# Patient Record
Sex: Female | Born: 1955
Health system: Southern US, Community
[De-identification: ages and names within clinical notes are randomized; demographics above are authoritative.]

## PROBLEM LIST (undated history)

## (undated) DIAGNOSIS — F419 Anxiety disorder, unspecified: Secondary | ICD-10-CM

## (undated) DIAGNOSIS — I10 Essential (primary) hypertension: Secondary | ICD-10-CM

## (undated) DIAGNOSIS — M1990 Unspecified osteoarthritis, unspecified site: Secondary | ICD-10-CM

## (undated) DIAGNOSIS — F329 Major depressive disorder, single episode, unspecified: Secondary | ICD-10-CM

## (undated) DIAGNOSIS — G629 Polyneuropathy, unspecified: Secondary | ICD-10-CM

## (undated) DIAGNOSIS — F32A Depression, unspecified: Secondary | ICD-10-CM

## (undated) DIAGNOSIS — E785 Hyperlipidemia, unspecified: Secondary | ICD-10-CM

## (undated) DIAGNOSIS — Z8679 Personal history of other diseases of the circulatory system: Secondary | ICD-10-CM

## (undated) DIAGNOSIS — I679 Cerebrovascular disease, unspecified: Secondary | ICD-10-CM

## (undated) DIAGNOSIS — I6523 Occlusion and stenosis of bilateral carotid arteries: Secondary | ICD-10-CM

## (undated) DIAGNOSIS — E11610 Type 2 diabetes mellitus with diabetic neuropathic arthropathy: Secondary | ICD-10-CM

## (undated) DIAGNOSIS — M797 Fibromyalgia: Secondary | ICD-10-CM

## (undated) DIAGNOSIS — Z8542 Personal history of malignant neoplasm of other parts of uterus: Secondary | ICD-10-CM

## (undated) HISTORY — DX: Hyperlipidemia, unspecified: E78.5

## (undated) HISTORY — DX: Personal history of malignant neoplasm of other parts of uterus: Z85.42

## (undated) HISTORY — DX: Cerebrovascular disease, unspecified: I67.9

## (undated) HISTORY — DX: Personal history of other diseases of the circulatory system: Z86.79

## (undated) HISTORY — DX: Essential (primary) hypertension: I10

## (undated) HISTORY — DX: Occlusion and stenosis of bilateral carotid arteries: I65.23

## (undated) HISTORY — PX: COLONOSCOPY: SHX174

## (undated) HISTORY — PX: KNEE ARTHROSCOPY: SUR90

## (undated) HISTORY — PX: REPLACEMENT TOTAL KNEE BILATERAL: SUR1225

---
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
## 1976-05-29 HISTORY — PX: TUBAL LIGATION: SHX77

## 1994-05-29 HISTORY — PX: THYROID CYST EXCISION: SHX2511

## 1998-05-29 HISTORY — PX: ABDOMINAL HYSTERECTOMY: SHX81

## 1998-05-29 HISTORY — PX: KNEE ARTHROSCOPY: SHX127

## 2000-02-23 ENCOUNTER — Encounter: Payer: Self-pay | Admitting: Gynecology

## 2000-02-23 ENCOUNTER — Ambulatory Visit (HOSPITAL_COMMUNITY): Admission: RE | Admit: 2000-02-23 | Discharge: 2000-02-23 | Payer: Self-pay | Admitting: Gynecology

## 2000-03-01 ENCOUNTER — Encounter (INDEPENDENT_AMBULATORY_CARE_PROVIDER_SITE_OTHER): Payer: Self-pay | Admitting: Specialist

## 2000-03-01 ENCOUNTER — Ambulatory Visit (HOSPITAL_COMMUNITY): Admission: RE | Admit: 2000-03-01 | Discharge: 2000-03-01 | Payer: Self-pay | Admitting: Gynecology

## 2000-04-04 ENCOUNTER — Ambulatory Visit: Admission: RE | Admit: 2000-04-04 | Discharge: 2000-04-04 | Payer: Self-pay | Admitting: Gynecology

## 2000-04-17 ENCOUNTER — Inpatient Hospital Stay (HOSPITAL_COMMUNITY): Admission: RE | Admit: 2000-04-17 | Discharge: 2000-04-19 | Payer: Self-pay | Admitting: Gynecology

## 2000-04-17 ENCOUNTER — Encounter (INDEPENDENT_AMBULATORY_CARE_PROVIDER_SITE_OTHER): Payer: Self-pay

## 2000-04-24 ENCOUNTER — Ambulatory Visit: Admission: RE | Admit: 2000-04-24 | Discharge: 2000-04-24 | Payer: Self-pay | Admitting: Gynecology

## 2000-05-04 ENCOUNTER — Inpatient Hospital Stay (HOSPITAL_COMMUNITY): Admission: AD | Admit: 2000-05-04 | Discharge: 2000-05-04 | Payer: Self-pay | Admitting: Gynecology

## 2000-05-07 ENCOUNTER — Inpatient Hospital Stay (HOSPITAL_COMMUNITY): Admission: AD | Admit: 2000-05-07 | Discharge: 2000-05-07 | Payer: Self-pay | Admitting: Obstetrics & Gynecology

## 2000-05-30 ENCOUNTER — Ambulatory Visit: Admission: RE | Admit: 2000-05-30 | Discharge: 2000-05-30 | Payer: Self-pay | Admitting: Gynecology

## 2000-09-24 ENCOUNTER — Emergency Department (HOSPITAL_COMMUNITY): Admission: EM | Admit: 2000-09-24 | Discharge: 2000-09-25 | Payer: Self-pay | Admitting: Emergency Medicine

## 2000-11-13 ENCOUNTER — Ambulatory Visit: Admission: RE | Admit: 2000-11-13 | Discharge: 2000-11-13 | Payer: Self-pay | Admitting: Gynecology

## 2000-11-13 ENCOUNTER — Other Ambulatory Visit: Admission: RE | Admit: 2000-11-13 | Discharge: 2000-11-13 | Payer: Self-pay | Admitting: Gynecology

## 2001-05-29 HISTORY — PX: CARPAL TUNNEL RELEASE: SHX101

## 2001-09-05 ENCOUNTER — Ambulatory Visit (HOSPITAL_COMMUNITY): Admission: RE | Admit: 2001-09-05 | Discharge: 2001-09-05 | Payer: Self-pay | Admitting: Orthopaedic Surgery

## 2001-09-24 ENCOUNTER — Ambulatory Visit (HOSPITAL_COMMUNITY): Admission: RE | Admit: 2001-09-24 | Discharge: 2001-09-24 | Payer: Self-pay | Admitting: Orthopaedic Surgery

## 2001-12-12 ENCOUNTER — Ambulatory Visit (HOSPITAL_COMMUNITY): Admission: RE | Admit: 2001-12-12 | Discharge: 2001-12-12 | Payer: Self-pay | Admitting: Orthopaedic Surgery

## 2001-12-17 ENCOUNTER — Encounter (HOSPITAL_COMMUNITY): Admission: RE | Admit: 2001-12-17 | Discharge: 2002-01-16 | Payer: Self-pay | Admitting: Orthopaedic Surgery

## 2002-01-18 ENCOUNTER — Encounter: Payer: Self-pay | Admitting: Emergency Medicine

## 2002-01-18 ENCOUNTER — Emergency Department (HOSPITAL_COMMUNITY): Admission: EM | Admit: 2002-01-18 | Discharge: 2002-01-18 | Payer: Self-pay | Admitting: Emergency Medicine

## 2002-06-26 ENCOUNTER — Encounter: Payer: Self-pay | Admitting: Emergency Medicine

## 2002-06-26 ENCOUNTER — Emergency Department (HOSPITAL_COMMUNITY): Admission: EM | Admit: 2002-06-26 | Discharge: 2002-06-26 | Payer: Self-pay | Admitting: Emergency Medicine

## 2003-12-05 ENCOUNTER — Emergency Department (HOSPITAL_COMMUNITY): Admission: EM | Admit: 2003-12-05 | Discharge: 2003-12-05 | Payer: Self-pay | Admitting: Emergency Medicine

## 2004-03-03 ENCOUNTER — Ambulatory Visit (HOSPITAL_COMMUNITY): Admission: RE | Admit: 2004-03-03 | Discharge: 2004-03-03 | Payer: Self-pay | Admitting: Family Medicine

## 2004-06-10 ENCOUNTER — Ambulatory Visit (HOSPITAL_COMMUNITY): Admission: RE | Admit: 2004-06-10 | Discharge: 2004-06-10 | Payer: Self-pay | Admitting: Pediatrics

## 2004-06-22 ENCOUNTER — Ambulatory Visit (HOSPITAL_COMMUNITY): Admission: RE | Admit: 2004-06-22 | Discharge: 2004-06-22 | Payer: Self-pay | Admitting: Pediatrics

## 2004-07-11 ENCOUNTER — Inpatient Hospital Stay (HOSPITAL_COMMUNITY): Admission: RE | Admit: 2004-07-11 | Discharge: 2004-07-14 | Payer: Self-pay | Admitting: Orthopedic Surgery

## 2004-08-01 ENCOUNTER — Encounter (HOSPITAL_COMMUNITY): Admission: RE | Admit: 2004-08-01 | Discharge: 2004-08-31 | Payer: Self-pay | Admitting: Orthopedic Surgery

## 2004-08-09 ENCOUNTER — Emergency Department (HOSPITAL_COMMUNITY): Admission: EM | Admit: 2004-08-09 | Discharge: 2004-08-09 | Payer: Self-pay | Admitting: Emergency Medicine

## 2004-08-10 ENCOUNTER — Ambulatory Visit: Payer: Self-pay | Admitting: Psychiatry

## 2004-09-02 ENCOUNTER — Inpatient Hospital Stay (HOSPITAL_COMMUNITY): Admission: RE | Admit: 2004-09-02 | Discharge: 2004-09-06 | Payer: Self-pay | Admitting: Orthopedic Surgery

## 2004-09-26 ENCOUNTER — Encounter (HOSPITAL_COMMUNITY): Admission: RE | Admit: 2004-09-26 | Discharge: 2004-10-26 | Payer: Self-pay | Admitting: Orthopedic Surgery

## 2004-12-11 ENCOUNTER — Emergency Department (HOSPITAL_COMMUNITY): Admission: EM | Admit: 2004-12-11 | Discharge: 2004-12-11 | Payer: Self-pay | Admitting: Emergency Medicine

## 2005-04-29 ENCOUNTER — Observation Stay (HOSPITAL_COMMUNITY): Admission: EM | Admit: 2005-04-29 | Discharge: 2005-04-29 | Payer: Self-pay | Admitting: Emergency Medicine

## 2005-05-04 ENCOUNTER — Ambulatory Visit (HOSPITAL_COMMUNITY): Admission: RE | Admit: 2005-05-04 | Discharge: 2005-05-04 | Payer: Self-pay | Admitting: Pediatrics

## 2005-05-04 ENCOUNTER — Ambulatory Visit: Payer: Self-pay | Admitting: Cardiology

## 2005-05-29 HISTORY — PX: HERNIA REPAIR: SHX51

## 2005-05-29 HISTORY — PX: CHOLECYSTECTOMY: SHX55

## 2005-09-29 ENCOUNTER — Ambulatory Visit (HOSPITAL_COMMUNITY): Admission: RE | Admit: 2005-09-29 | Discharge: 2005-09-29 | Payer: Self-pay | Admitting: General Surgery

## 2005-10-11 ENCOUNTER — Encounter (HOSPITAL_COMMUNITY): Admission: RE | Admit: 2005-10-11 | Discharge: 2005-11-10 | Payer: Self-pay | Admitting: General Surgery

## 2005-10-30 ENCOUNTER — Observation Stay (HOSPITAL_COMMUNITY): Admission: RE | Admit: 2005-10-30 | Discharge: 2005-10-31 | Payer: Self-pay | Admitting: General Surgery

## 2005-10-30 ENCOUNTER — Encounter (INDEPENDENT_AMBULATORY_CARE_PROVIDER_SITE_OTHER): Payer: Self-pay | Admitting: *Deleted

## 2005-11-09 ENCOUNTER — Emergency Department (HOSPITAL_COMMUNITY): Admission: EM | Admit: 2005-11-09 | Discharge: 2005-11-09 | Payer: Self-pay | Admitting: Emergency Medicine

## 2006-05-10 ENCOUNTER — Observation Stay (HOSPITAL_COMMUNITY): Admission: EM | Admit: 2006-05-10 | Discharge: 2006-05-11 | Payer: Self-pay | Admitting: Emergency Medicine

## 2006-05-10 ENCOUNTER — Ambulatory Visit: Payer: Self-pay | Admitting: Internal Medicine

## 2006-06-29 ENCOUNTER — Ambulatory Visit (HOSPITAL_COMMUNITY): Admission: RE | Admit: 2006-06-29 | Discharge: 2006-06-29 | Payer: Self-pay | Admitting: General Surgery

## 2006-10-19 ENCOUNTER — Observation Stay (HOSPITAL_COMMUNITY): Admission: RE | Admit: 2006-10-19 | Discharge: 2006-10-20 | Payer: Self-pay | Admitting: General Surgery

## 2006-12-03 ENCOUNTER — Ambulatory Visit (HOSPITAL_COMMUNITY): Admission: RE | Admit: 2006-12-03 | Discharge: 2006-12-03 | Payer: Self-pay | Admitting: Family Medicine

## 2006-12-07 ENCOUNTER — Ambulatory Visit (HOSPITAL_COMMUNITY): Admission: RE | Admit: 2006-12-07 | Discharge: 2006-12-07 | Payer: Self-pay | Admitting: General Surgery

## 2007-02-06 ENCOUNTER — Ambulatory Visit (HOSPITAL_COMMUNITY): Admission: RE | Admit: 2007-02-06 | Discharge: 2007-02-06 | Payer: Self-pay | Admitting: Family Medicine

## 2007-02-13 ENCOUNTER — Ambulatory Visit (HOSPITAL_COMMUNITY): Admission: RE | Admit: 2007-02-13 | Discharge: 2007-02-13 | Payer: Self-pay | Admitting: Family Medicine

## 2007-08-21 IMAGING — CR DG CHEST 1V PORT
1 series · 1 of 1 positions shown · non-contrast
Comparison: 12/05/03.

CLINICAL DATA: Atrial fibrillation. 
 PORTABLE CHEST - 1 VIEW - 04/28/05:

[view not recorded]
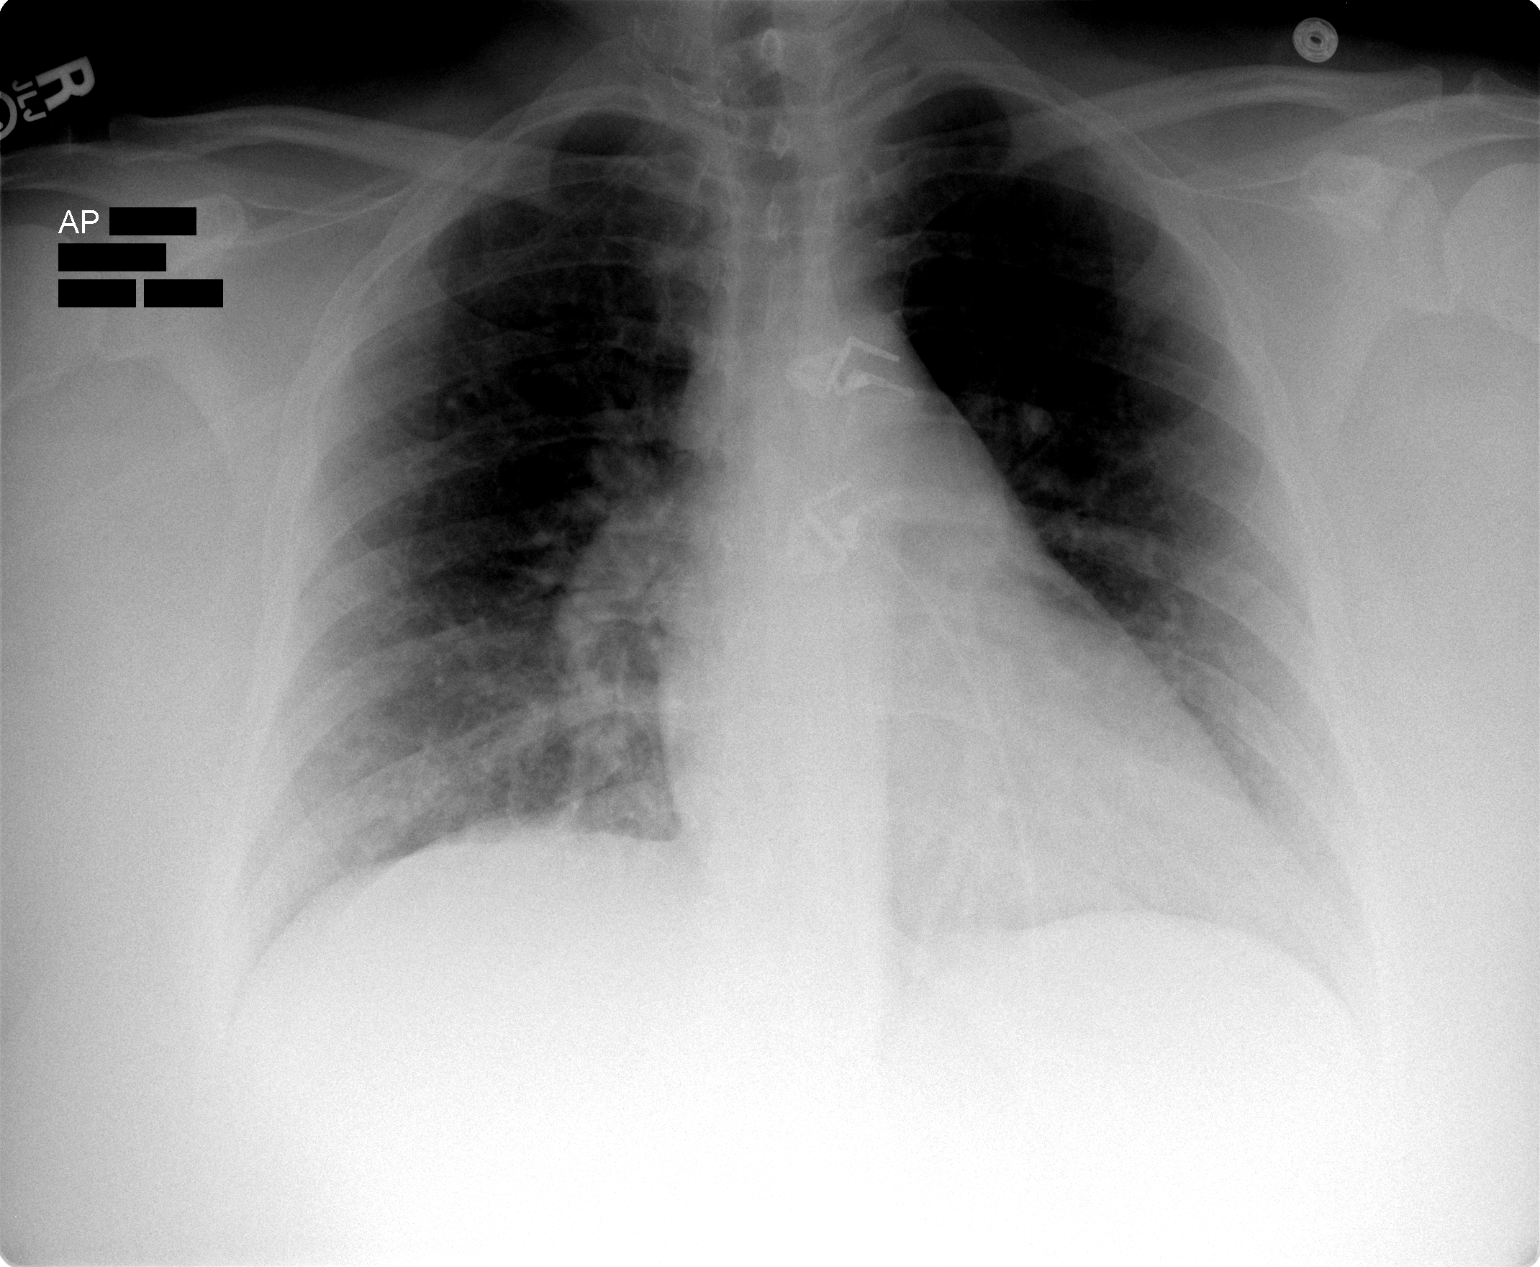

[1 of 1 positions shown; findings below may reference images not displayed]

FINDINGS: AP film at 1141 hours shows a stable cardiopericardial silhouette which is upper limits of normal.  There is mild vascular congestion without overt edema or focal consolidation.  Telemetry leads overlie the chest.
IMPRESSION: Borderline cardiomegaly with mild vascular congestion.

## 2007-11-04 ENCOUNTER — Ambulatory Visit (HOSPITAL_COMMUNITY): Admission: RE | Admit: 2007-11-04 | Discharge: 2007-11-04 | Payer: Self-pay | Admitting: Pediatrics

## 2009-07-08 ENCOUNTER — Ambulatory Visit (HOSPITAL_COMMUNITY): Admission: RE | Admit: 2009-07-08 | Discharge: 2009-07-08 | Payer: Self-pay | Admitting: Surgery

## 2010-10-14 NOTE — Consult Note (Signed)
[facility]  Patient:    [name], [name]                 MRN: [id number] Proc. Date: 04/24/00 Adm. Date:  [id number] Attending:  Jeannette Corpus CC:         Roseanna Rainbow, M.D.  Leatha Gilding. Mezer, M.D.  Telford Nab, R.N.   Consultation Report  HISTORY:  [age]-year-old white female returns following TAH/BSO a week ago for a grade 1 endometrial carcinoma.  Final pathology showed this was superficial and noninvasive (stage IA).  Patient has had an uncomplicated postoperative course.  She denies any nausea or vomiting.  Her appetite is good.  She is having some intermittent constipation.  She has had no wound problems.  PHYSICAL EXAMINATION  ABDOMEN:  The abdomen is soft and nontender.  Midline incision is healing well.  Staples are removed and Steri-Strips are applied.  IMPRESSION:  Stage IA, grade 1 endometrial carcinoma.  The patient is having considerable hot flushes and I think with the low risk of recurrence, the patient will be given a prescription for Premarin 0.9 mg daily.  She is given a new prescription for Percocet (30) for postoperative pain.  She will return in one week to have her subcutaneous retention sutures removed. DD:  04/24/00 TD:  04/25/00 Job: [id number]

## 2010-10-14 NOTE — Consult Note (Signed)
[facility]  Patient:    [name], [name]                   MRN: [id number] Proc. Date: 11/13/00 Adm. Date:  [id number] Attending:  Jeannette Corpus CC:         Leatha Gilding. Mezer, M.D.  Roseanna Rainbow, M.D.  Telford Nab, R.N.  Roylene Reason. Lisette Grinder, M.D. [address] [place], [place]  [address]   Consultation Report  HISTORY OF PRESENT ILLNESS:  [age]-year-old white female returns for continuing followup of a stage IA, grade 1 endometrial cancer, undergoing initial surgery in November of 2001.  Since her last visit, she has done well. She denies any GI or GU symptoms, has no pelvic pain, pressure, vaginal bleeding or discharge.  She is working full-time.  She failed to make her followup appointment with Dr. Lisette Grinder three months ago.  REVIEW OF SYSTEMS:  Essentially negative.  FAMILY HISTORY AND SOCIAL HISTORY:  Reviewed and unchanged from previous notations.  PHYSICAL EXAMINATION:  VITAL SIGNS:  Weight 350 pounds.  Blood pressure 110/78.  GENERAL:  The patient is an obese white female in no acute distress.  HEENT:  Negative.  NECK:  Supple without thyromegaly.  There is no supraclavicular or inguinal adenopathy.  ABDOMEN:  Soft, obese, nontender.  No mass, organomegaly, ascites or herniae are noted.  Her midline incision is well-healed.  PELVIC:  EGBUS normal.  Vagina is clean, well-supported.  No lesions are noted.  Bimanual and rectovaginal exam revealed no mass, induration or nodularity.  IMPRESSION:  Stage IA, grade 1 endometrial cancer, no evidence of recurrent disease.  PLAN:  Pap smears are obtained today.  The patient will return to see her primary gynecologist, Dr. Roylene Reason. Lisette Grinder, in three months and return to see me in six months. DD:  11/13/00 TD:  11/14/00 Job: [id number]

## 2010-10-14 NOTE — Group Therapy Note (Signed)
   NAMEEVON, DEJARNETT                        ACCOUNT NO.:  [id number]   MEDICAL RECORD NO.:  [id number]                  PATIENT TYPE:   LOCATION:                                       FACILITY:   PHYSICIAN:  Fredirick Maudlin, M.D.              DATE OF BIRTH:   DATE OF PROCEDURE:  DATE OF DISCHARGE:                                   PROGRESS NOTE   ELECTROCARDIOGRAM:  (Time 1138 on January 18, 2002)  The rhythm is sinus rhythm with a rate of 70. There are non-specific T wave  abnormalities which are diffuse.   INTERPRETATION:  Minimally abnormal electrocardiogram.                                               Fredirick Maudlin, M.D.    ELH/MEDQ  D:  01/19/2002  T:  01/19/2002  Job:  [id number]

## 2010-10-14 NOTE — Op Note (Signed)
NAMEJAZZMINE, [name]            ACCOUNT NO.:  [id number]   MEDICAL RECORD NO.:  [id number]          PATIENT TYPE:  AMB   LOCATION:  DAY                           FACILITY:  [facility]   PHYSICIAN:  Dalia Heading, M.D.  DATE OF BIRTH:  [birth year]   DATE OF PROCEDURE:  DATE OF DISCHARGE:                               OPERATIVE REPORT   PREOPERATIVE DIAGNOSIS:  Recurrent incisional hernia.   POSTOPERATIVE DIAGNOSIS:  Recurrent incisional hernia.   OPERATION PERFORMED:  Recurrent incisional herniorrhaphy with mesh.   SURGEON:  Dalia Heading, M.D.   ANESTHESIA:  General endotracheal.   INDICATIONS:  The patient is a 55 year old, obese white female who  presents with a recurrent incisional hernia.  This is occurring in the  upper portion of the abdomen to the left of the previous hernia repair.  The risks and benefits of the procedure including, bleeding, infection,  and the possible recurrence of the hernia were fully explained to the  patient.  He gave informed consent.   PROCEDURE:  The patient was placed in supine position.  After induction  of general endotracheal anesthesia, the abdomen was prepped and draped  using the usual sterile technique with  Betadine.  Surgical site  confirmation was performed.   A  midline incision was made through the previous surgical scar. This  scar was excised without difficulty.  The dissection was taken down to  the hernia.  The previous polypropylene mesh repair was found.  It  appeared that the left side muscle layer and fascia had pulled away from  the mesh.  The large hernia sac was found and this was excised without  difficulty.  The left sided fascia was found.  A 3 x 5 cm defect was  closed using polypropylene mesh.  It was secured into place with both a  running O-Novofil suture as well as interrupted O-Prolene sutures.  A  #10 flat Jackson-Pratt drain was placed over this region and brought out  through a separate stab wound inferior  to the midline incision.  The  subcutaneous layer was reapproximated using 2-0 and 3-0 Vicryl in  interrupted sutures.  The skin was closed using staples.  A 0.5%  Sensorcaine was installed in the surrounding wound.  Betadine ointment  and dry sterile dressing were applied.   All tape and needle counts were correct at the end of the procedure.  The patient was extubated in the operating room and went back to  recovery room awake and in stable condition.   COMPLICATIONS:  None.   SPECIMENS:  None.   ESTIMATED BLOOD LOSS:  Less than 100 ml.   DRAINS:  Jackson-Pratt drain in the subcutaneous tissue.      Dalia Heading, M.D.  Electronically Signed     MAJ/MEDQ  D:  10/19/2006  T:  10/19/2006  Job:  [id number]   cc:   Dalia Heading, M.D.  Fax: [phone]

## 2010-10-14 NOTE — Consult Note (Signed)
[facility]  Patient:    [name], [name]                 MRN: [id number] Proc. Date: 05/30/00 Adm. Date:  [id number] Attending:  Jeannette Corpus CC:         Leatha Gilding. Mezer, M.D.  Roseanna Rainbow, M.D.  Telford Nab, R.N.  Roylene Reason. Lisette Grinder, M.D., [facility], Dept. of OB/GYN, [place], [place] [address]                          Consultation Report  HISTORY:  [age]-year-old white female returns for six-week postoperative checkup, having undergone a TAH/BSO for grade 1 endometrial carcinoma.  Final pathology showed that she had a superficial and essentially noninvasive adenocarcinoma (stage IA).  Despite her obesity, she had an uncomplicated postoperative course and had good wound healing.  Since her last visit, she has done well.  She has no pain or pressure. Physical activity and energy level are nearly normal.  REVIEW OF SYSTEMS:  Review of systems otherwise negative.  PHYSICAL EXAMINATION  GENERAL:  Physical exam reveals an obese white female in no acute distress.  ABDOMEN:  The abdomen is obese, soft, nontender.  No mass, organomegaly, ascites or herniae are noted.  Her incisions are healing well.  There are two areas of eschar present from very superficial separation.  PELVIC:  EG/BUS normal.  Vagina is clean, well-supported.  Cuff is healing well.  Bimanual and rectovaginal exam revealed no mass, induration or nodularity.  IMPRESSION:  Stage IA, grade 1 endometrial cancer.  We will have the patient return to see her primary gynecologist, Dr. Roylene Reason. Lisette Grinder, in three months and return to see me three months thereafter in followup. DD:  05/30/00 TD:  05/31/00 Job: [id number]

## 2010-10-14 NOTE — Consult Note (Signed)
[name], [name]            ACCOUNT NO.:  [id number]   MEDICAL RECORD NO.:  [id number]          PATIENT TYPE:  OBV   LOCATION:  A202                          FACILITY:  [facility]   PHYSICIAN:  Pricilla Riffle, MD, FACCDATE OF BIRTH:  12/15/1955   DATE OF CONSULTATION:  05/10/2006  DATE OF DISCHARGE:                                 CONSULTATION   REFERRING SERVICE:  [facility].   IDENTIFICATION:  [name] is a 55 year old woman who we are asked to  see regarding chest pain.   HISTORY:  The patient has no known history of coronary artery disease  and had a negative stress test in 1999, complaining of chest pain that  began last night.  It occurred in her shoulder blades and went to the  front of her chest, occasionally pleuritic, worse with moving, notes  moving furniture recently.  Denies PND.  No palpitations.  Seen by Dr.  Oneta Rack.  Labs:  Troponin 0.07.  Planned for admit.   The patient has been active, no change, no chest pain prior.   ALLERGIES:  None.   PAST MEDICAL HISTORY:  1. Diabetes, diagnosed in 2000.  Hemoglobin A1c in July was 9.62.  2. Dyslipidemia, on lovastatin 20 mg.  On last lipid panel in July,      her LDL was 139, HDL of 42, triglycerides of 195.  3. Tobacco use.  4. Hypertension.  5. __________.  6. Uterine CA, status post removal.  7. Paroxysmal atrial fibrillation; the patient says she senses, has      had 2 spells, last one this year.  8. DJD.  9. Depression.  10.Obesity.   SOCIAL HISTORY:  The patient is a Engineer, civil (consulting).  She has 3 children and smokes.   FAMILY HISTORY:  Positive for MI, significantly 3 relatives in their  22s.   REVIEW OF SYSTEMS:  All systems reviewed and negative to the above  problem except as noted.   PHYSICAL EXAM:  GENERAL:  On exam, the patient is in no acute distress,  pain noted with deep breath.  VITAL SIGNS:  Heart rate 80, blood pressure not taken, respiratory rate  16.  HEENT:  Normocephalic, atraumatic.   EOMI, PERRL.  Throat clear.  NECK:  JVP is normal.  No bruits.  No thyromegaly.  LUNGS:  Clear.  CARDIAC:  Regular rate and rhythm.  S1 and S2.  No S3, S4 or murmurs.  BACK:  Tender to percussion in left scapula.  ABDOMEN:  Benign.  EXTREMITIES:  Pulses 2+.  No lower extremity edema.  NEUROLOGIC:  Alert and oriented x3.  Cranial nerves II-XII grossly  intact.  Motor 5/5 throughout.   LABORATORY DATA:  Hemoglobin of 16, WBC of 9.9.  MB fraction of 2.3,  less than 1.  Troponin less than 0.05, 0.07.  BNP 105.   EKG:  Normal sinus rhythm at 61 beats per minute.   IMPRESSION:  The patient is a 55 year old female with history of  diabetes, hypertension, continued tobacco use, family history,  dyslipidemia, now with chest pain.  Pain atypical for cardiac ischemia,  appears more  musculoskeletal, but troponin slightly positive at 0.07.   PLAN/RECOMMENDATION:  Continue to follow, treat pain.  If no continued  trend positive, stress Myoview; if continues to increase, cath.  Counseled on weight, tobacco and diabetes.  Check lipids.  With history  of paroxysmal atrial fibrillation, will need to follow.  Consider echo,  not now.      Pricilla Riffle, MD, [facility]  Electronically Signed     PVR/MEDQ  D:  05/10/2006  T:  05/11/2006  Job:  [id number]

## 2010-10-14 NOTE — H&P (Signed)
NAMEYURIANA, [name]            ACCOUNT NO.:  [id number]   MEDICAL RECORD NO.:  [id number]          PATIENT TYPE:  INP   LOCATION:  A213                          FACILITY:  [facility]   PHYSICIAN:  Scott A. Gerda Diss, MD    DATE OF BIRTH:  01-24-1956   DATE OF ADMISSION:  04/28/2005  DATE OF DISCHARGE:  LH                                HISTORY & PHYSICAL   CHIEF COMPLAINT:  Shortness of breath, fluttering of the heart.   HISTORY OF PRESENT ILLNESS:  This is a 55 year old white female who earlier  today felt like her heart was flip-flopping, then felt like it was running  fast.  She came to the hospital.  She states that she was checked and found  to be in atrial fibrillation with rapid ventricular response.  So she came  on here, to the ER to be further treated. While here in the ER they gave her  Cardizem that dropped her blood pressure but did block her down into a  junctional rhythm.  The patient denied any chest tightness, pressure or  pain; felt a little bit short of breath, slightly nauseated; but no  vomiting, no diarrhea, no sweats or chills.  Denies any sharp chest pains or  severe chest tightness.  She states that she gets a little short of breath  with activity, but she states that that is normal for her.  She denies PND,  excessive swelling in the legs.  She states that her energy level overall is  good.   PAST MEDICAL HISTORY:  Diabetes, obesity, stress anxiety, and sleep  disorder.  Also includes 1 episode of atrial fibrillation with rapid  ventricular response.  She states that she had normal workup, at that time,  including a thallium test.  It should be noted that the initial EKG showed  some ST segment depression in lead 2.  Chest x-ray showed vascular  congestion and cardiomegaly.   SOCIAL HISTORY:  Smokes.   FAMILY HISTORY:  Heart disease in dad, occurred in the 59s.   PHYSICAL EXAMINATION:  HEENT:  Benign.  NECK:  No abnormal JVD.  CHEST:  CTA.  No crackles.  HEART:  Regular currently.  ABDOMEN:  Soft.  EXTREMITIES:  No edema.  SKIN:  Warm and dry.   Cardiac enzymes negative.  MET-7 overall looks good.  CBC looks fine.  BNP  pending.   ASSESSMENT AND PLAN:  Atrial fibrillation with rapid ventricular response --  admit patient in observation, watch overnight, repeat EKG in the morning.  If she spontaneously converts there is nothing else needed to be done  currently.  We will check a BNP and also give her some Lasix, recheck BNP in  the morning.  Cardiac monitors two additional times.  If the patient  persists with this, will probably need echo and cardiac consult.  She has  seen [facility] in the past.      Scott A. Gerda Diss, MD  Electronically Signed     SAL/MEDQ  D:  04/29/2005  T:  04/29/2005  Job:  [id number]

## 2010-10-14 NOTE — Op Note (Signed)
[facility]  Patient:    [name], [name] Visit Number: [id number] MRN: [id number]          Service Type: DSU Location: DAY Attending Physician:  Windle Guard Dictated by:   Darreld Mclean, M.D. Proc. Date: 09/05/01 Admit Date:  09/05/2001                             Operative Report  PREOPERATIVE DIAGNOSIS:   Left carpal tunnel syndrome.  POSTOPERATIVE DIAGNOSIS:  Left carpal tunnel syndrome.  OPERATION:  Release of left volar carpal ligament, saline neurolysis, epineurotomy, left median nerve.  SURGEON:  Darreld Mclean, M.D.  ASSISTANT:  Candace Cruise, P.A.-C.  ANESTHESIA:  Bier block.  TOURNIQUET TIME:  Please refer to anesthesia record for tourniquet time.  DRAINS:  None.  SPLINTS:  Volar plasty splint.  INDICATIONS: The patient is a 55 year old female with pain and tenderness in the left median nerve.  Nerve conduction studies were positive for carpal tunnel syndrome on the left.  The patient has not improved with conservative treatment.  Surgery is now recommended.  Risks and opponens of the procedure have been discussed preoperatively.  DESCRIPTION OF PROCEDURE: The patient was placed supine on the operating room table with hand table attached.  The patient was given bier block anesthesia. Once this was obtained, the patient was prepped and draped in the usual manner.  Overlying incision was made and with careful dissection the median nerve was identified proximally.  A vessel loop was placed around the nerve. A groove director was then placed within the carpal tunnel space.  Volar carpal ligament was then incised.  The nerve was obviously decompressed. Retinaculum cut proximally under the skin.  Saline neurolysis and epineurotomy carried out.  Specimen of volar carpal ligament sent to pathology.  Nerve inspection no apparent injury.  Wound reapproximated with 3-0 nylon interrupted vertical mattress manner.  Sterile dressing  applied.  Bulky dressing applied.  Sheet cotton applied.  Sheet cotton cut dorsally.  Volar plasty splint applied.  Ace bandage applied loosely.  DISPOSITION:  The patient was given Vicodin ES for pain.  See the patient in the office in approximately 10 days to two weeks.  She is to elevate her hand and move her fingers.  If she has any difficulty she is to contact me through the office or hospital beeper system. Dictated by:   Darreld Mclean, M.D. Attending Physician:  Windle Guard DD:  09/05/01 TD:  09/05/01 Job: [id number]

## 2010-10-14 NOTE — Op Note (Signed)
[name], [name]            ACCOUNT NO.:  [id number]   MEDICAL RECORD NO.:  [id number]          PATIENT TYPE:  OBV   LOCATION:  A304                          FACILITY:  [facility]   PHYSICIAN:  Dalia Heading, M.D.  DATE OF BIRTH:  07-14-1955   DATE OF PROCEDURE:  10/30/2005  DATE OF DISCHARGE:                                 OPERATIVE REPORT   PREOPERATIVE DIAGNOSIS:  Chronic cholecystitis, incisional hernia.   POSTOPERATIVE DIAGNOSIS:  Chronic cholecystitis, incisional hernia.   PROCEDURE:  Laparoscopic cholecystectomy, incisional herniorrhaphy with  mesh.   SURGEON:  Dr. Franky Macho.   ASSISTANT:  Dr. Arna Snipe.   ANESTHESIA:  General endotracheal.   INDICATIONS:  The patient is a 55 year old morbidly obese white female who  presents with both biliary colic secondary to chronic cholecystitis, and an  upper midline incisional hernia.  The risks and benefits of both procedures,  including bleeding, infection, recurrence of the hernia, hepatobiliary  injury, and cardiopulmonary difficulties were fully explained to the  patient, who gave informed consent.   PROCEDURE NOTE:  The patient was placed in the supine position.  After  induction of general endotracheal anesthesia, the abdomen was prepped and  draped, using the usual sterile technique with Betadine.  Surgical site  confirmation was performed.   A supraumbilical incision was made down to the fascia.  Veress needle was  introduced into the abdominal cavity and confirmation of placement was done  using the saline drop test.  The abdomen was then insufflated to 16 mmHg  pressure.  An 11-mm trocar was introduced into the abdominal cavity, under  direct visualization without difficulty.  The patient was placed in reversed  Trendelenburg position.  An additional 11-mm trocar was placed in the  epigastric region and 5-mm trocars were placed in the right upper quadrant  and right flank regions.  Liver was inspected  and noted to normal limits.  The gallbladder was retracted superiorly and laterally.  The dissection was  begun around the infundibulum of the gallbladder.  The cystic duct was first  identified.  Its juncture to the infundibulum was fully identified, and  clips placed proximally and distally on cystic duct, and cystic duct was  divided.  This was likewise done on the cystic artery.  The gallbladder then  freed away from the gallbladder fossa using Bovie electrocautery.  The  gallbladder was delivered through the epigastric trocar site using EndoCatch  bag without difficulty.  Gallbladder fossa was inspected.  No abnormal  bleeding or bile leakage was noted.  Surgicel was placed in the gallbladder  fossa.  All fluid and air were then evacuated from the abdominal cavity  prior to removal of the trocars.   Of note was the fact that the liver appeared to have fatty infiltration  present.  No discrete masses were noted.   Next, an incisional herniorrhaphy with mesh was performed.  The  supraumbilical incision was extended superiorly.  The dissection was taken  down to the fascia.  The patient was noted to have a 3 cm hernia defect  along the inner portion of the rectus  abdominis.  This was opened and the  peritoneal cavity was entered into.  Omentum was freed away sharply and  bluntly from the underside of the fascia.  A 3 x 6 cm piece of polypropylene  mesh was then secured circumferentially to the fascia using an 0 Prolene  running suture.  Single interrupted 0 Prolene sutures were also placed to  secure the repair.  The defect ultimately ended up being approximately 3 x 4  cm in size.  The subcutaneous layer was reapproximated using a 2-0 Vicryl in  interrupted sutures.  All skin incisions were closed using staples.  Betadine ointment and dry sterile dressings were applied.  Sensorcaine 0.5%  was instilled into the surrounding wounds.   All tape and needle counts were correct at the end  of the procedures.  The  patient was extubated in the operating room and went back to recovery room,  awake and in stable condition.   COMPLICATIONS:  None.   SPECIMEN:  Gallbladder.   BLOOD LOSS:  Minimal.      Dalia Heading, M.D.  Electronically Signed     MAJ/MEDQ  D:  10/30/2005  T:  10/30/2005  Job:  [id number]   cc:   Dalia Heading, M.D.  Fax: [phone]   Francoise Schaumann. Milford Cage DO, FAAP  Fax: [phone]

## 2010-10-14 NOTE — Consult Note (Signed)
[facility]  Patient:    [name], [name]                     MRN: [id number] Proc. Date: 04/04/00 Attending:  Rande Brunt. Clarke-Pearson, M.D. CC:         Leatha Gilding. Mezer, M.D.  Roseanna Rainbow, M.D.  Telford Nab, R.N.   Consultation Report  [age]-year-old white married female, referred by Dr. Leatha Gilding. Mezer for evaluation and management of a newly diagnosed grade 1 endometrial carcinoma. The patient had some abnormal bleeding, ultimately undergoing a hysteroscopic D&C on October 4th.  The findings showed an endometrial adenocarcinoma, grade 1.  Endocervical curettage was negative.  The patient has had a pelvic ultrasound which shows no other abnormalities and no free fluid. Transabdominal ultrasound was limited because of the patients obesity.  She has no other significant gynecologic history.  MEDICAL HISTORY:  Atrial fibrillation, October 1999, with spontaneous conversion.  Patient also has diabetes.  SURGERY:  Arthroscopies x 5, bilateral tubal ligation, resection of thyroid nodule.  CURRENT MEDICATIONS 1. Cardizem 120 mg daily. 2. Paxil 40 mg daily. 3. Ibuprofen 800 mg daily. 4. Glyburide 5 mg daily.  Her blood sugars run between 81 and 150.  DRUG ALLERGIES:  CODEINE.  FAMILY HISTORY:  Family history is positive for breast cancer in a paternal grandfather and paternal aunt.  She has a grandmother who had lung and pancreatic cancer.  SOCIAL HISTORY:  The patient is married.  She smokes one pack per day.  REVIEW OF SYSTEMS:  Otherwise negative.  PHYSICAL EXAMINATION  VITAL SIGNS:  Blood pressure 140/74, pulse 84, respiratory rate 24.  Height 5 feet 10 inches.  Weight in excess of 350 pounds.  GENERAL:  The patient is an obese, pleasant white female in no acute distress.  HEENT:  Negative.  NECK:  Supple, without thyromegaly.  LYMPHATICS:  There is no supraclavicular or inguinal adenopathy.  ABDOMEN:  The  abdomen is massively obese, soft and nontender, with no mass, organomegaly, ascites or herniae noted.  She does have a large panniculus.  PELVIC:  EG/BUS normal.  Vagina is clean, well-supported.  Cervix appears normal.  A tenaculum was placed on the cervix and there is very little descent noted.  Bimanual examination does not reveal any fixation of the uterus or any adnexal masses.  The size of the uterus is difficult to outline due to the patients obesity.  RECTAL:  Rectal exam confirms.  IMPRESSION:  Grade 1 endometrial cancer in a massively obese patient.  PLAN:  I had a lengthy discussion with patient and her husband regarding management options; these would include surgical intervention versus management with Megace.  After considering the options, I believe it would be most appropriate to proceed with total abdominal hysterectomy and bilateral salpingo-oophorectomy.  We all understand and accept the risks involved with this sort of surgery in this patient with significant obesity, yet, nonetheless, long-term use of Megace in a young woman may be very detrimental as well.  I believe that this surgery can be done safely as long as we have adequate exposure with deep retractors.  The risks of surgery including hemorrhage, infection, injury to adjacent viscera and thromboembolic complications were outlined to the patient and her husband.  We reemphasized the potential problems with wound healing and the need for subcutaneous retention sutures postoperatively; the patient is in agreement with this and we will coordinate surgery with Dr. Roseanna Rainbow during Dr. Valene Bors absence. DD:  04/04/00 TD:  04/05/00 Job: [id number]

## 2010-10-14 NOTE — Op Note (Signed)
[facility]  Patient:    [name], [name] Visit Number: [id number] MRN: [id number]          Service Type: [facility] Location: [facility] Attending Physician:  Windle Guard Dictated by:   Darreld Mclean, M.D. Admit Date:  12/17/2001                             Operative Report  PREOPERATIVE DIAGNOSIS:  Tear of medial meniscus of the right knee.  POSTOPERATIVE DIAGNOSIS:  Tear of medial meniscus of the right knee.  PROCEDURE:  Operative arthroscopy with partial medial meniscectomy, right knee.  TOURNIQUET TIME:  24 minutes.  DRAINS:  None.  SURGEON:  Darreld Mclean, M.D.  ASSISTANT: Candace Cruise, P.A.-C.  INDICATION:  The patient is a 55 year old with pain and tenderness in the right knee.  She has an abnormal MRI showing tear of the medial meniscus, not improved with conservative treatment.  Surgery was recommended.  The risks and imponderables were explained.  She has had arthroscopy on the left knee, and she understands the procedure.  OPERATIVE FINDINGS:  The suprapatellar pouch is negative.  Undersurface of the patella had grade 2-3 changes.  Medially there was a tear of the medial meniscus posterior horn, grade 2 changes of medial condyle and tibial plateau. Anterior cruciate intact.  Laterally there was fraying of the lateral meniscus with grade 2 changes.  No loose bodies.  DESCRIPTION OF PROCEDURE:  The patient given general anesthesia, laid supine on the operating room table, leg holder, and tourniquet placed deflated on the right upper thigh.  The leg was elevated, wrapped circumferentially in an Esmarch bandage.  Tourniquet inflated to 300 mmHg, Esmarch bandage removed. At this point we verified that we had the correct patient, [name], and we were going to do a right knee arthroscopy and planned to do a medial meniscectomy depending on pathology.  The procedure then continued.  We made an incision and the inflow cannula was inserted,  lactated Ringers was instilled in the knee by an infusion pump.  Arthroscope inserted laterally and the knee systematically examined.  Please see findings above.  Preliminary pictures were taken.  The tear was identified medially, and the lateral side showed significant fraying but no appreciable tear.  A probe was inserted, and the meniscal shaver was inserted and meniscal punches.  A good, smooth contour was obtained of the meniscus medially.  Laterally she had fraying, and these were debrided with the meniscal shaver.  The knee was systematically re-examined and no pathology found.  The wound was irrigated with _____ lactated Ringers and the wound was reapproximated using 3-0 nylon interrupted vertical mattress.  Marcaine 0.25% instilled in each portal.  Tourniquet time 24 minutes.  Tourniquet deflated, a sterile bulky dressing applied.  The patient tolerated the procedure well.  A prescription for Vicodin ES was given for pain.  I will see the patient in my office in approximately 10 days to two weeks.  Physical therapy has been scheduled.  If there are any difficulties, contact the office as soon as possible. Dictated by:   Darreld Mclean, M.D. Attending Physician:  Windle Guard DD:  12/12/01 TD:  12/17/01 Job: [id number]

## 2010-10-14 NOTE — H&P (Signed)
[name], [name]            ACCOUNT NO.:  [id number]   MEDICAL RECORD NO.:  [id number]          PATIENT TYPE:  AMB   LOCATION:  DAY                           FACILITY:  [facility]   PHYSICIAN:  Dalia Heading, M.D.  DATE OF BIRTH:  July 06, 1955   DATE OF ADMISSION:  DATE OF DISCHARGE:  LH                              HISTORY & PHYSICAL   CHIEF COMPLAINT:  Incisional hernia.   HISTORY OF PRESENT ILLNESS:  The patient is a 55 year old white female  who was referred for evaluation and treatment of a recurrent incisional  hernia.  She underwent a laparoscopic cholecystectomy and incisional  herniorrhaphy with mes in June of 2007.  She has subsequently broken  down the midline incisional hernia repair and now presents for a  recurrent incisional herniorrhaphy.   PAST MEDICAL HISTORY:  Depression, morbid obesity, hypertension, insulin-  dependent diabetes mellitus, COPD.   PAST SURGICAL HISTORY:  As noted above, carpal tunnel release, bilateral  knee replacements, tubal ligation, hysterectomy, thyroidectomy.   CURRENT MEDICATIONS:  1. Paxil.  2. Glyburide.  3. Cardizem.  4. Vasotec.  5. Provigil.  6. Lantus and Humalog Insulin.   LABORATORY DATA:  No known drug allergies.   REVIEW OF SYSTEMS:  The patient smokes 1-2 packs of cigarettes a day.  She denies any alcohol use.  She denies any recent chest pain, MI, CVA,  or bleeding disorders.   PHYSICAL EXAMINATION:  GENERAL:  The patient is a morbidly obese white  female in no acute distress.  LUNGS:  Equal breath sounds bilaterally with occasional expiratory  wheezing.  HEART:  Regular rate and rhythm without S3, S4, or murmurs.  ABDOMEN:  Soft and nondistended.  She is tender in the left paramedian  space with a large hernia present.  No hepatosplenomegaly or masses are  noted.   IMPRESSION:  Recurrent incisional hernia.   PLAN:  The patient is scheduled for a recurrent incisional herniorrhaphy  with mesh on Oct 19, 2006.   The risks and benefits of the procedure  including bleeding, infection, cardiopulmonary difficulties, and the  possibility of recurrence of the hernia were fully explained to the  patient, who gave informed consent.      Dalia Heading, M.D.  Electronically Signed     MAJ/MEDQ  D:  09/20/2006  T:  09/20/2006  Job:  [id number]   cc:   [facility]   Francoise Schaumann. Milford Cage DO, FAAP  Fax: [phone]

## 2010-10-14 NOTE — Discharge Summary (Signed)
Michele, [name]            ACCOUNT NO.:  [id number]   MEDICAL RECORD NO.:  [id number]          PATIENT TYPE:  INP   LOCATION:  0469                         FACILITY:  [facility]   PHYSICIAN:  Ollen Gross, M.D.    DATE OF BIRTH:  1956-02-16   DATE OF ADMISSION:  09/02/2004  DATE OF DISCHARGE:  09/06/2004                                 DISCHARGE SUMMARY   ADMISSION DIAGNOSES:  1. Osteoarthritis of the right knee.  2. Diabetes mellitus.  3. Hypertension.  4. Obesity.  5. Depression.  6. Uterine cancer.  7. Bronchitis.  8. A history of paroxysmal atrial fibrillation in 2000.  9. Recent otitis media, treated with antibiotics preoperatively.     DISCHARGE DIAGNOSES:  1. Osteoarthritis, right knee, status post right total knee arthroplasty,      computer navigation.  2. Mild postoperative blood loss anemia.  Did not require transfusion.  3. Hyponatremia, improved.  4. Diabetes mellitus.  5. Hypertension.  6. Obesity.  7. Depression.  8. Uterine cancer.  9. Bronchitis.  10.A history of paroxysmal atrial fibrillation in 2000.  11.Recent otitis media, treated with antibiotics preoperatively.     PROCEDURE:  On September 02, 2004, right total knee arthroplasty with computer  navigation.   SURGEON:  Ollen Gross, M.D., assisted by Alexzandrew L. Perkins, P.A.-C.   ANESTHESIA:  General, postop Marcaine pain pump.   TOURNIQUET TIME:  73 minutes 300 mmHg.   CONSULTATIONS:  None.   BRIEF HISTORY:  [name] is a 55 year old female with end-stage arthritis of  the right knee with intractable pain who underwent a previous left total  knee arthroplasty and now presents for a right total knee.   LABORATORY DATA:  Pre-op CBC shows a hemoglobin of 14.4, hematocrit 41.7.  Differential normal.  Postop hemoglobin 11.9, last H&H 11 and 31.8.  PT/PTT  elevated preop 16.7 and 45.  INR 1.6.  Checked again, 11.9 and 0.9.  Serial  protimes were followed.  Last PT/INR 16.9 and 1.6.  Chem  panel on admission  had elevated glucose of 225, albumin low at 3.3, sodium low at 134.  Serial  BMETs are followed. Glucose came down to 153, sodium back up to 139.  The  remaining electrolytes remained within normal limits.  Pre-op UA had  positive glucose, ketones and proteins, few epithelial cells.  There were  only 0-2 white cells, few bacteria.  Blood group type B positive.   HOSPITAL COURSE:  Admitted to [facility] and underwent the above  procedure, which was tolerated well.  Later sent to the recovery room and  the orthopedic floor and continued postop care.  The patient was placed on  p.o. and IV analgesics for pain control following surgery.  Placed on  Coumadin for DVT prophylaxis.  Hemovac drain pulled on day one.  By day two,  the patient was doing a little bit better, more comfortable with better pain  control.  Was up ambulating approximately 150 feet.  PC and IV were  discontinued.  Progressed well through the weekend, and by day three she was  progressing well, positive flatus but  no bowel movement.  The dressing was  changed on day two, and the incision was healing well.  By day three, all of  the lines were discontinued.  We felt that she would be ready to go home in  the next day or so.  Discharge planning made arrangements, and by September 06, 2004, she was ambulating 160 feet, tolerating medications well and was ready  to go home.   DISCHARGE MEDICATIONS/PLAN:  1. September 06, 2004.  2. Discharge diagnoses same as above.  3. Discharge medications are Coumadin, Percocet, Robaxin.  4. Diet:  Resume previous home diet, regular diet.  5. Activity:  Total knee protocol.  Weightbearing as tolerated.  Home      health PT, home health nursing.  6. Follow up in 2 weeks from surgery.     DISPOSITION:  Home.   CONDITION ON DISCHARGE:  Improved.      ALP/MEDQ  D:  10/18/2004  T:  10/18/2004  Job:  [id number]   cc:   Ollen Gross, M.D.  [facility]   [address]  [address]  [place]  [place] [address]  Fax: [phone]   Francoise Schaumann. Halford Chessman  Fax: [phone]

## 2010-10-14 NOTE — Op Note (Signed)
[name], [name]            ACCOUNT NO.:  [id number]   MEDICAL RECORD NO.:  [id number]          PATIENT TYPE:  INP   LOCATION:  X002                         FACILITY:  [facility]   PHYSICIAN:  Ollen Gross, M.D.    DATE OF BIRTH:  [birth year]   DATE OF PROCEDURE:  07/11/2004  DATE OF DISCHARGE:                                 OPERATIVE REPORT   PREOPERATIVE DIAGNOSIS:  Osteoarthritis, left knee.   POSTOPERATIVE DIAGNOSIS:  Osteoarthritis, left knee.   PROCEDURE:  Left total knee arthroplasty, computer navigated.   SURGEON:  Ollen Gross, M.D.   ASSISTANT:  Alexzandrew L. Julien Girt, P.A.   ANESTHESIA:  General with postop Marcaine pain pump.   ESTIMATED BLOOD LOSS:  Minimal.   DRAINS:  Hemovac x1.   TOURNIQUET TIME:  66 minutes at 300 mmHg.   COMPLICATIONS:  None.   CONDITION:  Stable to recovery.   CLINICAL NOTE:  [name] is a 55 year old female with severe end-stage  valgus osteoarthritis of the left knee with intractable pain.  She has  failed nonoperative management including injection and presents now for  total knee arthroplasty.   PROCEDURE IN DETAIL:  After successful administration of general anesthetic,  a tourniquet was placed high on the left thigh and the left lower extremity  prepped and draped in the usual sterile fashion.  The extremity was wrapped  with an Esmarch, knee flexed, and the tourniquet inflated to 300 mmHg.  A  standard midline incision was made with a 10 blade through subcutaneous  tissue to the level of the extensor mechanism.  Given that she had  significant valgus deformity, we did a lateral parapatellar approach and  then the soft tissue over the proximal lateral tibia was subperiosteally  elevated out past the joint line with the knife.  The patella was then  retracted medially.  The ACL and PCL were then removed and the anterior  portion of the menisci removed.  The patella was then everted and the 4-mm  Schanz pins were then  placed, two in the tibia and two in the femur, for the  computerized arrays.  The anatomic data is then input into the computer and  the femoral and tibial models were generated.  Her preoperative alignment is  12 degrees of valgus with 3 degrees of hyper-extension.   We then began doing the bony cuts.  The distal femoral cut is first made  removing approximately 9 mm off the distal femur.  The resection is made in  neutral varus-valgus.  The cut is made and then the computer confirms that  it was made as planned.  Size 4 is the most appropriate femoral component  and we used the epicondylar axis for our rotation.  Rotation is marked and  then the AP cutting block is placed.  The anterior, posterior, and chamfer  cuts are made.  We then confirmed the cuts to be in good rotation and  alignment with the computer.   The tibia was then subluxed forward and the remainder of the menisci  removed.  The tibial cutting block is placed and we went to  resect 4 mm off  the deficient lateral side, first filing to about 8 mm off the lateral  deficient medial side.  The cut is made in neutral varus-valgus and about 2  degrees of posterior slope.  Resection is made with an oscillating saw and  is confirmed to be as planned by the computer.  We then prepared the  proximal tibia with the modular drill and a keel punch for a size 3.  Femoral preparation is completed with the intercondylar cut to a size 4.  Size 3 mobile bearing tibial trial with a size 4 posterior stabilized  femoral trial and a 10-mm posterior stabilized rotating platform insert  trial were placed.  With the 10, we achieved full extension and did not  hyper-extend any more.  The alignment was 1-2 degrees of valgus plus we  corrected over 10 degrees of valgus.  The knee has excellent balance in full  extension all the way down through full flexion.  We were able to achieve  130 degrees of flexion as measured by the computer.  The trials  were then  removed and the osteophytes were removed with the posterior femoral trial in  place.  Prior to this, we had inverted the patella again and measured the  thickness to be 22 mm.  A free hand resection was taken to 13 mm, the 38  template was placed, lug holes were drilled, trial patella was placed and it  tracks normally.  All trials were then removed and the cut bone surfaces  prepared with pulsatile lavage.  Cement is mixed and once ready for  implantation, the size 3 mobile bearing tibial tray, size 4 posterior  stabilized femur, and 38 patella are cemented into place and the patella was  held with a clamp.  A 10-mm trial insert was placed and the knee held in  full extension and all extruded cement removed.  The Schanz screws were then  taken out of the tibia and femur.  Once the cement was fully hardened, a  permanent 10-mm posterior stabilizer rotating platform inserted in its place  in the tibial tray.  The wound was copiously irrigated with saline solution.  A Hemovac drain is placed.  Tourniquet was released with a total time of 66  minutes.  Minor bleeding stopped with cautery.  The extensor mechanism is  then closed with interrupted #1 PDS.  We left open from the superior to the  inferior pole of the patella laterally to serve as a lateral release.  The  patella then tracks normally in flexion against gravity to 130 degrees.  Subcu was closed with interrupted 2-0 Vicryl and subcuticular with running 4-  0 Monocryl.  The catheter with Marcaine pain pump is placed and the pump  initiated.  Steri-Strips and a bulky sterile dressing were applied.  The  drain was hooked to suction.  She was placed into a knee immobilizer.  We  then sterilely prepped the right knee for a cortisone injection.  We  injected 9 cc of lidocaine with 1 cc of Depo-Medrol.  She tolerated the  procedures well without complications.  She was then awakened and transported to recovery in stable  condition.      FA/MEDQ  D:  07/11/2004  T:  07/11/2004  Job:  [id number]

## 2010-10-14 NOTE — Op Note (Signed)
[facility]  Patient:    [name], [name]                 MRN: [id number] Proc. Date: 04/17/00 Adm. Date:  [id number] Attending:  Jeannette Corpus CC:         Roseanna Rainbow, M.D.  Leatha Gilding. Mezer, M.D.  Telford Nab, R.N.   Operative Report  PREOPERATIVE DIAGNOSIS:  Grade 1 endometrial carcinoma.  POSTOPERATIVE DIAGNOSIS:  Grade 1 endometrial carcinoma.  PROCEDURE:  Exploratory laparotomy, total abdominal hysterectomy, and bilateral salpingo-oophorectomy.  SURGEON:  Daniel L. Clarke-Pearson, M.D.  ASSISTANT:  Roseanna Rainbow, M.D. and Telford Nab, R.N.  ANESTHESIA:  General with orotracheal tube.  ESTIMATED BLOOD LOSS:  100 cc.  SURGICAL FINDINGS:  At exploratory laparotomy, the uterus was slightly enlarged.  Tubes and ovaries had multiple paratubal cysts which were clear and simple.  There is no evidence of extra uterine metastases.  The pelvic and periaortic lymph nodes are normal.  Exploration of the upper abdomen was normal.  DESCRIPTION OF PROCEDURE:  The patient was brought to the operating room and after satisfactory obtainment of general anesthesia was placed in the modified lithotomy position in Guinda stirrups.  The anterior abdominal wall, perineum, and vulva were prepped with Betadine, a Foley catheter was placed, and the patient was draped.  The abdomen was entered through a midline incision. Perineal washings were obtained from the pelvis.  The pelvis and abdomen were explored with the above noted findings.  The Bookwalter retractor was positioned and the bowel was packed out of the pelvis.  The uterus was grasped with Kelly clamps and the round ligaments were divided.  The retroperitoneal spaces were opened, identifying the retroperitoneal structures.  The ovarian vessels were skeletonized, clamped, cut, and doubly free tied with 2-0 Vicryl. The bladder flap was advanced with sharp and blunt  dissection.  The uterine arteries and vessels were skeletonized, clamped, cut, and suture ligated.  In a step wise fashion, the paracervical and cardinal ligaments were clamped, cut, and suture ligated.  The vaginal angle was cross-clamped and the vagina transected at its connection with the cervix.  The vaginal angles were closed with interrupted transfixion sutures.  The central portion of the vagina closed with interrupted figure-of-eight sutures of 0 Vicryl.  The pelvis was inspected and hemostasis was appropriate.  Frozen section returned showing minimal superficial invasion, and therefore, lymph node dissection was not performed.  The retractors and packs were removed.  The anterior abdominal wall was closed in layers with the first being a running Smead-Jones closure with #1 PDS.  The subcutaneous tissue was irrigated and hemostasis achieved with cautery.  Two subcutaneous retention sutures of 0 nylon were placed.  The skin was closed with skin staples and the retention sutures tied over a plastic bolster.  A dressing was applied.  The patient was awakened from anesthesia and taken to the recovery room in satisfactory condition.  Sponge, needle, and instrument counts were correct x 2. DD:  04/17/00 TD:  04/18/00 Job: [id number]

## 2010-10-14 NOTE — Discharge Summary (Signed)
[name], [name]            ACCOUNT NO.:  [id number]   MEDICAL RECORD NO.:  [id number]          PATIENT TYPE:  OBV   LOCATION:  A202                          FACILITY:  [facility]   PHYSICIAN:  Mobolaji B. Bakare, M.D.DATE OF BIRTH:  [birth year]   DATE OF ADMISSION:  05/10/2006  DATE OF DISCHARGE:  12/14/2007LH                               DISCHARGE SUMMARY   PRIMARY CARE PHYSICIAN:  Dr. Milinda Cave.   FINAL DIAGNOSES:  1. Atypical chest pain.  Cardiolite stress test negative.  2. Diabetes mellitus uncontrolled.  3. Hyperlipidemia.  4. Obesity.  5. History of paroxysm atrial fibrillation.  6. Transaminitis secondary to fatty liver.  7. Hypertension.  8. Depression.   PROCEDURES:  1. Cardiolite stress test done on May 11, 2006 was negative for      evidence of ischemia.  2. Chest x-ray shows cardiomegaly with pulmonary vascular congestion,      bronchitic changes.  3. Cardiology consult.   BRIEF HISTORY:  [name] is a 55 year old Caucasian female.  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  She  presented with left-sided chest pain after having helped her mom to move  furniture.  There are significant risk factors for coronary artery  disease.  Initial clinical impression was musculoskeletal pain; however,  given her multiple risk factors, she was admitted to rule out myocardial  ischemia and subsequently she obtained a stress test.   HOSPITAL COURSE:  Problem 1:  ATYPICAL CHEST PAIN:  [name] did  rule out for myocardial infarction with three sets of cardiac enzymes.  She was seen by cardiology and it was felt that it would be prudent to  pursue stress test.  Hence, this was done and it turned out to be  negative.  The patient's chest pain actually resolved spontaneously and  also with one dose of sublingual nitroglycerin.  It was noted that her  diabetes was not well controlled.  Hemoglobin A1c was 11.4.  Hence, a  risk factor  modification was discussed with the patient and she agreed  to pursue adding Lantus to her treatment.  The patient was prior to  hospitalization on metformin and glyburide.  She will need to have a  follow-up hemoglobin A1c in three to four months.   Problem 2:  DYSLIPIDEMIA:  Fasting lipid profile was checked.  Cholesterol 236, LDL 130 and HDL 35.  These results were not available  at the time of discharge.  The patient was on lovastatin at the time of  admission at the dose of 20 mg daily.  This will need to be adjusted or  a second medication added as per Dr. Samul Dada discussion.   Problem 3:  HYPERTENSION:  Blood pressure was controlled during the  course of hospitalization.   Problem 4:  TRANSAMINITIS:  The patient has mildly elevated liver  enzymes of AST 47 and ALT of 50.  This remained stable during the course  of hospitalization and I noted she had an ultrasound of the abdomen  fairly recently which showed fatty liver.  This transaminitis was felt  to be  related to fatty liver.   CONDITION ON DISCHARGE:  The patient was stable.   DISCHARGE VITALS:  Temperature 97.3, blood pressure 121/55, pulse of 62  and O2 saturation of 96%.   DISCHARGE MEDICATIONS:  1. Paxil 80 mg daily.  2. Glyburide 10 mg b.i.d.  3. Lovastatin 20 mg daily.  4. Cardizem 120 mg daily.  5. Metformin 1000 mg b.i.d.  6. Lantus insulin 10 units daily.  7. Baby aspirin daily.   RECOMMENDATIONS:  The patient to have adjustment made to statin for  optimal treatment.  Follow up with Dr. Milinda Cave in one week.      Mobolaji B. Corky Downs, M.D.  Electronically Signed     MBB/MEDQ  D:  05/12/2006  T:  05/12/2006  Job:  [id number]   cc:   Jeoffrey Massed, MD  Fax: [phone]

## 2010-10-14 NOTE — Discharge Summary (Signed)
[name], Michele            ACCOUNT NO.:  [id number]   MEDICAL RECORD NO.:  [id number]          PATIENT TYPE:  INP   LOCATION:  A213                          FACILITY:  [facility]   PHYSICIAN:  Scott A. Gerda Diss, MD    DATE OF BIRTH:  19-Jan-1956   DATE OF ADMISSION:  04/28/2005  DATE OF DISCHARGE:  12/02/2006LH                                 DISCHARGE SUMMARY   HOSPITAL COURSE:  The patient was admitted in observation earlier today with  atrial fibrillation with rapid ventricular response.  She has spontaneously  converted to normal sinus rhythm and her cardiac enzymes are negative this  morning.  We are geared toward rechecking a cardiac enzyme panel between 11  a.m. and noon.  If she is doing well with this, we will discharge her to  home with followup in several days.   DISCHARGE PHYSICAL EXAMINATION:  LUNGS:  Clear.  HEART:  Sounds good.  EXTREMITIES:  No edema.      Scott A. Gerda Diss, MD  Electronically Signed     SAL/MEDQ  D:  04/29/2005  T:  04/29/2005  Job:  [id number]

## 2010-10-14 NOTE — H&P (Signed)
[facility]  Patient:    [name], [name] Visit Number: [id number] MRN: [id number]          Service Type: DSU Location: DAY Attending Physician:  Windle Guard Dictated by:   Candace Cruise, P.A.-C. Admit Date:  09/05/2001 Discharge Date: 09/05/2001                           History and Physical  CHIEF COMPLAINT:  Carpal tunnel syndrome, right hand.  HISTORY OF PRESENT ILLNESS:  The patient is a 55 year old white female who is being admitted to day surgery on September 24, 2001, to undergo a carpal tunnel release of the right hand.  She was initially seen here in our office this past month complaining of signs and symptoms of carpal tunnel syndrome.  She underwent nerve conduction velocities which showed she had bilateral carpal tunnel syndrome.  She underwent release of the carpal tunnel in the left wrist approximately two weeks ago, and has done well since then.  The sutures were removed from the wound today.  She desire to go ahead with the release of the carpal tunnel on the right.  This is why she is out of work.  Please refer to the previous history and physical on the patient for further information.  PAST MEDICAL HISTORY 1. Positive for atrial fibrillation. 2. Non-insulin-dependent diabetes mellitus. 3. Status post uterine cancer and surgery. 4. Depression.  PAST SURGICAL HISTORY 1. Several left knee arthroscopies per Dr. Darreld Mclean. 2. Bilateral tubal ligation. 3. Excision of nodule from the thyroid, Dr. Franky Macho. 4. Abdominal hysterectomy and bilateral S&O. 5. Left carpal tunnel release.  CURRENT MEDICATIONS 1. Actos 30 mg one tab q.d. 2. Glyburide 10 mg t.i.d. 3. Motrin 800 mg b.i.d. 4. Paxil 80 mg q.d. 5. Cardizem 120 mg q.d.  ALLERGIES:  No known drug allergies.  LOCAL MEDICAL DOCTOR:  Vivia Ewing, D.O.  FAMILY HISTORY/SOCIAL HISTORY:  The patient is married.  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  She  smokes 1-1/2 packs of cigarettes per day.  She does not use alcoholic beverages.  Diabetes, osteoporosis, and depression run in her family.  REVIEW OF SYSTEMS:  Noncontributory except for the following:  The patient has been treated for depression since 1958.  She has had a nodule on her thyroid, to be excised.  She is not on any medications for thyroid dysfunction.  PHYSICAL EXAMINATION  GENERAL:  The patient weighs 350 pounds.  She is 5 feet 10 inches.  She is alert and oriented x 3.  The patient is a white obese female who is alert and oriented x 3.  VITAL SIGNS:  Pulse 80, respirations 16, blood pressure 130/88.  HEENT:  Within normal limits.  NECK:  Supple, with no thyromegaly or masses palpated.  LUNGS:  Clear to A&P.  HEART:  A regular rhythm.  No cardiomegaly noted.  ABDOMEN:  Protuberant, obese, soft, nontender.  No organomegaly or masses palpated.  Hypoactive bowel sounds auscultated.  EXTREMITIES:  Right hand:  Positive Tinels and Phalens sign.  She can make a fist and fully extend the fingers.  Range of motion is intact to the wrist and fingers with the right dominant hand.  Neurovascular is intact.  SKIN:  Intact.  CNS:  Intact.  IMPRESSION 1. Bilateral carpal tunnel syndrome, status post two weeks release of    carpal tunnel of left wrist. 2. History of atrial fibrillation. 3.  Non-insulin-dependent diabetes mellitus. 4. Hypertension. 5. Status post uterine cancer and surgery. 6. Depression.  PLAN:  The patient is to be admitted to day surgery and undergo a carpal tunnel release of the right wrist on September 24, 2001. Dictated by:   Candace Cruise, P.A.-C. Attending Physician:  Windle Guard DD:  09/20/01 TD:  09/21/01 Job: [id number]

## 2010-10-14 NOTE — Op Note (Signed)
[name], [name]            ACCOUNT NO.:  [id number]   MEDICAL RECORD NO.:  [id number]          PATIENT TYPE:  INP   LOCATION:  0002                         FACILITY:  [facility]   PHYSICIAN:  Ollen Gross, M.D.    DATE OF BIRTH:  05/09/1956   DATE OF PROCEDURE:  09/02/2004  DATE OF DISCHARGE:                                 OPERATIVE REPORT   PREOPERATIVE DIAGNOSIS:  Osteoarthritis, right knee.   POSTOPERATIVE DIAGNOSIS:  Osteoarthritis, right knee.   PROCEDURE:  Right total knee arthroplasty, computer navigated.   SURGEON:  Dr. Lequita Halt   ASSISTANT:  Avel Peace, PA-C   ANESTHESIA:  General with postop Marcaine pain pump.   ESTIMATED BLOOD LOSS:  Minimal.   DRAIN:  Hemovac x 1.   TOURNIQUET TIME:  73 minutes at 300 mmHg.   COMPLICATIONS:  None.   CONDITION:  Stable to recovery.   BRIEF CLINICAL NOTE:  [name] is a 55 year old female with end-stage  arthritis of the right knee with intractable pain.  She recently underwent a  successful left total knee arthroplasty and presents now for right total  knee arthroplasty.   PROCEDURE IN DETAIL:  After the successful administration of general  anesthetic, a tourniquet is placed high on the right thigh and right lower  extremity prepped and draped in the usual sterile fashion.  Extremity is  wrapped in Esmarch, knee flexed, and the tourniquet inflated to 300 mmHg.  A  standard midline incision is made with a 10 blade through the subcutaneous  tissue to the level of the extensor mechanism.  A fresh blade is used to  make a medial parapatellar arthrotomy.  Then the soft tissue over the  proximal and medial tibia is subperiosteally elevated through the joint line  with a knife and into the semimembranosus bursa with a Cobb elevator.  The  soft tissue over the proximal and lateral tibia is also elevated with  attention being paid to avoiding the patellar tendon on tibial tubercle.  Patella is everted, knee flexed 90 degrees,  and ACL and PCL are removed.  The 4 mm Schanz screws are then placed, two in the tibia and two in the  femur.  The computerized arrays are then attached to the screws.  The  anatomical data is then inputted into the computer to allow for generation  of the femoral and tibial models as well as the overall alignment of the  leg.  The leg was aligned in approximately 3 degrees of varus with about a 7-  degree flexion contracture.   At this point, the distal femoral cutting block is placed so as to remove 10  mm from the distal femur.  With the computer guidance, it was placed in 0  degrees of varus valgus and then neutral flexion and extension with regard  to the mechanical axis of the femur.  We pinned the block to remove 10 mm  off the distal femur, and distal femoral resection is made with an  oscillating saw.  Verification block is placed to confirm that the cut was  made as planned.  The rotation block is  placed, marking the rotation off the  epicondylar axis.  Size 4 is the most appropriate size, and the anterior,  posterior block for a size 4 is  placed, and the anterior, posterior, and  chamfer cuts are made.   Tibia is subluxed forward, and the menisci are removed.  The tibial cutting  block is placed under computerized guidance so as to remove 10 mm off the  nondeficient lateral side, making the cut neutral varus valgus and with 3  degrees of posterior slope.  The cut is made on the tibia, and it is made as  planned once verified with the verification block.  The size 3 is the most  appropriate for the tibial component, and the proximal tibia is prepared  with the modular drill and keel punch for a size 3.  Femoral preparation is  completed with the intercondylar cut for the size 4.  The size 3 mobile  bearing tibial trial is placed as is the size 4 posterior stabilized femur.  A 10 mm posterior stabilized rotating platform insert is placed with the  knee hyperextended and some  varus valgus play.  We went to 12.5 which  allowed for full extension with excellent varus and valgus balance  throughout full range of motion.  We checked with the computer, and overall  alignment is 0.5 degrees of varus with 0 degrees extension and excellent  balance of the medial and lateral extension gap.  The patella is then  everted and thickness measured to be 24 mm.  Free hand resection is taken to  14 mm, the 35 template is placed, lug holes are drilled, the trial patella  is placed and it tracks normally.  Osteophytes are then removed off the  posterior femur with the trial in place.  The Schanz screws are subsequently  removed and the computer portion of the case completed.  All the trials are  removed, and the cut bone surfaces are prepared with pulsatile lavage.  Cement is mixed and once ready for implantation, the size 3 mobile bearing  tibial tray, size 4 posterior stabilized femur, and the 35 patella are  cemented into place, and the patella is held with a clamp.  Trial 12.5 mm  insert is placed, knee held in full extension and all extruded cement  removed.  Once the cement is fully hardened, then the permanent 12.5 mm  posterior stabilized rotating platform insert is placed into the tibial  tray.  The wound is copiously irrigated with saline solution and the  extensor mechanism closed over a Hemovac drain with interrupted #1 PDS.  Flexion against gravity is 135 degrees.  Subcu is closed with interrupted 2-  0 Vicryl and tourniquet released for a total time of 73 minutes.  The  catheter for the Marcaine pain pump is placed, and the pump is initiated.  Subcuticular layer is closed with a running 4-0 Monocryl.  Then the incision  is cleaned and dried and Steri-Strips applied.  The Hemovac is hooked to  suction.  Bulky sterile dressing is applied.  She is placed into a knee  immobilizer, awakened, and transported to recovery in stable condition.      FA/MEDQ  D:  09/02/2004   T:  09/02/2004  Job:  [id number]

## 2010-10-14 NOTE — Procedures (Signed)
[name], [name]            ACCOUNT NO.:  [id number]   MEDICAL RECORD NO.:  [id number]          PATIENT TYPE:  OUT   LOCATION:  RAD                           FACILITY:  [facility]   PHYSICIAN:  [place] Bing, M.D. [facility] OF BIRTH:  Dec 09, 1955   DATE OF PROCEDURE:  05/04/2005  DATE OF DISCHARGE:                                  ECHOCARDIOGRAM   REFERRING PHYSICIAN:  Francoise Schaumann. Halm, DO, FAAP, FACOP   CLINICAL DATA:  A 55 year old woman with atrial fibrillation, hypertension  and diabetes.   M-MODE TRACINGS:  Aorta 2.5, left atrium 4.7, septum 1.5, posterior wall  1.5, LV diastole 4.7, LV systole 2.8.   FINDINGS:  1.  Technically suboptimal, but adequate echocardiographic study.  2.  Mild left atrial enlargement; normal right atrium and right ventricle.  3.  Normal trileaflet aortic valve.  4.  Normal mitral valve; mild annular calcification.  5.  Normal tricuspid valve; physiologic regurgitation.  6.  Normal pulmonic valve and proximal pulmonary artery.  7.  Normal left ventricular size; mild concentric hypertrophy; hyperdynamic      regional and global function.      [place] Bing, M.D. [facility]  Electronically Signed     RR/MEDQ  D:  05/05/2005  T:  05/05/2005  Job:  [id number]

## 2010-10-14 NOTE — Discharge Summary (Signed)
[facility]  Patient:    [name], [name]                 MRN: [id number] Adm. Date:  [id number] Disc. Date: [id number] Attending:  Jeannette Corpus CC:         GYN Oncology   Discharge Summary  CHIEF COMPLAINT:  Patient is a 55 year old who presents for management of a grade 1 endometrial carcinoma.  Please see the dictated history and physical as per Dr. De Blanch.  HOSPITAL COURSE:  The patient was admitted and underwent a TAH-BSO.  Please see the operative dictation for further details.  Her postoperative course was remarkable for incisional discomfort that required adding Vioxx to the low potency narcotics.  She was discharged to home on postoperative day #2, tolerating a regular diet and with adequate analgesia.  DISCHARGE DIAGNOSIS:  Well-differentiated endometrial adenocarcinoma.  PROCEDURES:  TAH-BSO.  CONDITION:  Good.  MEDICATIONS: 1. Percocet 1-2 tablets p.o. q.6 p.r.n. 2. Vioxx 50 mg p.o. q.d. p.r.n.  ACTIVITY:  No heavy lifting, no driving for two weeks.  DIET:  No restrictions.  DISPOSITION:  The patient was to follow up with Dr. De Blanch on November 27 at 12:15. DD:  04/26/00 TD:  04/26/00 Job: [id number]

## 2010-10-14 NOTE — Procedures (Signed)
[name], [name]            ACCOUNT NO.:  [id number]   MEDICAL RECORD NO.:  [id number]          PATIENT TYPE:  OBV   LOCATION:  A202                          FACILITY:  [facility]   PHYSICIAN:  Gerrit Friends. 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   M-mode aorta 2.8, left atrium 4.9, septum 1.6, posterior wall 1.3, LV  diastole 4.9, LV systole 3.2.  1. Technically adequate echocardiographic study.  2. Mild left atrial enlargement; normal right ventricular size.  3. Normal right ventricular size and function; no RVH.  4. Mild sclerosis of a trileaflet aortic valve; mild calcification of      the aortic root, which is normal in diameter.  5. Normal mitral valve.  6. Normal tricuspid valve, pulmonic valve and proximal pulmonary      artery.  7. Normal left ventricular size; mild hypertrophy with      disproportionate thickening of the upper septum.  Normal to      hyperdynamic regional and global LV systolic function.      Gerrit Friends. Dietrich Pates, MD, [facility]  Electronically Signed     RMR/MEDQ  D:  05/14/2006  T:  05/14/2006  Job:  [id number]

## 2010-10-14 NOTE — H&P (Signed)
[name], BROSKI            ACCOUNT NO.:  [id number]   MEDICAL RECORD NO.:  [id number]           PATIENT TYPE:  AMB   LOCATION:  DAY                           FACILITY:  [facility]   PHYSICIAN:  Dalia Heading, M.D.  DATE OF BIRTH:  1955/10/01   DATE OF ADMISSION:  DATE OF DISCHARGE:  LH                                HISTORY & PHYSICAL   CHIEF COMPLAINT:  Chronic cholecystitis, incisional hernia.   HISTORY OF PRESENT ILLNESS:  The patient is a 55 year old white female, who  was referred for evaluation and treatment of chronic cholecystitis as well  as well as an incisional hernia.  She has been having right upper quadrant  abdominal pain with radiation to the right flank, nausea, bloating for many  years.  She does have fatty food intolerance.  No fever, chills, or jaundice  have been noted.  She has had multiple abdominal surgeries in the past and  has had an enlarging hernia in the supraumbilical region.   PAST MEDICAL HISTORY:  1.  Morbid obesity.  2.  Hypertension.  3.  Non-insulin-dependent diabetes mellitus.   PAST SURGICAL HISTORY:  1.  Abdominal hysterectomy.  2.  Tubal ligation.  3.  Partial thyroidectomy.  4.  Carpal tunnel release.  5.  Bilateral knee replacements   CURRENT MEDICATIONS:  1.  Paxil 80 mg p.o. daily.  2.  Glyburide 10 mg p.o. daily.  3.  Cardizem 180 mg p.o. daily.  4.  Vasotec 5 mg p.o. daily.  5.  Provigil 100 mg p.o. daily.   ALLERGIES:  No known drug allergies.   REVIEW OF SYSTEMS:  The patient does smoke a pack and a half of cigarettes a  day.  She denies alcohol use.  She also states she suffers from fibromyalgia  and chronic depression.  She has a history of atrial fibrillation, but that  has been under control of late.   PHYSICAL EXAMINATION:  GENERAL:  The patient is a morbidly obese white  female in no acute distress.  LUNGS:  Clear to auscultation with mild expiratory wheezing noted.  Equal  breath sounds are heard  bilaterally.  HEART:  Regular rate and rhythm without S3, S4, murmurs.  ABDOMEN:  Soft and nondistended.  She is tender in the supraumbilical region  where her swelling is present.  It was reducible when lying down.  No  hepatosplenomegaly or masses are noted.  This is just superior to a midline  incision.  No hepatosplenomegaly.   Ultrasound of the gallbladder is negative.  HIDA scan reveals chronic  cholecystitis with borderline gallbladder ejection fraction and reproducible  symptoms with fatty meal.   IMPRESSION:  1.  Chronic cholecystitis.  2.  Incisional hernia.   PLAN:  The patient is scheduled for laparoscopic cholecystectomy and  incisional herniorrhaphy with possible mesh on October 30, 2005.  The risks and  benefits of both procedures including bleeding, infection, hepatobiliary  injury, cardiopulmonary difficulties, the possibility of the recurrence of  the hernia were fully explained to the patient, gave informed consent.      Dalia Heading, M.D.  Electronically Signed     MAJ/MEDQ  D:  10/19/2005  T:  10/19/2005  Job:  [id number]   cc:   Francoise Schaumann. Milford Cage DO, FAAP  Fax: [phone]

## 2010-10-14 NOTE — H&P (Signed)
NAMEARNOLD, [name]            ACCOUNT NO.:  [id number]   MEDICAL RECORD NO.:  [id number]          PATIENT TYPE:  INP   LOCATION:  NA                           FACILITY:  [facility]   PHYSICIAN:  Ollen Gross, M.D.    DATE OF BIRTH:  [birth year]   DATE OF ADMISSION:  07/11/2004  DATE OF DISCHARGE:                                HISTORY & PHYSICAL   CHIEF COMPLAINT:  Left knee pain.   HISTORY OF PRESENT ILLNESS:  The patient is a 55 year old female seen by Dr.  Ollen Gross for ongoing bilateral knee pain, left is more symptomatic than  the right. She has been treated conservatively in the past and has also  undergone arthroscopies by Dr. Teola Bradley in the past. She was seen by  Dr. Ollen Gross for a second opinion. She was found to have end-stage bone-  on-bone arthritis in the left knee with bone-on-bone in the lateral  compartment, about a 10 to 15 degree valgus deformity. The right knee is  also bone-on-bone with about a five degree varus deformity. She is found to  have severe end-stage arthritis and felt to be appropriate candidate for  surgery. Risks and benefits have been discussed. The patient is subsequently  admitted to the hospital.   ALLERGIES:  No known drug allergies.   CURRENT MEDICATIONS:  1.  Glyburide 10 mg b.i.d.  2.  Paxil 80 mg q.d.  3.  She stopped ibuprofen.   PAST MEDICAL HISTORY:  1.  Diabetes mellitus.  2.  Hypertension.  3.  Obesity.  4.  Depression.  5.  Uterine cancer.  6.  Bronchitis.   PAST SURGICAL HISTORY:  1.  Bilateral carpal tunnel.  2.  Hysterectomy.  3.  Tubal ligation.  4.  She has undergone several knee surgeries.  5.  Removal of a thyroid nodule.   SOCIAL HISTORY:  Married with three children. Smoker of about a pack per  day, wants to quit while she is in the hospital. No alcohol. She works as a  Engineer, civil (consulting) in Producer, television/film/video at [employer].   FAMILY HISTORY:  Mother living age 77 with a heart murmur, elevated  cholesterol,  blood pressure problems. Father living at age 44 with cardiac  history and diabetes. Family history is also significant for diabetes, heart  disease, hypertension, and breast cancer.   REVIEW OF SYMPTOMS:  GENERAL:  No fever, chills, or night sweats.  NEUROLOGICAL:  No seizure, syncope, or paralysis. RESPIRATORY:  No shortness  of breath, productive cough, or hemoptysis. CARDIOVASCULAR:  No chest pain,  angina, or orthopnea. GI:  No nausea, vomiting, diarrhea, or constipation.  GU:  No dysuria, hematuria, or discharge. MUSCULOSKELETAL:  Left knee as  found in the history of present illness.   PHYSICAL EXAMINATION:  VITAL SIGNS:  Pulse 68, respirations 16, blood  pressure 134/82.  GENERAL:  A 55 year old white female, well-developed, well-nourished and in  no acute distress. She is overweight and obese. Alert, oriented,  cooperative, and very pleasant. Appears to be an excellent historian.  HEENT:  Normocephalic and atraumatic. Pupils are equal, round, and reactive.  EOMs intact.  NECK:  Supple. No carotid bruits. Previous thyroid scar noted.  CHEST:  Clear anterior and posterior chest wall. No rhonchi, rales, or  wheezing.  HEART:  Regular rate and rhythm. No murmur. S1 and S2 noted.  ABDOMEN:  Soft, protuberant abdomen. Bowel sounds are present.  RECTAL:  Not done, not pertinent to present illness.  BREASTS:  Not done, not pertinent to present illness.  GENITALIA:  Not done, not pertinent to present illness.  EXTREMITIES:  Left knee shows a moderate valgus deformity with range of  motion of 5 to 100 degrees. Moderate crepitus is noted. She is more tender  lateral than medial. Right knee shows range of motion of 120 degrees. No  instability, slight varus deformity, tender medially.   IMPRESSION:  1.  Bilateral knees osteoarthritis, left more symptomatic than right.  2.  Diabetes.  3.  Hypertension.  4.  Obesity.  5.  Depression.  6.  Uterine cancer.  7.  History of  bronchitis.   PLAN:  The patient is admitted to [facility] to undergo a left  total knee arthroplasty and Cortisone injection into the right knee. Surgery  will be performed by Dr. Ollen Gross. She wishes to quit smoking while she  is in the hospital. We will use the nicotine patch and other adjunctive  therapies as needed.      ALP/MEDQ  D:  07/10/2004  T:  07/10/2004  Job:  [id number]   cc:   Ollen Gross, M.D.  [facility]  [address]  [address]  [place]  [place] [address]  Fax: [phone]   Rene Paci, M.D.  [facility]  [address] [place], [place] [address]

## 2010-10-14 NOTE — Discharge Summary (Signed)
[name], [name]            ACCOUNT NO.:  [id number]   MEDICAL RECORD NO.:  [id number]         PATIENT TYPE:  LINP   LOCATION:  0463                         FACILITY:  [facility]   PHYSICIAN:  Ollen Gross, M.D.    DATE OF BIRTH:  12-Feb-1956   DATE OF ADMISSION:  07/11/2004  DATE OF DISCHARGE:  07/14/2004                                 DISCHARGE SUMMARY   ADMISSION DIAGNOSES:  1.  Bilateral knee osteoarthritis, left more symptomatic than right.  2.  Diabetes.  3.  Hypertension.  4.  Obesity.  5.  Depression.  6.  Uterine cancer.  7.  History of bronchitis.   DISCHARGE DIAGNOSES:  1.  Osteoarthritis, left knee, status post left total knee arthroplasty,      computer-navigation assisted.  2.  Postoperative blood loss anemia.  Did not require transfusion.  3.  Mild hyponatremia, improved.  4.  Diabetes.  5.  Hypertension.  6.  Obesity.  7.  Depression.  8.  Uterine cancer.  9.  History of bronchitis.   PROCEDURE:  On July 11, 2004, left total knee arthroplasty, computer-  navigation assisted.   SURGEON:  Dr. Lequita Halt.   ASSISTANT:  Alexzandrew L. Perkins, P.A.C.   ANESTHESIA:  General, with post-op Marcaine pain pump.   ESTIMATED BLOOD LOSS:  Minimal blood loss.   TOURNIQUET TIME:  60 minutes at 300 mmHg.   CONSULTATIONS:  None.   BRIEF HISTORY AND PHYSICAL:  [name] is a 55 year old female with a  severe end-stage arthritis with valgus deformity, failed nonoperative  management including injection, and now presents for total knee.   LABORATORY DATA:  CBC preop:  Hemoglobin 15.4, hematocrit of 44.9, normal  white count.  Normal differential.  Hemoglobin postop 12.7.  Last noted H&H  at 11.4 and 33.0.  PT/PTT preop 12.3 and 31 respectively.  INR 0.9.  PT and  INR 17 and 1.6.  Chem panel on admission:  Slightly low sodium on admission  of 134, glucose elevated at 264, remaining chem panel within normal limits.  Serum BMETs were followed.  Sodium did come  up to 136 and back down to 135.  Glucose came down to 193. Urinalysis negative.  Blood group type B positive.  EKG dated July 07, 2004:  Normal sinus rhythm.  Normal EKG.  No  significant change since last tracing of February 23, 2000 confirmed by Dr.  Susa Griffins.  Two view knee on July 11, 2004:  Good position and  alignment following left total knee.   HOSPITAL COURSE:  The patient was [facility] to [facility], and  underwent procedure.  Tolerated it well.  Later returned to the recovery  room on the orthopedic floor.  Started on PCA pain control.  On day 1,  Hemovac drain was noticed that it came out through the night.  Did have some  moderate drainage on her dressing.  The dressing was changed, and incision  was healing well.  She had been seen in rounds by Dr. Lequita Halt.  Therapy was  started.  She did get up out of bed.  By day 2, she  was already sitting up  in the chair on morning rounds.  She did have some pain, and she was noted  to have a sore on the inside of her lip which was felt to be due to the  tube.  She was given some Orabase gel b.i.d. for pain control for this.  Her  CBGs which were followed continued to improve.  She was started back on her  diabetic medications.  CBGs overall were improving.  They got as high at  284, but that backed down to 185 on day 2.  Hemoglobin was stable at 11.8.  Incision looked good.  No active drainage that morning.  Continued to  progress with physical therapy, and was up and ambulating approximately 40  feet that morning and 100 feet that afternoon.  By day 3, continued  ambulating about 100 feet.  Incision was healing well, progressing well with  physical therapy, and had been seen in rounds by Dr. Lequita Halt, and decided to  be discharged home at that time.   DISCHARGE PLAN:  The patient was discharged home on July 14, 2004.   DISCHARGE DIAGNOSIS:  Please see above.   DISCHARGE MEDICATIONS:  1.  Percocet.  2.   Robaxin.  3.  Coumadin.   DIET:  Resume previous diabetic diet.   FOLLOW UP:  Follow up 2 weeks from surgery.  Call the office for an  appointment.   ACTIVITY:  Weightbearing as tolerated, home PT, home nursing, total knee  protocol.   CONDITION ON DISCHARGE:  Improving.      ALP/MEDQ  D:  08/03/2004  T:  08/03/2004  Job:  [id number]   cc:   Francoise Schaumann. Halford Chessman  Fax: [phone]

## 2010-10-14 NOTE — Op Note (Signed)
[facility]  Patient:    [name], [name] Visit Number: [id number] MRN: [id number]          Service Type: DSU Location: DAY Attending Physician:  Windle Guard Dictated by:   Darreld Mclean, M.D. Proc. Date: 09/24/01 Admit Date:  09/24/2001                             Operative Report  PREOPERATIVE DIAGNOSES:  Carpal tunnel syndrome, right.  POSTOPERATIVE DIAGNOSES:  Carpal tunnel syndrome, right.  PROCEDURE:  Release right volar carpal ligament, saline neurolysis and aponeurotomy right median nerve.  ANESTHESIA:  Bier block. Please refer to anesthesia record for tourniquet time.  SURGEON:  Darreld Mclean, M.D.  ASSISTANT:  Candace Cruise, P.A.-C.  A volar plaster splint applied. No drains.  INDICATIONS FOR PROCEDURE:  The patient is a 55 year old female with pain and tenderness in both wrists who recently had a left carpal tunnel release, now for right. She has had positive nerve conduction velocities and EMGs showing carpal tunnel syndrome bilaterally. The left was greater than right. She has done well on the left side and now wants to proceed on the right side. She understands the risks and imponderables having undergone the procedure recently.  DESCRIPTION OF PROCEDURE:  The patient was placed supine on the operating room table with the end table attached. Bier block anesthesia was given for anesthesia. The patient was prepped and draped in the usual manner. A ______ incision was made with careful dissection, the median nerve was identified proximally and a Vessiloop placed around the nerve. A groove director placed in the carpal tunnel space. The volar carpal ligament was then incised. Specimen sent to the laboratory. The nerve was significantly and severely compressed. Saline neurolysis and aponeurotomy carried out. Retinaculum cut proximally. The nerve was inspected with no apparent injury. The wound reapproximated using 3-0 nylon in  interrupted vertical mattress manner. A sterile dressing applied, a bulky dressing applied, sheet cut and applied, sheet cut dorsally. Volar plaster splint applied and Ace bandage applied loosely. The patient tolerated the procedure well, sponge and needle count correct, went to recovery in good condition. Appropriate analgesia given for pain. She is to contact me through the office or hospital beeper system if any problems. Numbers have been provided. Dictated by:   Darreld Mclean, M.D. Attending Physician:  Windle Guard DD:  09/24/01 TD:  09/24/01 Job: [id number]

## 2010-10-14 NOTE — H&P (Signed)
NAMEEDMONIA, GONSER            ACCOUNT NO.:  [id number]   MEDICAL RECORD NO.:  [id number]          PATIENT TYPE:  INP   LOCATION:  0469                         FACILITY:  [facility]   PHYSICIAN:  Ollen Gross, M.D.    DATE OF BIRTH:  1955-12-25   DATE OF ADMISSION:  09/02/2004  DATE OF DISCHARGE:                                HISTORY & PHYSICAL   CHIEF COMPLAINT:  Right knee pain.   HISTORY OF PRESENT ILLNESS:  The patient is a 55 year old female well known  to [employer].  She had previously undergone a left total knee  replacement and arthroplasty earlier this year.  She has done extremely well  with it.  She was known to have bilateral knee arthritis.  Her left has been  replaced and now she comes back in for surgical intervention of the opposite  leg, on the right.  Risks and benefits have been discussed with the patient,  felt to be an excellent candidate, subsequently admitted to the hospital.   PAST MEDICAL HISTORY:  Diabetes mellitus, hypertension, obesity, depression,  uterine cancer, bronchitis, history of paroxysmal atrial fibrillation in  2000, otitis media recently, current on antibiotics.   PAST SURGICAL HISTORY:  Bilateral carpal tunnel, left total knee replacement  arthroplasty, hysterectomy, tubal ligation.  Undergone several knee  surgeries, removal of left thyroid nodule.   ALLERGIES:  No known drug allergies.   CURRENT MEDICATIONS:  1.  Glyburide 10 mg b.i.d.  2.  Paxil 80 mg daily.  3.  Altace 10 mg daily.  4.  Depakote 500 mg b.i.d.  5.  Tylenol 2 tablets p.o. p.r.n.  6.  Omnicef 300 mg daily for 5 days prior to surgery.  She will have to stop      this prior to admission.   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   FAMILY HISTORY:  Mother living and she is described with heart murmur,  elevated cholesterol, blood pressure.  Father living at 20 with  cardiac  history and diabetes.  Family history is also significant for diabetes,  heart disease, hypertension and breast cancer.   REVIEW OF SYSTEMS:  GENERAL:  No fever, chills or nightsweats.  NEURO:  No  seizures, syncope, paralysis.  She is being treated for otitis media with  Omnicef.  RESPIRATORY:  No shortness of breath, productive cough or  hemoptysis. CARDIOVASCULAR:  No chest pain, orthopnea.  GI:  No nausea and  vomiting or diarrhea or constipation.  GU:  No dysuria, hematuria or  discharge.  MUSCULOSKELETAL:  Right knee per history of present illness.   PHYSICAL EXAMINATION:  Vital signs:  Pulse 76, respirations 20, blood  pressure 112/68.  GENERAL:  A 55 year old white female, well-nourished, well-developed, no  acute distress.  She is overweight and obese, alert and oriented and  cooperative.  Very pleasant.  Appears to be an excellent historian.  HEENT:  Normocephalic and atraumatic.  Pupils round and reactive.  Oropharynx is clear.  Extraocular movements are intact.  She is known to  wear glasses.  NECK:  Supple, no carotid bruits are appreciated.  CHEST:  Clear, anterior and posterior, no rhonchi, rales or wheezing.  HEART:  Regular rate and rhythm, no murmurs, S1, S2 noted.  ABDOMEN:  Soft, nontender.  Bowel sounds are present, protuberant abdomen.  BREASTS:  Not done, not pertinent.  EXTREMITIES:  Right knee.  Motor function is intact throughout the right  lower extremity.  Sensation is intact.  She does have moderate crepitus  noted on passive range of motion.   IMPRESSION:  1.  Osteoarthritis right knee.  2.  Diabetes mellitus.  3.  Hypertension.  4.  Obesity.  5.  Depression.  6.  Uterine cancer.  7.  Bronchitis.  8.  History of paroxysmal atrial fibrillation in 2000.  9.  Recent otitis media treated with antibiotics perioperatively.   PLAN:  The patient admitted to [facility] to undergo a right total  knee replacement arthroplasty.  Surgery  will be performed by Dr. Ollen Gross.      ALP/MEDQ  D:  09/04/2004  T:  09/04/2004  Job:  [id number]   cc:   Ollen Gross, M.D.  [facility]  [address]  [address]  [place]  [place] [address]  Fax: [phone]   Francoise Schaumann. Halford Chessman  Fax: [phone]

## 2010-10-14 NOTE — Op Note (Signed)
[facility]  Patient:    [name], [name]               MRN: [id number] Proc. Date: 03/01/00 Adm. Date:  [id number] Attending:  Rolinda Roan CC:         Langley Gauss, M.D., [place], [place]   Operative Report  PREOPERATIVE DIAGNOSIS:  Endometrial cancer.  POSTOPERATIVE DIAGNOSIS:  Endometrial cancer, pending pathology.  OPERATION PERFORMED:  Hysteroscopy and fractional D&C.  ANESTHESIA:  General.  SURGEON:  Howard C. Mezer, M.D.  PREPARATION:  Betadine.  DESCRIPTION OF PROCEDURE:  With the patient in lithotomy position, she was prepped and draped in the routine fashion.  Multiple speculums were employed to find one suitable to provide adequate visualization of the cervix for hysteroscopy.  Endocervical curettings were obtained, and the uterus was sounded to 11 cm.  The cervix was then very easily dilated, hysteroscope introduced, and multiple polypoid structures were noted in the endometrium along with a large amount of blood and endometrial tissue.  The hysteroscope was removed, and the cavity was sharply curetted and productive of a large amount of tissue.  Care was taken to obtain representative sampling from all quadrants of the uterus.  There was some minimal bleeding at the end of the procedure.  Sorbitol was used as ______  medium with minimal absorption. The estimated blood loss was less than 50 cc.  The sponge, needle and instrument counts were correct.  The patient tolerated the procedure well and was taken to the recovery room in satisfactory condition. DD:  03/01/00 TD:  03/01/00 Job: [id number]

## 2010-10-14 NOTE — H&P (Signed)
NAMETAKENYA, [name]            ACCOUNT NO.:  [id number]   MEDICAL RECORD NO.:  [id number]          PATIENT TYPE:  OBV   LOCATION:  A202                          FACILITY:  [facility]   PHYSICIAN:  Osvaldo Shipper, MD     DATE OF BIRTH:  03/07/1956   DATE OF ADMISSION:  05/10/2006  DATE OF DISCHARGE:  LH                              HISTORY & PHYSICAL   PRIMARY CARE PHYSICIAN:  Jeoffrey Massed, M.D.   She has been seen by [facility] in the past for paroxysmal  atrial fibrillation.   ADMITTING DIAGNOSES:  1. Chest pain, likely musculoskeletal.  2. Rule out acute coronary syndrome.  3. Type 2 diabetes.  4. Obesity.  5. Dyslipidemia.  6. Depression.  7. Hypertension.  8. Paroxysmal atrial fibrillation.  9. Osteoarthritis.   CHIEF COMPLAINT:  Left-sided chest pain since last night.   HISTORY OF PRESENT ILLNESS:  The patient is a 55 year old Caucasian  female who has medical problems as stated above who was helping her  mother move by lifting boxes and furniture over the past few days.  The  patient started having pain which initially started in the back below  her left scapula, and then seemed to radiate to the front side, and now  predominantly present over the left side of the chest, under her left  breast, and somewhat going to the left upper arm.  The pain was 10/10 in  intensity yesterday, and is currently about 7/10 in intensity.  The pain  is described as a pressure-like sensation.  She has never experienced  this pain in the past.  She also reports some shortness of breath with  ambulation; none otherwise.  No history of palpitations, diaphoresis,  nausea, vomiting, dizziness or syncopal episodes.  She reports a history  of a stress test back in either 2000 or 2001, which was reported as  unremarkable to her.  She has never had a cardiac catheterization done.  Her last encounter with the cardiologist was about 2 years ago.  He does  not give a history of GERD or  any peptic ulcer disease.   MEDICATIONS AT HOME:  1. Paxil 80 mg once a day.  2. Glyburide 10 mg once a day.  3. Cardizem CD 180 mg once a day.  4. Motrin 800 mg as stated for osteoarthritis.  5. Lovastatin 20 mg once a day.  6. Metformin 1000 mg b.i.d.  (She also informed me that she is being considered to be initiated on  long-acting insulin for her diabetes by her primary medical doctor.  She  is not on Coumadin.)   ALLERGIES:  No known drug allergies.   PAST MEDICAL HISTORY:  1. Significant for paroxysmal atrial fibrillation diagnosed in 2000 or      2001, and then had another recurrence about 2 years ago, but she      was not put on anticoagulation for this by her cardiologist.  She      is not taking any aspirin, as well.  2. Type 2 diabetes, on oral hypoglycemic agents and being considered  by insulin at this time.  According to the patient, her last A1C      was more than 10.  3. History of osteoarthritis.  4. History of depression.  5. History of hypertension.  6. History of right temporal headaches for the past 2 years.   SURGICAL HISTORY:  1. Cholecystectomy.  2. Thyroid colon nodule removal.  3. Hernia repair to her abdomen.  4. She is status post bilateral total knee replacements.  5. She has also had carpal tunnel surgery in the past.  6. She has had arthroscopic procedures to her knee in the past.   SURGEON:  Dr. Lovell Sheehan.   SOCIAL HISTORY:  Lives in [place] with her family.  Five children.  Independent with her daily activities.  Smokes a pack to a pack and a  half of cigarettes per day.  Has approximately a 40 pack year history of  smoking.  No alcohol use.  No illicit drug use.  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   FAMILY HISTORY:  Father had coronary artery disease apparently in his  30s.  Brother also had an MI earlier this year; his age is 10.  There is  also some history of coronary artery disease in some of her  maternal  aunts and cousins.  All of them are premature.   REVIEW OF SYSTEMS:  NEUROLOGIC:  Positive for drooping of the left eye,  especially at the end of the day, which she been noticing for the past 1  year.  She also reports weakness after repeated motions also in her  extremities for the past 1 year.  GENERAL:  Otherwise, just positive for  mild weakness.  CARDIOVASCULAR:  See HPI.  RESPIRATORY:  Unremarkable.  No cough, fever or chills.  ENDOCRINE:  Unremarkable, except for thyroid  problems as mentioned in the past medical history.  GI:  Hernia.  GU:  Unremarkable.  MUSCULOSKELETAL:  Arthritis pain.  ID unremarkable.  Other review of systems unremarkable.   CLINICAL EXAMINATION:  VITAL SIGNS:  Temperature 98.3, heart rate in the  50s to 60s, blood pressure 174/62, respiratory rate 14, saturation of  66% on room air.  GENERAL:  An obese white female in no discomfort or distress.  HEENT:  There is no pallor, no icterus.  Oral mucous membranes are  moist.  No oral lesions are noted at this time.  NECK:  Soft and supple.  A scar over the anterior part of the neck is  noted.  LUNGS:  Clear to auscultation bilaterally.  CARDIOVASCULAR:  S1 and S2 are normal.  Regular.  No murmurs  appreciated.  No carotid bruits heart.  No S3, S4.  No rubs or murmurs  heard.  ABDOMEN:  There is some mild tenderness in the epigastric area.  No  rebound or guarding.  No mass or organomegaly is appreciated.  Scars  from previous surgeries noted.  EXTREMITIES:  Without edema.  Pulses are palpable.  NEUROLOGIC:  The patient is alert and oriented x3.  No focal  neurological deficits are present.   LABORATORY DATA:  CBC shows a hemoglobin of 16.  Other parameters  normal.  BMET shows a glucose of 267.  Other parameters normal.  B.  natruretic peptide is 105.  The first cardiac markers showed a troponin of 0.05.  The second ones showed 0.07.  CK and CK-MB are normal.   CHEST X-RAY:  The chest x-ray  showed cardiomegaly, prominent  vascular  markings.  The radiologist interpreted it as mild pulmonary edema,  though clinically she does not look like that.   ELECTROCARDIOGRAM:  EKG shows a sinus rhythm with a normal axis.  I do  not see any Q waves on his EKG.  Really, a completely unremarkable EKG.  No ST changes are noted.  No T wave changes are noted.  Intervals all  appears to be within the normal range.   ASSESSMENT AND PLAN:  This is a 55 year old Caucasian female with  medical problems as stated earlier who presents with chest pain starting  in the back and then radiating to the front.  This started after she had  been helping her mother move, and as a result she has been lifting heavy  boxes and moving furniture.  The most likely etiology for this pain is  musculoskeletal.  I forgot to mention in my physical exam that the pain  was reproducible by palpation.  Other differential includes acid reflux  disease.  Definitely, she has risk factors for coronary artery disease  in the form of obesity, cigarette smoking, diabetes and hypertension.  She reports a stress test more than 5 years ago, which she thinks was  unremarkable, though we do not know for sure.  She does have one  abnormal troponin level.   PLAN:  1. Chest pain.  Considering the above, we will observe the patient in      the hospital and rule her out for acute coronary syndrome.  Repeat      EKG, and see what happens to her pain by giving her nitroglycerin.      We will put her on some analgesic agents to help with the      musculoskeletal pain, as well.  We will obtain a 2-D echocardiogram      because of her cardiomegaly.  Because of her risk factors, I think      she would benefit from a stress test, the timing of which will need      to be decided, based on what happens to the patient in the      hospital.  If she rules out, and if she is pain free, this possibly      may be done as an outpatient.  I will have  [facility] see      her, and will defer to them regarding this aspect.  Will start her      on aspirin.  Hold off on anticoagulation and beta blockers for now.      If the troponins become abnormal, then we will initiate the above.      Will given her proton pump inhibitor, as well.  2. Diabetes.  Continue her glyburide and metformin.  We will put her      on sliding scale, check an A1C, initiate Lantus as needed.  3. Hypertension, stable.  Continue her meds.  4. Paroxysmal atrial fibrillation.  Currently, it is sinus.  She is      not on anticoagulation at this time.  Will defer to the      cardiologist at this time.  5. Check a fasting lipid profile.  Continue lovastatin, DVT and GI      prophylaxis.  6. The patient gives history of drooping of her eyelids, especially on      the left side. when feeling tired.  Primary medical doctor may want     to consider evaluating for myasthenia gravis.  Osvaldo Shipper, MD  Electronically Signed     GK/MEDQ  D:  05/10/2006  T:  05/10/2006  Job:  [id number]   cc:   Pricilla Riffle, MD, [facility]  [address]  [address]  [place]  [place] [address]   Jeoffrey Massed, MD  Fax: [phone]

## 2010-12-22 ENCOUNTER — Ambulatory Visit (HOSPITAL_COMMUNITY)
Admission: RE | Admit: 2010-12-22 | Discharge: 2010-12-22 | Disposition: A | Discharge status: Home / Self Care | Payer: Commercial Managed Care - PPO | Source: Ambulatory Visit | Attending: Pediatrics | Admitting: Pediatrics

## 2010-12-22 ENCOUNTER — Other Ambulatory Visit (HOSPITAL_COMMUNITY): Payer: Self-pay | Admitting: Pediatrics

## 2010-12-22 DIAGNOSIS — M549 Dorsalgia, unspecified: Secondary | ICD-10-CM

## 2010-12-22 DIAGNOSIS — IMO0002 Reserved for concepts with insufficient information to code with codable children: Secondary | ICD-10-CM | POA: Insufficient documentation

## 2010-12-22 DIAGNOSIS — M545 Low back pain, unspecified: Secondary | ICD-10-CM | POA: Insufficient documentation

## 2010-12-22 DIAGNOSIS — M51379 Other intervertebral disc degeneration, lumbosacral region without mention of lumbar back pain or lower extremity pain: Secondary | ICD-10-CM | POA: Insufficient documentation

## 2010-12-22 DIAGNOSIS — M5137 Other intervertebral disc degeneration, lumbosacral region: Secondary | ICD-10-CM | POA: Insufficient documentation

## 2011-03-23 ENCOUNTER — Other Ambulatory Visit (HOSPITAL_COMMUNITY): Payer: Self-pay | Admitting: Podiatry

## 2011-03-23 DIAGNOSIS — M869 Osteomyelitis, unspecified: Secondary | ICD-10-CM

## 2011-03-24 ENCOUNTER — Other Ambulatory Visit (HOSPITAL_COMMUNITY): Payer: Self-pay | Admitting: Diagnostic Neuroimaging

## 2011-03-24 DIAGNOSIS — G3184 Mild cognitive impairment, so stated: Secondary | ICD-10-CM

## 2011-03-24 DIAGNOSIS — G459 Transient cerebral ischemic attack, unspecified: Secondary | ICD-10-CM

## 2011-03-28 ENCOUNTER — Encounter (HOSPITAL_COMMUNITY)
Admission: RE | Admit: 2011-03-28 | Discharge: 2011-03-28 | Disposition: A | Discharge status: Home / Self Care | Payer: 59 | Source: Ambulatory Visit | Attending: Podiatry | Admitting: Podiatry

## 2011-03-28 ENCOUNTER — Other Ambulatory Visit (HOSPITAL_COMMUNITY): Payer: 59

## 2011-03-28 ENCOUNTER — Encounter (HOSPITAL_COMMUNITY): Payer: Self-pay

## 2011-03-28 DIAGNOSIS — M869 Osteomyelitis, unspecified: Secondary | ICD-10-CM | POA: Insufficient documentation

## 2011-03-28 MED ORDER — TECHNETIUM TC 99M MEDRONATE IV KIT
25.0000 | PACK | Freq: Once | INTRAVENOUS | Status: AC | PRN
  Administered 2011-03-28: 24.1 via INTRAVENOUS

## 2011-03-30 ENCOUNTER — Ambulatory Visit (HOSPITAL_COMMUNITY)
Admission: RE | Admit: 2011-03-30 | Discharge: 2011-03-30 | Disposition: A | Discharge status: Home / Self Care | Payer: 59 | Source: Ambulatory Visit | Attending: Diagnostic Neuroimaging | Admitting: Diagnostic Neuroimaging

## 2011-03-30 DIAGNOSIS — G459 Transient cerebral ischemic attack, unspecified: Secondary | ICD-10-CM | POA: Insufficient documentation

## 2011-03-30 DIAGNOSIS — G3184 Mild cognitive impairment, so stated: Secondary | ICD-10-CM

## 2011-03-30 HISTORY — PX: TOE AMPUTATION: SHX809

## 2011-03-31 ENCOUNTER — Encounter (HOSPITAL_COMMUNITY): Payer: Self-pay | Admitting: Pharmacy Technician

## 2011-04-05 ENCOUNTER — Encounter (HOSPITAL_COMMUNITY)
Admission: RE | Admit: 2011-04-05 | Discharge: 2011-04-05 | Disposition: A | Discharge status: Home / Self Care | Payer: 59 | Source: Ambulatory Visit | Attending: Podiatry | Admitting: Podiatry

## 2011-04-05 ENCOUNTER — Other Ambulatory Visit: Payer: Self-pay

## 2011-04-05 ENCOUNTER — Encounter (HOSPITAL_COMMUNITY): Payer: Self-pay

## 2011-04-05 HISTORY — DX: Anxiety disorder, unspecified: F41.9

## 2011-04-05 HISTORY — DX: Polyneuropathy, unspecified: G62.9

## 2011-04-05 HISTORY — DX: Major depressive disorder, single episode, unspecified: F32.9

## 2011-04-05 HISTORY — DX: Fibromyalgia: M79.7

## 2011-04-05 HISTORY — DX: Depression, unspecified: F32.A

## 2011-04-05 LAB — BASIC METABOLIC PANEL
Chloride: 100 mEq/L (ref 96–112)
GFR calc Af Amer: 77 mL/min — ABNORMAL LOW (ref >90)
Potassium: 4.4 mEq/L (ref 3.5–5.1)

## 2011-04-05 LAB — CBC
HCT: 39.2 % (ref 36.0–46.0)
Platelets: 256 10*3/uL (ref 150–400)
RDW: 12.6 % (ref 11.5–15.5)
WBC: 10.8 10*3/uL — ABNORMAL HIGH (ref 4.0–10.5)

## 2011-04-05 LAB — SURGICAL PCR SCREEN: MRSA, PCR: NEGATIVE

## 2011-04-05 NOTE — Patient Instructions (Addendum)
20 DYNVER CLEMSON  04/05/2011   Your procedure is scheduled on:   04/10/2011  Report to [facility] at  700  AM.  Call this number if you have problems the morning of surgery: [phone]   Remember:   Do not eat food:After Midnight.  Do not drink clear liquids: After Midnight.  Take these medicines the morning of surgery with A SIP OF WATER: norco,paxil,wellbutrin,cardiazem,lisinopril   Do not wear jewelry, make-up or nail polish.  Do not wear lotions, powders, or perfumes. You may wear deodorant.  Do not shave 48 hours prior to surgery.  Do not bring valuables to the hospital.  Contacts, dentures or bridgework may not be worn into surgery.  Leave suitcase in the car. After surgery it may be brought to your room.  For patients admitted to the hospital, checkout time is 11:00 AM the day of discharge.   Patients discharged the day of surgery will not be allowed to drive home.  Name and phone number of your driver: family  Special Instructions: CHG Shower Use Special Wash: 1/2 bottle night before surgery and 1/2 bottle morning of surgery.   Please read over the following fact sheets that you were given: Pain Booklet, MRSA Information, Surgical Site Infection Prevention, Anesthesia Post-op Instructions and Care and Recovery After Surgery PATIENT INSTRUCTIONS POST-ANESTHESIA  IMMEDIATELY FOLLOWING SURGERY:  Do not drive or operate machinery for the first twenty four hours after surgery.  Do not make any important decisions for twenty four hours after surgery or while taking narcotic pain medications or sedatives.  If you develop intractable nausea and vomiting or a severe headache please notify your doctor immediately.  FOLLOW-UP:  Please make an appointment with your surgeon as instructed. You do not need to follow up with anesthesia unless specifically instructed to do so.  WOUND CARE INSTRUCTIONS (if applicable):  Keep a dry clean dressing on the anesthesia/puncture wound site if there  is drainage.  Once the wound has quit draining you may leave it open to air.  Generally you should leave the bandage intact for twenty four hours unless there is drainage.  If the epidural site drains for more than 36-48 hours please call the anesthesia department.  QUESTIONS?:  Please feel free to call your physician or the hospital operator if you have any questions, and they will be happy to assist you.     [facility] Anesthesia Department [address] [place] [phone]

## 2011-04-08 ENCOUNTER — Encounter (HOSPITAL_COMMUNITY): Payer: Self-pay

## 2011-04-10 NOTE — H&P (Signed)
Chief Complaint / History of Present Illness: This 55 year old female returns today for a consultation to discusses the proposed surgery.  She is scheduled for surgery on 04/11/2011 at [facility].  Past Medical History:  Musculoskeletal Hx: (+) osteoarthritis, fibromyalgia.   Endocrine Hx: (+) diabetes.   Cardiovascular Hx: (+) irregular heart beat, hypertension.   Medication History: Active: lantus insulin (active), Bactrim DS (active), Cardizem (active), Paxil (active), Cipro (active), Wellbutrin (active), Humalog (active), lisinopril (active).   Allergies: Patient/Guardian admits allergies to Chantix resulting in depression.   Past Surgical History: Patient/Guardian admits past surgical history of knee replacement (left), knee replacement (right), hysterectomy, thyroid surgery, carpal tunnel surgery, hernia repair.    Social History: Patient/Guardian admits tobacco use. She smokes 1/2 pack per day, Patient/Guardian denies illegal drug use, Patient/Guardian denies alcohol use.   Family History: Patient/Guardian admits a family history of diabetes associated with brother, father, paternal grandfather, paternal grandmother, stroke associated with brother, sister.   Review of Systems: No abnormal findings with the exception of the chief complaint.    Physical Exam: The patient is a pleasant, 55 year old female in no apparent distress.  She is oriented to person, place and time.  She is ambulatory.  She is overweight.  Skin is warm and dry.  There is dorsal contracture of the second digit of the left foot.  The second digit is edematous and red.  There are no ascending signs of cellulitis present beyond the MTPJ.  The third toe is not edematous.  Dorsalis pedis and posterior tibial pulses are palpable.  Capillary refill is brisk.  Protective sensation is decreased as evaluated with Semmes-Weinstein 5.07 monofilament.  Test Results:  None to report at this time.    Impression:  Diabetes  mellitus with peripheral neuropathy.  Osteomyelitis.  Plan:   The podiatric pathology and treatment options were again reviewed with the her.  I discussed with the her the surgical procedure itself, the indications, the risks, possible complications, post-operative course and alternative treatments. I gave no guarantees regarding the outcome. She would like to proceed with the proposed surgery.  An informed consent was obtained.  She is to return for her post-operative appointment or sooner if problems arise.  Prescriptions: Rx: Phenergan- 12.5 mg tablet , Take 1 tablet by mouth every four to six hours as needed. Max daily dose: 6.  Dispense: 21 tablet. Refills: 0.  Allow Generic: Yes Rx: Lortab- 500 mg-7.5 mg tablet, Take 1 tablet by mouth every four to six hours as needed for pain. Max daily dose: 6.  Dispense: 42 tablet. Refills: 0.  Allow Generic: Yes

## 2011-04-11 ENCOUNTER — Encounter (HOSPITAL_COMMUNITY): Payer: Self-pay | Admitting: Anesthesiology

## 2011-04-11 ENCOUNTER — Encounter (HOSPITAL_COMMUNITY): Payer: Self-pay | Admitting: *Deleted

## 2011-04-11 ENCOUNTER — Other Ambulatory Visit: Payer: Self-pay

## 2011-04-11 ENCOUNTER — Ambulatory Visit (HOSPITAL_COMMUNITY): Payer: 59

## 2011-04-11 ENCOUNTER — Ambulatory Visit (HOSPITAL_COMMUNITY)
Admission: RE | Admit: 2011-04-11 | Discharge: 2011-04-11 | Disposition: A | Discharge status: Home / Self Care | Payer: 59 | Source: Ambulatory Visit | Attending: Podiatry | Admitting: Podiatry

## 2011-04-11 ENCOUNTER — Encounter (HOSPITAL_COMMUNITY)
Admission: RE | Disposition: A | Discharge status: Home / Self Care | Payer: Self-pay | Source: Ambulatory Visit | Attending: Podiatry

## 2011-04-11 ENCOUNTER — Ambulatory Visit (HOSPITAL_COMMUNITY): Payer: 59 | Admitting: Anesthesiology

## 2011-04-11 DIAGNOSIS — Z794 Long term (current) use of insulin: Secondary | ICD-10-CM | POA: Insufficient documentation

## 2011-04-11 DIAGNOSIS — Z01812 Encounter for preprocedural laboratory examination: Secondary | ICD-10-CM | POA: Insufficient documentation

## 2011-04-11 DIAGNOSIS — M869 Osteomyelitis, unspecified: Secondary | ICD-10-CM | POA: Insufficient documentation

## 2011-04-11 DIAGNOSIS — I10 Essential (primary) hypertension: Secondary | ICD-10-CM | POA: Insufficient documentation

## 2011-04-11 DIAGNOSIS — Z79899 Other long term (current) drug therapy: Secondary | ICD-10-CM | POA: Insufficient documentation

## 2011-04-11 DIAGNOSIS — E119 Type 2 diabetes mellitus without complications: Secondary | ICD-10-CM | POA: Insufficient documentation

## 2011-04-11 HISTORY — PX: BONE BIOPSY: SHX375

## 2011-04-11 HISTORY — PX: AMPUTATION: SHX166

## 2011-04-11 LAB — GLUCOSE, CAPILLARY
Glucose-Capillary: 207 mg/dL — ABNORMAL HIGH (ref 70–99)
Glucose-Capillary: 157 mg/dL — ABNORMAL HIGH (ref 70–99)

## 2011-04-11 SURGERY — AMPUTATION DIGIT
Anesthesia: Monitor Anesthesia Care | Site: Toe | Laterality: Left | Wound class: Contaminated

## 2011-04-11 MED ORDER — MIDAZOLAM HCL 2 MG/2ML IJ SOLN
1.0000 mg | INTRAMUSCULAR | Status: DC | PRN
  Administered 2011-04-11: 2 mg via INTRAVENOUS

## 2011-04-11 MED ORDER — FENTANYL CITRATE 0.05 MG/ML IJ SOLN: 25.0000 ug | INTRAMUSCULAR | Status: DC | PRN

## 2011-04-11 MED ORDER — FENTANYL CITRATE 0.05 MG/ML IJ SOLN
INTRAMUSCULAR | Status: AC
  Filled 2011-04-11: qty 2

## 2011-04-11 MED ORDER — CEFAZOLIN SODIUM-DEXTROSE 2-3 GM-% IV SOLR
INTRAVENOUS | Status: AC
  Filled 2011-04-11: qty 50

## 2011-04-11 MED ORDER — MIDAZOLAM HCL 2 MG/2ML IJ SOLN
INTRAMUSCULAR | Status: AC
  Administered 2011-04-11: 2 mg via INTRAVENOUS
  Filled 2011-04-11: qty 2

## 2011-04-11 MED ORDER — PROPOFOL 10 MG/ML IV EMUL
INTRAVENOUS | Status: DC | PRN
  Administered 2011-04-11: 25 mg via INTRAVENOUS

## 2011-04-11 MED ORDER — BUPIVACAINE HCL (PF) 0.5 % IJ SOLN
INTRAMUSCULAR | Status: DC | PRN
  Administered 2011-04-11: 10 mL

## 2011-04-11 MED ORDER — CEFAZOLIN SODIUM-DEXTROSE 2-3 GM-% IV SOLR: 2.0000 g | INTRAVENOUS | Status: DC

## 2011-04-11 MED ORDER — FENTANYL CITRATE 0.05 MG/ML IJ SOLN
INTRAMUSCULAR | Status: DC | PRN
  Administered 2011-04-11 (×2): 50 ug via INTRAVENOUS

## 2011-04-11 MED ORDER — LIDOCAINE HCL (PF) 1 % IJ SOLN
INTRAMUSCULAR | Status: AC
  Filled 2011-04-11: qty 5

## 2011-04-11 MED ORDER — LIDOCAINE HCL (PF) 1 % IJ SOLN
INTRAMUSCULAR | Status: AC
  Filled 2011-04-11: qty 30

## 2011-04-11 MED ORDER — ONDANSETRON HCL 4 MG/2ML IJ SOLN: 4.0000 mg | Freq: Once | INTRAMUSCULAR | Status: DC | PRN

## 2011-04-11 MED ORDER — CEFAZOLIN SODIUM 1-5 GM-% IV SOLN
INTRAVENOUS | Status: DC | PRN
  Administered 2011-04-11: 2 g via INTRAVENOUS

## 2011-04-11 MED ORDER — PROPOFOL 10 MG/ML IV EMUL
INTRAVENOUS | Status: AC
  Filled 2011-04-11: qty 20

## 2011-04-11 MED ORDER — PROPOFOL 10 MG/ML IV EMUL
INTRAVENOUS | Status: DC | PRN
  Administered 2011-04-11: 25 ug/kg/min via INTRAVENOUS

## 2011-04-11 MED ORDER — LACTATED RINGERS IV SOLN
INTRAVENOUS | Status: DC
  Administered 2011-04-11: 09:00:00 via INTRAVENOUS

## 2011-04-11 MED ORDER — BUPIVACAINE HCL (PF) 0.5 % IJ SOLN
INTRAMUSCULAR | Status: AC
  Filled 2011-04-11: qty 30

## 2011-04-11 SURGICAL SUPPLY — 60 items
BAG HAMPER (MISCELLANEOUS) ×3 IMPLANT
BANDAGE ELASTIC 4 VELCRO NS (GAUZE/BANDAGES/DRESSINGS) ×3 IMPLANT
BANDAGE ELASTIC 6 VELCRO NS (GAUZE/BANDAGES/DRESSINGS) IMPLANT
BANDAGE ESMARK 4X12 BL STRL LF (DISPOSABLE) ×2 IMPLANT
BANDAGE GAUZE ELAST BULKY 4 IN (GAUZE/BANDAGES/DRESSINGS) ×3 IMPLANT
BENZOIN TINCTURE PRP APPL 2/3 (GAUZE/BANDAGES/DRESSINGS) IMPLANT
BLADE OSC/SAG 11.5X5.5X.38 (BLADE) IMPLANT
BLADE OSC/SAG 18.5X9 THN (BLADE) IMPLANT
BLADE OSC/SAGITTAL MD 5.5X18 (BLADE) IMPLANT
BLADE OSC/SAGITTAL MD 9X18.5 (BLADE) IMPLANT
BLADE SURG 15 STRL LF DISP TIS (BLADE) ×4 IMPLANT
BLADE SURG 15 STRL SS (BLADE) ×2
BNDG ESMARK 4X12 BLUE STRL LF (DISPOSABLE) ×3
CHLORAPREP W/TINT 26ML (MISCELLANEOUS) ×3 IMPLANT
CLOTH BEACON ORANGE TIMEOUT ST (SAFETY) ×3 IMPLANT
COVER LIGHT HANDLE STERIS (MISCELLANEOUS) ×6 IMPLANT
CUFF TOURNIQUET SINGLE 18IN (TOURNIQUET CUFF) ×3 IMPLANT
CUFF TOURNIQUET SINGLE 34IN LL (TOURNIQUET CUFF) IMPLANT
DECANTER SPIKE VIAL GLASS SM (MISCELLANEOUS) ×3 IMPLANT
DRAPE OEC MINIVIEW 54X84 (DRAPES) ×3 IMPLANT
DRSG ADAPTIC 3X8 NADH LF (GAUZE/BANDAGES/DRESSINGS) ×3 IMPLANT
DURA STEPPER LG (CAST SUPPLIES) IMPLANT
DURA STEPPER MED (CAST SUPPLIES) IMPLANT
DURA STEPPER SML (CAST SUPPLIES) IMPLANT
DURA STEPPER XL (SOFTGOODS) IMPLANT
ELECT REM PT RETURN 9FT ADLT (ELECTROSURGICAL) ×3
ELECTRODE REM PT RTRN 9FT ADLT (ELECTROSURGICAL) ×2 IMPLANT
GAUZE SPONGE 4X4 12PLY STRL LF (GAUZE/BANDAGES/DRESSINGS) ×3 IMPLANT
GLOVE BIO SURGEON STRL SZ7.5 (GLOVE) ×3 IMPLANT
GLOVE BIOGEL PI IND STRL 7.0 (GLOVE) ×2 IMPLANT
GLOVE BIOGEL PI IND STRL 8.5 (GLOVE) ×2 IMPLANT
GLOVE BIOGEL PI INDICATOR 7.0 (GLOVE) ×1
GLOVE BIOGEL PI INDICATOR 8.5 (GLOVE) ×1
GLOVE ECLIPSE 6.5 STRL STRAW (GLOVE) ×3 IMPLANT
GLOVE ECLIPSE 8.0 STRL XLNG CF (GLOVE) ×3 IMPLANT
GOWN STRL REIN 3XL LVL4 (GOWN DISPOSABLE) ×3 IMPLANT
GOWN STRL REIN XL XLG (GOWN DISPOSABLE) ×6 IMPLANT
K-WIRE 229MX1.6 (WIRE) IMPLANT
K-WIRE 6 (WIRE)
KIT ROOM TURNOVER APOR (KITS) ×3 IMPLANT
KWIRE 6 (WIRE) IMPLANT
MANIFOLD NEPTUNE II (INSTRUMENTS) ×3 IMPLANT
NS IRRIG 1000ML POUR BTL (IV SOLUTION) ×3 IMPLANT
PACK BASIC LIMB (CUSTOM PROCEDURE TRAY) ×3 IMPLANT
PAD ARMBOARD 7.5X6 YLW CONV (MISCELLANEOUS) ×3 IMPLANT
PAD TELFA 3X4 1S STER (GAUZE/BANDAGES/DRESSINGS) ×3 IMPLANT
PIN CAPS ORTHO GREEN .062 (PIN) IMPLANT
RASP SM TEAR CROSS CUT (RASP) IMPLANT
SET BASIN LINEN APH (SET/KITS/TRAYS/PACK) ×3 IMPLANT
SPONGE GAUZE 4X4 12PLY (GAUZE/BANDAGES/DRESSINGS) ×3 IMPLANT
SPONGE LAP 18X18 X RAY DECT (DISPOSABLE) ×3 IMPLANT
STAPLER VISISTAT (STAPLE) IMPLANT
SUT ETHILON 4 0 PS 2 18 (SUTURE) IMPLANT
SUT MON AB 5-0 PS2 18 (SUTURE) ×3 IMPLANT
SUT PROLENE 4 0 PS 2 18 (SUTURE) ×6 IMPLANT
SUT VIC AB 4-0 PS2 27 (SUTURE) ×3 IMPLANT
SUT VICRYL AB 3 0 TIES (SUTURE) IMPLANT
SYR CONTROL 10ML LL (SYRINGE) ×3 IMPLANT
TRAY ASP BONE MRW 11GX4 J (SET/KITS/TRAYS/PACK) ×3 IMPLANT
WATER STERILE IRR 1000ML POUR (IV SOLUTION) IMPLANT

## 2011-04-11 NOTE — Anesthesia Postprocedure Evaluation (Signed)
  Anesthesia Post-op Note  Patient: [name]  Procedure(s) Performed:  AMPUTATION DIGIT - Amputation Second Toe Left Foot; BONE BIOPSY - Bone Biopsy Left Third Toe   Patient Location: PACU  Anesthesia Type: MAC  Level of Consciousness: awake, alert  and oriented  Airway and Oxygen Therapy: Patient Spontanous Breathing and Patient connected to nasal cannula oxygen  Post-op Pain: none  Post-op Assessment: Post-op Vital signs reviewed, Patient's Cardiovascular Status Stable, No signs of Nausea or vomiting and Adequate PO intake  Post-op Vital Signs: Reviewed and stable  Complications: No apparent anesthesia complications

## 2011-04-11 NOTE — Progress Notes (Signed)
X-ray taken as ordered.

## 2011-04-11 NOTE — Anesthesia Preprocedure Evaluation (Addendum)
Anesthesia Evaluation  Patient identified by MRN, date of birth, ID band Patient awake    Reviewed: Allergy & Precautions, H&P , NPO status , Patient's Chart, lab work & pertinent test results  History of Anesthesia Complications Negative for: history of anesthetic complications  Airway Mallampati: I      Dental  (+) Teeth Intact   Pulmonary Current Smoker,    Pulmonary exam normal       Cardiovascular hypertension, Pt. on medications Atrial Fibrillation Regular Normal    Neuro/Psych PSYCHIATRIC DISORDERS Anxiety Depression  Neuromuscular disease    GI/Hepatic   Endo/Other  Diabetes mellitus-, Poorly Controlled, Type 2, Insulin DependentMorbid obesity  Renal/GU      Musculoskeletal  (+) Fibromyalgia -  Abdominal   Peds  Hematology   Anesthesia Other Findings   Reproductive/Obstetrics                           Anesthesia Physical Anesthesia Plan  ASA: III  Anesthesia Plan: MAC   Post-op Pain Management:    Induction:   Airway Management Planned: Nasal Cannula  Additional Equipment:   Intra-op Plan:   Post-operative Plan:   Informed Consent: I have reviewed the patients History and Physical, chart, labs and discussed the procedure including the risks, benefits and alternatives for the proposed anesthesia with the patient or authorized representative who has indicated his/her understanding and acceptance.     Plan Discussed with:   Anesthesia Plan Comments:         Anesthesia Quick Evaluation

## 2011-04-11 NOTE — Interval H&P Note (Signed)
History and Physical Interval Note:   04/11/2011   8:21 AM   [name]  has presented today for surgery, with the diagnosis of osteomyelitis 2nd toe of the left foot with concern for osteomyelitis of the 3rd toe of the left foot  The various methods of treatment have been discussed with the patient and family. After consideration of risks, benefits and other options for treatment, the patient has consented to  Procedure(s):  1.  Amputation of the 2nd digit of the left foot with possible 2nd ray amputation of the left foot 2.  Bone biopsy of the 3rd digit of the left foot.  as a surgical intervention .  The patients' history has been reviewed, patient examined, no change in status, stable for surgery.  I have reviewed the patients' chart and labs.  Questions were answered to the patient's satisfaction.     Dallas Schimke  MD

## 2011-04-11 NOTE — Brief Op Note (Signed)
04/11/2011  9:46 AM  PATIENT:  [name]  55 y.o. female  PRE-OPERATIVE DIAGNOSIS:  Osteomyelitis Second and Third Toes Left Foot  POST-OPERATIVE DIAGNOSIS:  Osteomyelitis Second Toe Left Foot  PROCEDURE:  1.  Amputation of the Second Toe Left Foot. 2.  Bone Biopsy of the Third Toe Left Foot.  SURGEON:   Dallas Schimke, DPM  ASSISTANTS:  Lucrezia Europe, RN  ANESTHESIA:   MAC with Local  EBL:  Total I/O In: 150 [I.V.:150] Out: 0   BLOOD ADMINISTERED:none  DRAINS: none   LOCAL MEDICATIONS USED:  MARCAINE 10 CC  SPECIMEN:  Biopsy / Limited Resection and Excision  DISPOSITION OF SPECIMEN:  PATHOLOGY  COUNTS:  YES  TOURNIQUET:  Pneumatic Ankle Tourniquet at 250 mmHg  PLAN OF CARE: Discharge to home after PACU  PATIENT DISPOSITION:  Short Stay   Delay start of Pharmacological VTE agent (>24hrs) due to surgical blood loss or risk of bleeding:  No

## 2011-04-11 NOTE — Transfer of Care (Signed)
Immediate Anesthesia Transfer of Care Note  Patient: [name]  Procedure(s) Performed:  AMPUTATION DIGIT - Amputation Second Toe Left Foot; BONE BIOPSY - Bone Biopsy Left Third Toe   Patient Location: PACU  Anesthesia Type: MAC  Level of Consciousness: sedated  Airway & Oxygen Therapy: Patient Spontanous Breathing  Post-op Assessment: Report given to PACU RN  Post vital signs: Reviewed and stable  Complications: No apparent anesthesia complications

## 2011-04-12 NOTE — Op Note (Signed)
[name], [name]            ACCOUNT NO.:  [id number]  MEDICAL RECORD NO.:  [id number]  LOCATION:  APPO                          FACILITY:  [facility]  PHYSICIAN:  B. Theola Sequin, MD   DATE OF BIRTH:  [birth year]  DATE OF PROCEDURE:  04/11/2011 DATE OF DISCHARGE:  04/11/2011                              OPERATIVE REPORT   SURGEON:  B. Theola Sequin, MD  ASSISTANT:  Neva Seat, RN  PREOPERATIVE DIAGNOSIS:  Osteomyelitis second and third digits, left foot.  POSTOPERATIVE DIAGNOSIS:  Osteomyelitis second toe, left foot.  PROCEDURES: 1. Amputation of the second toe, left foot. 2. Bone biopsy of the third toe left foot.  ANESTHESIA:  MAC with local.  HEMOSTASIS:  Pneumatic ankle tourniquet at 250 mmHg.  ESTIMATED BLOOD LOSS:  Minimal (less than 5 mL) injectable 0.5% Marcaine plain.  PATHOLOGY:  Specimen: 1. Second toe, left foot. 2. Bone biopsy, third toe, left foot.  PROCEDURE IN DETAIL:  The patient was brought to the operating room, placed on the operative table in supine position.  Pneumatic ankle tourniquet was placed about the patient's left ankle.  The foot was scrubbed, prepped, and draped in usual sterile manner.  The limb was then elevated, exsanguinated, and the pneumatic ankle tourniquet inflated to 250 mmHg.  Attention was directed to the dorsal aspect of the second digit of the left foot where 2 converging semielliptical incisions were made, encompassing the toe in its entirety.  The incision was continued deep down to the level of the second metatarsophalangeal joint where a transverse tenotomy and capsulotomy was performed.  The toe was disarticulated at the level of metatarsophalangeal joint and passed from the operative field and sent to Pathology for evaluation. The head of the metatarsal was evaluated with no visible erosive changes present.  The wound was irrigated with copious amounts of sterile irrigant.  The skin was reapproximated using 5-0  Monocryl in a running subcuticular manner and reinforced with 4-0 Prolene in a horizontal mattress and simple suture technique.  Attention was then directed to the third toe of the left foot where a small stab incision was made along the base of the proximal phalanx.  Using a bone biopsy trocar, a biopsy of the bone was performed, sent to Pathology for evaluation.  It should be noted that the bone was very firm and there was no open wound of the toe or drainage present.  Skin was reapproximated using 4-0 Prolene in a simple suture technique.  Sterile compressive dressing was applied to the operative foot and pneumatic ankle tourniquet deflated and a prompt hyperemic response was noted to all digits of the operative foot.          ______________________________ B. Theola Sequin, MD     BIM/MEDQ  D:  04/12/2011  T:  04/12/2011  Job:  [id number]

## 2011-04-17 ENCOUNTER — Encounter (HOSPITAL_COMMUNITY): Payer: Self-pay | Admitting: Podiatry

## 2011-04-25 ENCOUNTER — Ambulatory Visit (HOSPITAL_COMMUNITY): Payer: 59 | Admitting: Psychology

## 2011-07-12 ENCOUNTER — Ambulatory Visit (HOSPITAL_COMMUNITY)
Admission: RE | Admit: 2011-07-12 | Discharge: 2011-07-12 | Disposition: A | Discharge status: Home / Self Care | Payer: 59 | Source: Ambulatory Visit | Attending: Pediatrics | Admitting: Pediatrics

## 2011-07-12 ENCOUNTER — Other Ambulatory Visit (HOSPITAL_COMMUNITY): Payer: Self-pay | Admitting: Pediatrics

## 2011-07-12 DIAGNOSIS — M25519 Pain in unspecified shoulder: Secondary | ICD-10-CM | POA: Insufficient documentation

## 2011-07-12 DIAGNOSIS — R52 Pain, unspecified: Secondary | ICD-10-CM

## 2011-10-20 ENCOUNTER — Other Ambulatory Visit (HOSPITAL_COMMUNITY): Payer: Self-pay | Admitting: Orthopedic Surgery

## 2011-10-20 DIAGNOSIS — T84038A Mechanical loosening of other internal prosthetic joint, initial encounter: Secondary | ICD-10-CM

## 2011-10-20 DIAGNOSIS — M25561 Pain in right knee: Secondary | ICD-10-CM

## 2011-10-24 ENCOUNTER — Encounter (HOSPITAL_COMMUNITY)
Admission: RE | Admit: 2011-10-24 | Discharge: 2011-10-24 | Disposition: A | Discharge status: Home / Self Care | Payer: 59 | Source: Ambulatory Visit | Attending: Orthopedic Surgery | Admitting: Orthopedic Surgery

## 2011-10-24 ENCOUNTER — Encounter (HOSPITAL_COMMUNITY): Payer: Self-pay

## 2011-10-24 DIAGNOSIS — Z96659 Presence of unspecified artificial knee joint: Secondary | ICD-10-CM | POA: Insufficient documentation

## 2011-10-24 DIAGNOSIS — T84038A Mechanical loosening of other internal prosthetic joint, initial encounter: Secondary | ICD-10-CM

## 2011-10-24 DIAGNOSIS — M25569 Pain in unspecified knee: Secondary | ICD-10-CM | POA: Insufficient documentation

## 2011-10-24 DIAGNOSIS — M25561 Pain in right knee: Secondary | ICD-10-CM

## 2011-10-24 MED ORDER — TECHNETIUM TC 99M MEDRONATE IV KIT
25.0000 | PACK | Freq: Once | INTRAVENOUS | Status: AC | PRN
  Administered 2011-10-24: 25 via INTRAVENOUS

## 2011-10-25 ENCOUNTER — Other Ambulatory Visit (HOSPITAL_COMMUNITY): Payer: 59

## 2011-11-13 ENCOUNTER — Other Ambulatory Visit (HOSPITAL_COMMUNITY): Payer: Self-pay | Admitting: Pediatrics

## 2011-11-13 DIAGNOSIS — Z09 Encounter for follow-up examination after completed treatment for conditions other than malignant neoplasm: Secondary | ICD-10-CM

## 2011-11-15 ENCOUNTER — Ambulatory Visit (HOSPITAL_COMMUNITY)
Admission: RE | Admit: 2011-11-15 | Discharge: 2011-11-15 | Disposition: A | Discharge status: Home / Self Care | Payer: 59 | Source: Ambulatory Visit | Attending: Pediatrics | Admitting: Pediatrics

## 2011-11-15 DIAGNOSIS — I6529 Occlusion and stenosis of unspecified carotid artery: Secondary | ICD-10-CM | POA: Insufficient documentation

## 2011-11-15 DIAGNOSIS — Z8673 Personal history of transient ischemic attack (TIA), and cerebral infarction without residual deficits: Secondary | ICD-10-CM | POA: Insufficient documentation

## 2011-11-15 DIAGNOSIS — Z09 Encounter for follow-up examination after completed treatment for conditions other than malignant neoplasm: Secondary | ICD-10-CM | POA: Insufficient documentation

## 2012-01-10 ENCOUNTER — Other Ambulatory Visit (HOSPITAL_COMMUNITY): Payer: Self-pay | Admitting: Pediatrics

## 2012-01-10 DIAGNOSIS — Z139 Encounter for screening, unspecified: Secondary | ICD-10-CM

## 2012-01-16 ENCOUNTER — Ambulatory Visit (HOSPITAL_COMMUNITY)
Admission: RE | Admit: 2012-01-16 | Discharge: 2012-01-16 | Disposition: A | Discharge status: Home / Self Care | Payer: 59 | Source: Ambulatory Visit | Attending: Pediatrics | Admitting: Pediatrics

## 2012-01-16 DIAGNOSIS — N63 Unspecified lump in unspecified breast: Secondary | ICD-10-CM | POA: Insufficient documentation

## 2012-01-16 DIAGNOSIS — Z139 Encounter for screening, unspecified: Secondary | ICD-10-CM

## 2012-01-16 DIAGNOSIS — Z1231 Encounter for screening mammogram for malignant neoplasm of breast: Secondary | ICD-10-CM | POA: Insufficient documentation

## 2012-01-18 ENCOUNTER — Other Ambulatory Visit: Payer: Self-pay | Admitting: Pediatrics

## 2012-01-18 DIAGNOSIS — R928 Other abnormal and inconclusive findings on diagnostic imaging of breast: Secondary | ICD-10-CM

## 2012-01-31 ENCOUNTER — Ambulatory Visit (HOSPITAL_COMMUNITY): Payer: 59

## 2012-04-29 ENCOUNTER — Telehealth (HOSPITAL_COMMUNITY): Payer: Self-pay | Admitting: Dietician

## 2012-04-29 NOTE — Telephone Encounter (Signed)
Received referral via email from [place] Mon, [facility] for Link to Wellness, for dx: diabetes.

## 2012-04-29 NOTE — Telephone Encounter (Signed)
Appointment scheduled for 04/30/12 at 2:00 PM.

## 2012-04-30 ENCOUNTER — Encounter (HOSPITAL_COMMUNITY): Payer: Self-pay | Admitting: Dietician

## 2012-04-30 NOTE — Progress Notes (Signed)
Outpatient Initial Nutrition Assessment  Date:04/30/2012   Appt Start Time: 1400  Referring Physician: Crisoforo [facility] [place] [facility], [facility]) Reason for Visit: diabetes  Nutrition Assessment:  Height: 5\' 10"  (177.8 cm)   Weight: 289 lb (131.09 kg)   IBW: 150# %IBW: 193% UBW: 290# %UBW: 100% Body mass index is 41.47 kg/(m^2).  Goal Weight: 260# (10% weight loss) Weight hx: Pt reports her lowest weight was 160# about 39 years ago. Her highest weight was 340#. She lost weight and has maintained the weight loss for 2-3 years. UBW between 280-290#.   Estimated nutritional needs: 2176-2373 kcals daily, 105-131 grams protein daily, 2.2-2.4 L fluid daily  PMH:  Past Medical History  Diagnosis Date  . Hypertension   . Anxiety   . Depression   . Dysrhythmia     hx of A Fib  . Diabetes mellitus     niddm  . Fibromyalgia   . Cancer     uterine  . Neuropathy     Medications:  Current Outpatient Rx  Name  Route  Sig  Dispense  Refill  . BUPROPION HCL ER (XL) 150 MG PO TB24   Oral   Take 150 mg by mouth daily.           Marland Kitchen DILTIAZEM HCL 120 MG PO TABS   Oral   Take 120 mg by mouth daily.           . INSULIN GLARGINE 100 UNIT/ML [place] SOLN   Subcutaneous   Inject 30 Units into the skin daily.           . INSULIN LISPRO (HUMAN) 100 UNIT/ML [place] SOLN   Subcutaneous   Inject 30 Units into the skin daily.           Marland Kitchen LISINOPRIL-HYDROCHLOROTHIAZIDE 10-12.5 MG PO TABS   Oral   Take 1 tablet by mouth daily.           Marland Kitchen PAROXETINE HCL 40 MG PO TABS   Oral   Take 80 mg by mouth every morning.             Labs: CMP     Component Value Date/Time   NA 134* 04/05/2011 1305   K 4.4 04/05/2011 1305   CL 100 04/05/2011 1305   CO2 25 04/05/2011 1305   GLUCOSE 247* 04/05/2011 1305   BUN 19 04/05/2011 1305   CREATININE 0.95 04/05/2011 1305   CALCIUM 10.3 04/05/2011 1305   GFRNONAA 66* 04/05/2011 1305   GFRAA 77* 04/05/2011 1305    Lipid Panel  No results found for this  basename: chol, trig, hdl, cholhdl, vldl, ldlcalc     No results found for this basename: HGBA1C   Lab Results  Component Value Date   CREATININE 0.95 04/05/2011    Last Hgb A1c: 6.9.   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 She normally works Friday or Monday and Saturdays and Sundays. She lives in [place] with her husband, 3 teenagers (ages 36, 57, and 29), and her mother. 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 She reports a high stress level (8/10) due to her and her family's multiple medical problems (upcoming surgery for her mother and daughter, disabled husband, special needs son, and brother and sister with health problems). She is active in her church. She exercises at the [facility] for 1 hour 2-3 times per week (total 2-3 hours per week), participating in swimming, rolling machine, and "peddler" (machine  where arms and hand are used to rotate peddles). She reports she has to be careful which exercises she participates in due to her abdominal hernia and bilateral knee replacements.   Nutrition hx/habits: Arline Asp has had diabetes since 2000 and reports inconsistent control. She reports difficulty recently, as she was unable to afford her insulin, glucometer, and testing strips; she is grateful for the [facility] to help with self-care expenses. She reports no changes to her diet recently. Diet recall reveals generally healthy food choices and low calorie beverages, although she admits to drinking a regular Coke about 2-3 times per week. She follows a regimented meal schedule when she is working, but admits she usually does not eat breakfast during the week, as her children do not usually choose to eat breakfast and it is hard to fit in to the school schedule. She reports her daughter, who likes to cook, will prepare dinner when she is working. Arline Asp will prepare meals during the week.   Diet recall: Work eating schedule: 7 AM: oatmeal from  cafeteria; 10 AM: fruit; 12 PM (lunch): soup from cafeteria; 2-3 PM: salad from cafeteria; 7-7:30 PM: dinner (cooked by 24 year old daughter): chicken alfredo OR tacos OR pork chops OR hamburger/ hot dog (pt will usually only eat entree and not side items) Eating schedule during the week (when not working): 12 PM (Lunch): lean cuisine OR leftover soup OR sandwich; Dinner: meat, vegetable, starch Beverages usually consist of coffee, water, or diet [employer].  Nutrition Diagnosis: Nutrition-related knowledge deficit r/t diabetes education AEB pt with multiple diet related questions.   Nutrition Intervention: Nutrition rx: 1800 kcal NAS, diabetic diet; 3 meals per day (45-60 grams carbohydrate per meal); 1-2 snacks daily (15-30 grams carbohydrate per snack); 2.5 hours exercise per week; low calorie beverages only  Education/Counseling Provided: Educated pt on principles of diabetic diet. Discussed carbohydrate metabolism in relation to diabetes. Educated pt on plate method, portion sizes, and sources of carbohydrate. Discussed importance of regular meal pattern. Discussed importance of adding sources of whole grains to diet to improve glycemic control. Also encouraged to choose low fat dairy, lean meats, and whole fruits and vegetables more often. Educated on food label reading. Discussed nutritional content of foods commonly eaten and discussed healthier alternatives. Discussed importance of compliance to prevent further complications of disease. Educated pt on importance of physical activity (goal of at least 30 minutes 5 times per week) along with a healthy diet to achieve weight loss and glycemic goals. Encouraged slow, moderate weight loss of 1-2# per week, or 7-10% of current body weight. Showed pt functionality of MyFitnessPal app on smartphone. Provided AND handout (Type 2 Diabetes Nutrition Therapy). Also provided plate method, "Your Blood Sugar Diary", "Diabetes and You", and "Carbohydrate  Counting and Meal Planning" handouts. Used TeachBack to assess understanding.   Understanding, Motivation, Ability to Follow Recommendations: Expect fair to good compliance.   Monitoring and Evaluation: Goals: 1) 1-2# weight loss per week; 2) 2.5 hours physical activity per week; 3) Hgb A1c < 7.0  Recommendations: 1) Limit regular soft drinks to once a week; 2) Continue with swimming and other low impact exercises to improve weight and glycemic control; 3) Continue to choose fruits and vegetables as snacks  F/U: PRN. Provided RD contact information.   Melody Haver, RD, LDN 04/30/2012  Appt EndTime: 1500

## 2012-05-08 ENCOUNTER — Emergency Department (HOSPITAL_COMMUNITY): Payer: 59

## 2012-05-08 ENCOUNTER — Encounter (HOSPITAL_COMMUNITY): Payer: Self-pay | Admitting: Emergency Medicine

## 2012-05-08 ENCOUNTER — Emergency Department (HOSPITAL_COMMUNITY)
Admission: EM | Admit: 2012-05-08 | Discharge: 2012-05-08 | Disposition: A | Discharge status: Home / Self Care | Payer: 59 | Attending: Emergency Medicine | Admitting: Emergency Medicine

## 2012-05-08 DIAGNOSIS — I10 Essential (primary) hypertension: Secondary | ICD-10-CM | POA: Insufficient documentation

## 2012-05-08 DIAGNOSIS — Z96659 Presence of unspecified artificial knee joint: Secondary | ICD-10-CM | POA: Insufficient documentation

## 2012-05-08 DIAGNOSIS — J40 Bronchitis, not specified as acute or chronic: Secondary | ICD-10-CM | POA: Insufficient documentation

## 2012-05-08 DIAGNOSIS — R5383 Other fatigue: Secondary | ICD-10-CM

## 2012-05-08 DIAGNOSIS — S98139A Complete traumatic amputation of one unspecified lesser toe, initial encounter: Secondary | ICD-10-CM | POA: Insufficient documentation

## 2012-05-08 DIAGNOSIS — F329 Major depressive disorder, single episode, unspecified: Secondary | ICD-10-CM | POA: Insufficient documentation

## 2012-05-08 DIAGNOSIS — R5381 Other malaise: Secondary | ICD-10-CM | POA: Insufficient documentation

## 2012-05-08 DIAGNOSIS — Z8679 Personal history of other diseases of the circulatory system: Secondary | ICD-10-CM | POA: Insufficient documentation

## 2012-05-08 DIAGNOSIS — R41840 Attention and concentration deficit: Secondary | ICD-10-CM | POA: Insufficient documentation

## 2012-05-08 DIAGNOSIS — Z8542 Personal history of malignant neoplasm of other parts of uterus: Secondary | ICD-10-CM | POA: Insufficient documentation

## 2012-05-08 DIAGNOSIS — IMO0001 Reserved for inherently not codable concepts without codable children: Secondary | ICD-10-CM | POA: Insufficient documentation

## 2012-05-08 DIAGNOSIS — E1149 Type 2 diabetes mellitus with other diabetic neurological complication: Secondary | ICD-10-CM | POA: Insufficient documentation

## 2012-05-08 DIAGNOSIS — F411 Generalized anxiety disorder: Secondary | ICD-10-CM | POA: Insufficient documentation

## 2012-05-08 DIAGNOSIS — F3289 Other specified depressive episodes: Secondary | ICD-10-CM | POA: Insufficient documentation

## 2012-05-08 DIAGNOSIS — Z9889 Other specified postprocedural states: Secondary | ICD-10-CM | POA: Insufficient documentation

## 2012-05-08 DIAGNOSIS — Z79899 Other long term (current) drug therapy: Secondary | ICD-10-CM | POA: Insufficient documentation

## 2012-05-08 DIAGNOSIS — F17200 Nicotine dependence, unspecified, uncomplicated: Secondary | ICD-10-CM | POA: Insufficient documentation

## 2012-05-08 DIAGNOSIS — Z794 Long term (current) use of insulin: Secondary | ICD-10-CM | POA: Insufficient documentation

## 2012-05-08 LAB — RAPID URINE DRUG SCREEN, HOSP PERFORMED
Amphetamines: NOT DETECTED
Benzodiazepines: NOT DETECTED
Opiates: POSITIVE — AB

## 2012-05-08 LAB — URINALYSIS, ROUTINE W REFLEX MICROSCOPIC
Glucose, UA: NEGATIVE mg/dL
Hgb urine dipstick: NEGATIVE
Ketones, ur: NEGATIVE mg/dL
Protein, ur: NEGATIVE mg/dL
pH: 6.5 (ref 5.0–8.0)

## 2012-05-08 LAB — CBC WITH DIFFERENTIAL/PLATELET
Basophils Absolute: 0 10*3/uL (ref 0.0–0.1)
Basophils Relative: 0 % (ref 0–1)
Eosinophils Absolute: 0.3 10*3/uL (ref 0.0–0.7)
Eosinophils Relative: 4 % (ref 0–5)
Lymphs Abs: 2.4 10*3/uL (ref 0.7–4.0)
MCH: 33.2 pg (ref 26.0–34.0)
Neutrophils Relative %: 61 % (ref 43–77)
Platelets: 191 10*3/uL (ref 150–400)
RBC: 3.86 MIL/uL — ABNORMAL LOW (ref 3.87–5.11)
RDW: 12.8 % (ref 11.5–15.5)

## 2012-05-08 LAB — COMPREHENSIVE METABOLIC PANEL
ALT: 15 U/L (ref 0–35)
AST: 11 U/L (ref 0–37)
Albumin: 3.3 g/dL — ABNORMAL LOW (ref 3.5–5.2)
Alkaline Phosphatase: 98 U/L (ref 39–117)
Calcium: 9.7 mg/dL (ref 8.4–10.5)
Glucose, Bld: 214 mg/dL — ABNORMAL HIGH (ref 70–99)
Potassium: 3.9 mEq/L (ref 3.5–5.1)
Sodium: 141 mEq/L (ref 135–145)
Total Protein: 6.8 g/dL (ref 6.0–8.3)

## 2012-05-08 LAB — BLOOD GAS, ARTERIAL
Acid-Base Excess: 1 mmol/L (ref 0.0–2.0)
Drawn by: 23534
FIO2: 0.21 %
O2 Content: 21 L/min
pCO2 arterial: 45 mmHg (ref 35.0–45.0)

## 2012-05-08 LAB — SALICYLATE LEVEL: Salicylate Lvl: <2 mg/dL — ABNORMAL LOW (ref 2.8–20.0)

## 2012-05-08 LAB — ETHANOL: Alcohol, Ethyl (B): <11 mg/dL (ref 0–11)

## 2012-05-08 MED ORDER — ALBUTEROL SULFATE HFA 108 (90 BASE) MCG/ACT IN AERS
2.0000 | INHALATION_SPRAY | RESPIRATORY_TRACT | Status: AC
  Administered 2012-05-08: 2 via RESPIRATORY_TRACT
  Filled 2012-05-08: qty 6.7

## 2012-05-08 MED ORDER — SODIUM CHLORIDE 0.9 % IV SOLN
INTRAVENOUS | Status: DC
  Administered 2012-05-08: 11:00:00 via INTRAVENOUS

## 2012-05-08 MED ORDER — NALOXONE HCL 0.4 MG/ML IJ SOLN
0.4000 mg | Freq: Once | INTRAMUSCULAR | Status: AC
  Administered 2012-05-08: 0.4 mg via INTRAVENOUS
  Filled 2012-05-08: qty 1

## 2012-05-08 NOTE — ED Provider Notes (Signed)
History     CSN: [id number]  Arrival date & time 05/08/12  1022   First MD Initiated Contact with Patient 05/08/12 1037      Chief Complaint  Patient presents with  . Altered Mental Status     HPI Pt was seen at 1040.   Per pt, c/o gradual onset and persistence of constant "slurred speech," "feeling slow," and "having a hard time concentrating" that began when she woke up this morning.  Pt states she took a xanax and vicodin together last evening (does not usually do that). Denies CP/palpitations, no SOB/cough, no abd pain, no N/V/D, no back pain, no facial droop, no focal motor weakness, no tingling/numbness in extremities, no SI/SA.    Past Medical History  Diagnosis Date  . Hypertension   . Anxiety   . Depression   . Dysrhythmia     hx of A Fib  . Diabetes mellitus     niddm  . Fibromyalgia   . Cancer     uterine  . Neuropathy     Past Surgical History  Procedure Date  . Cholecystectomy 2007  . Tubal ligation 1978  . Abdominal hysterectomy 2000  . Thyroid cyst excision 1996    removal of nodule  . Knee arthroscopy     left knee-multiple  . Knee arthroscopy 2000    right knee  . Replacement total knee bilateral     2006  . Carpal tunnel release 2003    bilateral   . Hernia repair 2007    incisional hernia repair x2  . Amputation 04/11/2011    Procedure: AMPUTATION DIGIT;  Surgeon: Dallas Schimke;  Location: [facility];  Service: Orthopedics;  Laterality: Left;  Amputation Second Toe Left Foot  . Bone biopsy 04/11/2011    Procedure: BONE BIOPSY;  Surgeon: Dallas Schimke;  Location: [facility];  Service: Orthopedics;  Laterality: Left;  Bone Biopsy Left Foot Third Toe     Family History  Problem Relation Age of Onset  . Anesthesia problems Neg Hx   . Hypotension Neg Hx   . Malignant hyperthermia Neg Hx   . Pseudochol deficiency Neg Hx     History  Substance Use Topics  . Smoking status: Current Every Day Smoker -- 0.2 packs/day for 40 years     Types: Cigarettes  . Smokeless tobacco: Not on file  . Alcohol Use: No      Review of Systems ROS: Statement: All systems negative except as marked or noted in the HPI; Constitutional: Negative for fever and chills. ; ; Eyes: Negative for eye pain, redness and discharge. ; ; ENMT: Negative for ear pain, hoarseness, nasal congestion, sinus pressure and sore throat. ; ; Cardiovascular: Negative for chest pain, palpitations, diaphoresis, dyspnea and peripheral edema. ; ; Respiratory: Negative for cough, wheezing and stridor. ; ; Gastrointestinal: Negative for nausea, vomiting, diarrhea, abdominal pain, blood in stool, hematemesis, jaundice and rectal bleeding. . ; ; Genitourinary: Negative for dysuria, flank pain and hematuria. ; ; Musculoskeletal: Negative for back pain and neck pain. Negative for swelling and trauma.; ; Skin: Negative for pruritus, rash, abrasions, blisters, bruising and skin lesion.; ; Neuro: +slurred speech, "hard time concentrating."  Negative for headache, lightheadedness and neck stiffness. Negative for weakness, altered level of consciousness , altered mental status, extremity weakness, paresthesias, involuntary movement, seizure and syncope.; Psych:  No SI, no SA, no HI, no hallucinations.        Allergies  Chantix and Codeine  Home Medications  Current Outpatient Rx  Name  Route  Sig  Dispense  Refill  . BUPROPION HCL ER (XL) 150 MG PO TB24   Oral   Take 150 mg by mouth daily.           Marland Kitchen DILTIAZEM HCL 120 MG PO TABS   Oral   Take 120 mg by mouth daily.           . INSULIN GLARGINE 100 UNIT/ML [place] SOLN   Subcutaneous   Inject 30 Units into the skin daily.           . INSULIN LISPRO (HUMAN) 100 UNIT/ML [place] SOLN   Subcutaneous   Inject 30 Units into the skin daily.           Marland Kitchen LISINOPRIL-HYDROCHLOROTHIAZIDE 10-12.5 MG PO TABS   Oral   Take 1 tablet by mouth daily.           Marland Kitchen PAROXETINE HCL 40 MG PO TABS   Oral   Take 80 mg by mouth every  morning.             BP 156/74  Pulse 64  Resp 22  Ht 5\' 10"  (1.778 m)  Wt 285 lb (129.275 kg)  BMI 40.89 kg/m2  SpO2 97%  Physical Exam 1055: Physical examination:  Nursing notes reviewed; Vital signs and O2 SAT reviewed;  Constitutional: Well developed, Well nourished, Well hydrated, In no acute distress; Head:  Normocephalic, atraumatic; Eyes: EOMI, PERRL, No scleral icterus; ENMT: Mouth and pharynx normal, Mucous membranes moist; Neck: Supple, Full range of motion, No lymphadenopathy; Cardiovascular: Regular rate and rhythm, No murmur, rub, or gallop; Respiratory: Breath sounds clear & equal bilaterally, No rales, rhonchi, wheezes.  Speaking full sentences with ease, Normal respiratory effort/excursion; Chest: Nontender, Movement normal; Abdomen: Soft, Nontender, Nondistended, Normal bowel sounds; Genitourinary: No CVA tenderness; Extremities: Pulses normal, No tenderness, No edema, No calf edema or asymmetry.; Neuro: AA&Ox3, Major CN grossly intact.  Strength 5/5 equal bilat UE's and LE's.  DTR 2/4 equal bilat UE's and LE's.  No gross sensory deficits.  Normal cerebellar testing bilat UE's (finger-nose) and LE's (heel-shin). Speech slurred.  No facial droop.  No nystagmus.;; Skin: Color normal, Warm, Dry.   ED Course  Procedures   1100:  Pt is not code stroke as she woke up with symptoms (unk LSN/exact time of onset).  States she took a vicodin (chronic pain) and xanax (anxiety) last night to go to bed. Denies SI/SA.   1520:  Pt has been sleeping most of her ED visit.  No change in mental status with narcan IV.  Pt remains easily arousable by staff and family.  No focal neuro deficits. Not orthostatic. Pt has tol PO well without gagging, choking or N/V.  No acute findings on workup today to explain pt's lethargy, other than opiates on UDS and pt's self admission of taking benzo + opiate last night.  Will tx bronchitis on CXR with MDI.  Offered admission, but wants to go home now and  family wants to take her home.  Dx and testing d/w pt and family.  Questions answered.  Verb understanding, agreeable to d/c home with outpt f/u.    MDM  MDM Reviewed: previous chart, nursing note and vitals Reviewed previous: ECG and labs Interpretation: ECG, labs, x-ray, CT scan and MRI    Date: 05/08/2012  Rate: 58  Rhythm: normal sinus rhythm  QRS Axis: normal  Intervals: normal  ST/T Wave abnormalities: normal  Conduction Disutrbances:none  Narrative Interpretation:   Old EKG Reviewed: unchanged; no significant changes from previous EKG dated 04/05/2011.   Results for orders placed during the hospital encounter of 05/08/12  ACETAMINOPHEN LEVEL      Component Value Range   Acetaminophen (Tylenol), Serum <15.0  10 - 30 ug/mL  SALICYLATE LEVEL      Component Value Range   Salicylate Lvl <2.0 (*) 2.8 - 20.0 mg/dL  ETHANOL      Component Value Range   Alcohol, Ethyl (B) <11  0 - 11 mg/dL  URINE RAPID DRUG SCREEN (HOSP PERFORMED)      Component Value Range   Opiates POSITIVE (*) NONE DETECTED   Cocaine NONE DETECTED  NONE DETECTED   Benzodiazepines NONE DETECTED  NONE DETECTED   Amphetamines NONE DETECTED  NONE DETECTED   Tetrahydrocannabinol NONE DETECTED  NONE DETECTED   Barbiturates NONE DETECTED  NONE DETECTED  URINALYSIS, ROUTINE W REFLEX MICROSCOPIC      Component Value Range   Color, Urine YELLOW  YELLOW   APPearance CLEAR  CLEAR   Specific Gravity, Urine 1.025  1.005 - 1.030   pH 6.5  5.0 - 8.0   Glucose, UA NEGATIVE  NEGATIVE mg/dL   Hgb urine dipstick NEGATIVE  NEGATIVE   Bilirubin Urine NEGATIVE  NEGATIVE   Ketones, ur NEGATIVE  NEGATIVE mg/dL   Protein, ur NEGATIVE  NEGATIVE mg/dL   Urobilinogen, UA 0.2  0.0 - 1.0 mg/dL   Nitrite NEGATIVE  NEGATIVE   Leukocytes, UA NEGATIVE  NEGATIVE  CBC WITH DIFFERENTIAL      Component Value Range   WBC 8.2  4.0 - 10.5 K/uL   RBC 3.86 (*) 3.87 - 5.11 MIL/uL   Hemoglobin 12.8  12.0 - 15.0 g/dL   HCT 01.0  27.2 -  53.6 %   MCV 95.3  78.0 - 100.0 fL   MCH 33.2  26.0 - 34.0 pg   MCHC 34.8  30.0 - 36.0 g/dL   RDW 64.4  03.4 - 74.2 %   Platelets 191  150 - 400 K/uL   Neutrophils Relative 61  43 - 77 %   Neutro Abs 5.0  1.7 - 7.7 K/uL   Lymphocytes Relative 29  12 - 46 %   Lymphs Abs 2.4  0.7 - 4.0 K/uL   Monocytes Relative 6  3 - 12 %   Monocytes Absolute 0.5  0.1 - 1.0 K/uL   Eosinophils Relative 4  0 - 5 %   Eosinophils Absolute 0.3  0.0 - 0.7 K/uL   Basophils Relative 0  0 - 1 %   Basophils Absolute 0.0  0.0 - 0.1 K/uL  COMPREHENSIVE METABOLIC PANEL      Component Value Range   Sodium 141  135 - 145 mEq/L   Potassium 3.9  3.5 - 5.1 mEq/L   Chloride 107  96 - 112 mEq/L   CO2 26  19 - 32 mEq/L   Glucose, Bld 214 (*) 70 - 99 mg/dL   BUN 13  6 - 23 mg/dL   Creatinine, Ser 5.95  0.50 - 1.10 mg/dL   Calcium 9.7  8.4 - 63.8 mg/dL   Total Protein 6.8  6.0 - 8.3 g/dL   Albumin 3.3 (*) 3.5 - 5.2 g/dL   AST 11  0 - 37 U/L   ALT 15  0 - 35 U/L   Alkaline Phosphatase 98  39 - 117 U/L   Total Bilirubin 0.2 (*) 0.3 - 1.2 mg/dL  GFR calc non Af Amer >90  >90 mL/min   GFR calc Af Amer >90  >90 mL/min  TROPONIN I      Component Value Range   Troponin I <0.30  <0.30 ng/mL  GLUCOSE, CAPILLARY      Component Value Range   Glucose-Capillary 206 (*) 70 - 99 mg/dL  BLOOD GAS, ARTERIAL      Component Value Range   FIO2 0.21     O2 Content 21.0     Delivery systems ROOM AIR     pH, Arterial 7.373  7.350 - 7.450   pCO2 arterial 45.0  35.0 - 45.0 mmHg   pO2, Arterial 71.5 (*) 80.0 - 100.0 mmHg   Bicarbonate 25.6 (*) 20.0 - 24.0 mEq/L   TCO2 23.1  0 - 100 mmol/L   Acid-Base Excess 1.0  0.0 - 2.0 mmol/L   O2 Saturation 95.2     Patient temperature 37.0     Collection site LEFT RADIAL     Drawn by [id number]     Sample type ARTERIAL     Allens test (pass/fail) PASS  PASS   Dg Chest 1 View 05/08/2012  *RADIOLOGY REPORT*  Clinical Data: Acute mental status changes with lethargy and slurred speech.  CHEST  - 1 VIEW  Comparison: Two-view chest x-ray 12/03/2006.  Findings: Cardiomediastinal silhouette unremarkable, unchanged. Prominent bronchovascular markings diffusely and moderate central peribronchial thickening, more so than on the prior examinations. No localized airspace consolidation.  No visible pleural effusions.  IMPRESSION: Moderate changes of bronchitis and/or asthma which may be acute or chronic.   Original Report Authenticated By: Hulan Saas, M.D.    Ct Head Wo Contrast 05/08/2012  *RADIOLOGY REPORT*  Clinical Data: Altered mental status, slurred speech.  CT HEAD WITHOUT CONTRAST  Technique:  Contiguous axial images were obtained from the base of the skull through the vertex without contrast.  Comparison: MRI scan of March 30, 2011.  Findings: Bony calvarium appears intact.  No mass effect or midline shift is noted.  Ventricular size is within normal limits.  There is no evidence of mass lesion, hemorrhage or acute infarction.  IMPRESSION: No acute intracranial abnormality seen.   Original Report Authenticated By: Lupita Raider.,  M.D.    Mr Brain Wo Contrast 05/08/2012  *RADIOLOGY REPORT*  Clinical Data: Slurred speech.  MRI HEAD WITHOUT CONTRAST  Technique:  Multiplanar, multiecho pulse sequences of the brain and surrounding structures were obtained according to standard protocol without intravenous contrast.  Comparison: CT head earlier in the day.  Findings: The patient had difficulty remaining motionless for the study.  Images are suboptimal.  Small or subtle lesions could be overlooked.  There is no evidence for acute infarction, intracranial hemorrhage, mass lesion, hydrocephalus, or extra-axial fluid.  There is no significant atrophy.  There may be minimal white matter disease, not of sufficient extent or severity to suggest  significant small vessel type changes.  Tiny remote lacunar infarct right putamen. No foci of chronic hemorrhage.   No midline shift.  Major intracranial vessels  structures patent.  Normal appearing midline structures.  No definite acute sinus or mastoid disease. Negative orbits.  Mild mucosal thickening left division sphenoid.  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  No acute intracranial abnormality.  See comments above.   Original Report Authenticated By: Davonna Belling, M.D.               Laray Anger, DO 05/10/12 1232

## 2012-05-08 NOTE — ED Notes (Signed)
Pt to MRI

## 2012-05-08 NOTE — ED Notes (Signed)
Pt c/o waking up feeling "slow" and has felt progressively "slower" through the day. Pt c/o "feeling wobbly" and having a hard time concentrating. Pt also c/o "having a hard time finding words and slow to speak words". Some slurring noted. Pt alert and oriented x 4.

## 2012-05-08 NOTE — ED Notes (Signed)
Family at bedside. 

## 2012-05-08 NOTE — ED Notes (Signed)
Patient would like something to drink. RN made aware. RN says to wait just a little bit.

## 2012-05-10 LAB — URINE CULTURE: Colony Count: >=100000

## 2012-11-01 ENCOUNTER — Encounter: Payer: Self-pay | Admitting: *Deleted

## 2012-11-01 ENCOUNTER — Other Ambulatory Visit: Payer: Self-pay | Admitting: *Deleted

## 2012-11-01 MED ORDER — PAROXETINE HCL 20 MG PO TABS: 20.0000 mg | ORAL_TABLET | Freq: Every day | ORAL | Status: DC

## 2012-11-28 ENCOUNTER — Other Ambulatory Visit: Payer: Self-pay | Admitting: Family Medicine

## 2012-12-10 ENCOUNTER — Ambulatory Visit (INDEPENDENT_AMBULATORY_CARE_PROVIDER_SITE_OTHER): Payer: 59 | Admitting: Family Medicine

## 2012-12-10 ENCOUNTER — Encounter: Payer: Self-pay | Admitting: Family Medicine

## 2012-12-10 VITALS — BP 132/90 | HR 70 | Wt 293.2 lb

## 2012-12-10 DIAGNOSIS — E119 Type 2 diabetes mellitus without complications: Secondary | ICD-10-CM

## 2012-12-10 DIAGNOSIS — Z79899 Other long term (current) drug therapy: Secondary | ICD-10-CM

## 2012-12-10 DIAGNOSIS — E785 Hyperlipidemia, unspecified: Secondary | ICD-10-CM

## 2012-12-10 DIAGNOSIS — I10 Essential (primary) hypertension: Secondary | ICD-10-CM

## 2012-12-10 DIAGNOSIS — E11610 Type 2 diabetes mellitus with diabetic neuropathic arthropathy: Secondary | ICD-10-CM

## 2012-12-10 DIAGNOSIS — E1149 Type 2 diabetes mellitus with other diabetic neurological complication: Secondary | ICD-10-CM

## 2012-12-10 MED ORDER — PRAVASTATIN SODIUM 20 MG PO TABS: 20.0000 mg | ORAL_TABLET | Freq: Every day | ORAL | Status: DC

## 2012-12-10 MED ORDER — PAROXETINE HCL 20 MG PO TABS: 80.0000 mg | ORAL_TABLET | ORAL | Status: DC

## 2012-12-10 MED ORDER — LISINOPRIL 5 MG PO TABS: 5.0000 mg | ORAL_TABLET | Freq: Every day | ORAL | Status: DC

## 2012-12-10 NOTE — Progress Notes (Signed)
  Subjective:    Patient ID: [name], female    DOB: [birth year], 57 y.o.   MRN: [id number]  Diabetes She presents for her follow-up diabetic visit. She has type 2 diabetes mellitus. Her disease course has been improving. There are no hypoglycemic associated symptoms. There are no diabetic associated symptoms. There are no hypoglycemic complications. Symptoms are stable. There are no diabetic complications. There are no known risk factors for coronary artery disease. She is compliant with treatment all of the time. She is following a generally healthy diet. When asked about meal planning, she reported none.  Patient had HgbA1C done yesterday and it was 7.3. Patient states that she has a insect bite on her right leg she needs looked at.  Her A1c was improved compared where was that seems to be getting a little worse now she relates morning sugars are in the 120-140 range. In addition to this she also states insect bite inside leg soreness with it but no cellulitis. No fever or chills no vomiting. Has history of anxiety/depression/hypertension/diabetes/hyperlipidemia family history noncontributory social smoke she knows she needs to quit she is planning on doing so within the next couple months.   Review of Systems She denies chest tightness pressure pain denies vomiting diarrhea rectal bleeding denies PND   she does relate that she has Charcot foot as well as neuropathy in the foot. Objective:   Physical Exam Lungs are clear hearts regular pulse normal blood pressure good extremities no edema skin warm dry neurologic grossly normal foot exam abnormal she has removed toe on the right side she has subjective discomfort in the foot due to Charcot foot       Assessment & Plan:  #1 abnormal foot exam with neuropathy Charcot foot and previous amputation she needs diabetic shoes #2 diabetes subpar control discussed adjusting Lantus as well as fasting sugars to keep under 120 and keep postmeal  sugars under 152 hours after meal. In addition to this talked with patient about the importance of following up again 3-4 months #3 check lab work #4 hyperlipidemia continue medication check lab work #5 anxiety depression continue medication Paxil 20 mg 4 tablets daily

## 2013-02-02 ENCOUNTER — Emergency Department (HOSPITAL_COMMUNITY): Payer: 59

## 2013-02-02 ENCOUNTER — Encounter (HOSPITAL_COMMUNITY): Payer: Self-pay | Admitting: Emergency Medicine

## 2013-02-02 ENCOUNTER — Observation Stay (HOSPITAL_COMMUNITY)
Admission: EM | Admit: 2013-02-02 | Discharge: 2013-02-03 | Disposition: A | Discharge status: Home / Self Care | Payer: 59 | Attending: Family Medicine | Admitting: Family Medicine

## 2013-02-02 DIAGNOSIS — F411 Generalized anxiety disorder: Secondary | ICD-10-CM | POA: Insufficient documentation

## 2013-02-02 DIAGNOSIS — Z96659 Presence of unspecified artificial knee joint: Secondary | ICD-10-CM | POA: Insufficient documentation

## 2013-02-02 DIAGNOSIS — F3289 Other specified depressive episodes: Secondary | ICD-10-CM | POA: Insufficient documentation

## 2013-02-02 DIAGNOSIS — S98139A Complete traumatic amputation of one unspecified lesser toe, initial encounter: Secondary | ICD-10-CM | POA: Insufficient documentation

## 2013-02-02 DIAGNOSIS — I4891 Unspecified atrial fibrillation: Secondary | ICD-10-CM | POA: Insufficient documentation

## 2013-02-02 DIAGNOSIS — Z79899 Other long term (current) drug therapy: Secondary | ICD-10-CM | POA: Insufficient documentation

## 2013-02-02 DIAGNOSIS — I10 Essential (primary) hypertension: Secondary | ICD-10-CM | POA: Diagnosis present

## 2013-02-02 DIAGNOSIS — R079 Chest pain, unspecified: Principal | ICD-10-CM

## 2013-02-02 DIAGNOSIS — E785 Hyperlipidemia, unspecified: Secondary | ICD-10-CM | POA: Diagnosis present

## 2013-02-02 DIAGNOSIS — F17200 Nicotine dependence, unspecified, uncomplicated: Secondary | ICD-10-CM | POA: Insufficient documentation

## 2013-02-02 DIAGNOSIS — E119 Type 2 diabetes mellitus without complications: Secondary | ICD-10-CM | POA: Insufficient documentation

## 2013-02-02 DIAGNOSIS — IMO0001 Reserved for inherently not codable concepts without codable children: Secondary | ICD-10-CM | POA: Diagnosis present

## 2013-02-02 DIAGNOSIS — E669 Obesity, unspecified: Secondary | ICD-10-CM | POA: Insufficient documentation

## 2013-02-02 DIAGNOSIS — J189 Pneumonia, unspecified organism: Secondary | ICD-10-CM

## 2013-02-02 DIAGNOSIS — Z8542 Personal history of malignant neoplasm of other parts of uterus: Secondary | ICD-10-CM | POA: Insufficient documentation

## 2013-02-02 DIAGNOSIS — Z794 Long term (current) use of insulin: Secondary | ICD-10-CM | POA: Insufficient documentation

## 2013-02-02 DIAGNOSIS — R0602 Shortness of breath: Secondary | ICD-10-CM | POA: Insufficient documentation

## 2013-02-02 DIAGNOSIS — F329 Major depressive disorder, single episode, unspecified: Secondary | ICD-10-CM | POA: Insufficient documentation

## 2013-02-02 DIAGNOSIS — M129 Arthropathy, unspecified: Secondary | ICD-10-CM | POA: Insufficient documentation

## 2013-02-02 HISTORY — DX: Unspecified osteoarthritis, unspecified site: M19.90

## 2013-02-02 LAB — CBC WITH DIFFERENTIAL/PLATELET
MCH: 33.7 pg (ref 26.0–34.0)
MCHC: 36 g/dL (ref 30.0–36.0)
MCV: 93.7 fL (ref 78.0–100.0)
Monocytes Absolute: 0.6 10*3/uL (ref 0.1–1.0)
Platelets: 210 10*3/uL (ref 150–400)
RDW: 12.5 % (ref 11.5–15.5)
WBC: 10.9 10*3/uL — ABNORMAL HIGH (ref 4.0–10.5)

## 2013-02-02 LAB — BASIC METABOLIC PANEL
BUN: 18 mg/dL (ref 6–23)
CO2: 24 mEq/L (ref 19–32)
Chloride: 101 mEq/L (ref 96–112)
Creatinine, Ser: 0.67 mg/dL (ref 0.50–1.10)
GFR calc Af Amer: >90 mL/min (ref >90)
Glucose, Bld: 170 mg/dL — ABNORMAL HIGH (ref 70–99)
Potassium: 3.6 mEq/L (ref 3.5–5.1)

## 2013-02-02 MED ORDER — ONDANSETRON HCL 4 MG/2ML IJ SOLN: 4.0000 mg | Freq: Three times a day (TID) | INTRAMUSCULAR | Status: DC | PRN

## 2013-02-02 MED ORDER — ASPIRIN 81 MG PO CHEW
CHEWABLE_TABLET | ORAL | Status: AC
  Filled 2013-02-02: qty 2

## 2013-02-02 MED ORDER — ASPIRIN 81 MG PO CHEW
324.0000 mg | CHEWABLE_TABLET | Freq: Once | ORAL | Status: AC
  Administered 2013-02-02: 324 mg via ORAL
  Filled 2013-02-02: qty 4

## 2013-02-02 MED ORDER — NITROGLYCERIN 0.4 MG SL SUBL
0.4000 mg | SUBLINGUAL_TABLET | SUBLINGUAL | Status: AC | PRN
  Administered 2013-02-02 (×3): 0.4 mg via SUBLINGUAL
  Filled 2013-02-02: qty 25

## 2013-02-02 MED ORDER — MORPHINE SULFATE 4 MG/ML IJ SOLN
4.0000 mg | INTRAMUSCULAR | Status: DC | PRN
  Administered 2013-02-02: 4 mg via INTRAVENOUS
  Filled 2013-02-02: qty 1

## 2013-02-02 NOTE — ED Provider Notes (Signed)
CSN: [id number]     Arrival date & time 02/02/13  2107 History   First MD Initiated Contact with Patient 02/02/13 2143     Chief Complaint  Patient presents with  . Chest Pain    HPI Pt was seen at 2200. Per pt, c/o gradual onset and worsening of persistent left sided chest and back "pain" since approx 1930 PTA. Pt states she initially had left sided scapular area "pain," which has gradually worsened and now is located also in her left chest. States the discomfort radiates down her left arm. Describes her discomfort as "tightness." Has been associated with SOB and nausea. Denies hx of similar pain. Denies GI hx.  Denies abd pain, no vomiting/diarrhea, no palpitations, no cough, no fevers, no rash, no injury.     Past Medical History  Diagnosis Date  . Hypertension   . Anxiety   . Depression   . Dysrhythmia     hx of A Fib  . Diabetes mellitus     niddm  . Fibromyalgia   . Cancer     uterine  . Neuropathy   . Hyperlipidemia   . Atrial fibrillation   . Degenerative joint disease   . History of stress test 2007    NM stress test negative   Past Surgical History  Procedure Laterality Date  . Cholecystectomy  2007  . Tubal ligation  1978  . Abdominal hysterectomy  2000  . Thyroid cyst excision  1996    removal of nodule  . Knee arthroscopy      left knee-multiple  . Knee arthroscopy  2000    right knee  . Replacement total knee bilateral      2006  . Carpal tunnel release  2003    bilateral   . Hernia repair  2007    incisional hernia repair x2  . 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 Bone biopsy  04/11/2011    Procedure: BONE BIOPSY;  Surgeon: Dallas Schimke;  Location: [facility];  Service: Orthopedics;  Laterality: Left;  Bone Biopsy Left Foot Third Toe   . Colonoscopy    . Toe amputation  November 2012   Family  History  Problem Relation Age of Onset  . Anesthesia problems Neg Hx   . Hypotension Neg Hx   . Malignant hyperthermia Neg Hx   . Pseudochol deficiency Neg Hx    History  Substance Use Topics  . Smoking status: Current Every Day Smoker -- 0.25 packs/day for 40 years    Types: Cigarettes  . Smokeless tobacco: Not on file  . Alcohol Use: No    Review of Systems ROS: Statement: All systems negative except as marked or noted in the HPI; Constitutional: Negative for fever and chills. ; ; Eyes: Negative for eye pain, redness and discharge. ; ; ENMT: Negative for ear pain, hoarseness, nasal congestion, sinus pressure and sore throat. ; ; Cardiovascular: +CP, SOB. Negative for palpitations, diaphoresis, and peripheral edema. ; ; Respiratory: Negative for cough, wheezing and stridor. ; ; Gastrointestinal: +nausea. Negative for vomiting, diarrhea, abdominal pain, blood in stool, hematemesis, jaundice and rectal bleeding. . ; ; Genitourinary: Negative for dysuria, flank pain and hematuria. ; ; Musculoskeletal: +back pain. Negative for neck pain. Negative for swelling and trauma.; ; Skin: Negative for pruritus, rash, abrasions, blisters, bruising and skin lesion.; ;  Neuro: Negative for headache, lightheadedness and neck stiffness. Negative for weakness, altered level of consciousness , altered mental status, extremity weakness, paresthesias, involuntary movement, seizure and syncope.      Allergies  Chantix and Codeine  Home Medications   Current Outpatient Rx  Name  Route  Sig  Dispense  Refill  . diltiazem (CARDIZEM) 120 MG tablet   Oral   Take 120 mg by mouth daily.           Marland Kitchen HYDROcodone-acetaminophen (NORCO/VICODIN) 5-325 MG per tablet   Oral   Take 1 tablet by mouth every 6 (six) hours as needed. Pain         . ibuprofen (ADVIL,MOTRIN) 200 MG tablet   Oral   Take 800 mg by mouth every 6 (six) hours as needed. Pain         . insulin glargine (LANTUS) 100 UNIT/ML injection    Subcutaneous   Inject 30 Units into the skin daily.           . insulin lispro (HUMALOG) 100 UNIT/ML injection   Subcutaneous   Inject 10 Units into the skin 3 (three) times daily.          Marland Kitchen lisinopril (PRINIVIL,ZESTRIL) 5 MG tablet   Oral   Take 1 tablet (5 mg total) by mouth daily. Take 1/2 everyday   90 tablet   2   . Multiple Vitamin (MULTIVITAMIN) tablet   Oral   Take 1 tablet by mouth daily.         Marland Kitchen PARoxetine (PAXIL) 20 MG tablet   Oral   Take 4 tablets (80 mg total) by mouth every morning.   360 tablet   2   . pravastatin (PRAVACHOL) 20 MG tablet   Oral   Take 1 tablet (20 mg total) by mouth daily.   90 tablet   2   . Sodium & Potassium Bicarbonate (ALKA-AID) 360-180 MG TABS   Oral   Take 2 tablets by mouth every 4 (four) hours as needed.         . traMADol (ULTRAM) 50 MG tablet   Oral   Take 50 mg by mouth every 6 (six) hours as needed for pain.          BP 141/75  Pulse 70  Temp(Src) 97.8 F (36.6 C) (Oral)  Resp 20  Ht 5\' 10"  (1.778 m)  Wt 285 lb (129.275 kg)  BMI 40.89 kg/m2  SpO2 93% Physical Exam 2205: Physical examination:  Nursing notes reviewed; Vital signs and O2 SAT reviewed;  Constitutional: Well developed, Well nourished, Well hydrated, Uncomfortable appearing; Head:  Normocephalic, atraumatic; Eyes: EOMI, PERRL, No scleral icterus; ENMT: Mouth and pharynx normal, Mucous membranes moist; Neck: Supple, Full range of motion, No lymphadenopathy; Cardiovascular: Regular rate and rhythm, No gallop; Respiratory: Breath sounds clear & equal bilaterally, No rales, rhonchi, wheezes.  Speaking full sentences with ease, Normal respiratory effort/excursion; Chest: Nontender, Movement normal; Abdomen: Soft, Nontender, Nondistended, Normal bowel sounds; Genitourinary: No CVA tenderness; Extremities: Pulses normal, No tenderness, No edema, No calf edema or asymmetry.; Neuro: AA&Ox3, Major CN grossly intact.  Speech clear. No gross focal motor or  sensory deficits in extremities.; Skin: Color normal, Warm, Dry.   ED Course  Procedures     MDM  MDM Reviewed: previous chart, nursing note and vitals Reviewed previous: ECG and labs Interpretation: ECG, labs and x-ray    Date: 02/02/2013  Rate: 74  Rhythm: normal sinus rhythm  QRS Axis: normal  Intervals:  normal  ST/T Wave abnormalities: normal  Conduction Disutrbances:none  Narrative Interpretation:   Old EKG Reviewed: unchanged; no significant changes from previous EKG dated 04/05/2011.  Results for orders placed during the hospital encounter of 02/02/13  CBC WITH DIFFERENTIAL      Result Value Range   WBC 10.9 (*) 4.0 - 10.5 K/uL   RBC 4.60  3.87 - 5.11 MIL/uL   Hemoglobin 15.5 (*) 12.0 - 15.0 g/dL   HCT 16.1  09.6 - 04.5 %   MCV 93.7  78.0 - 100.0 fL   MCH 33.7  26.0 - 34.0 pg   MCHC 36.0  30.0 - 36.0 g/dL   RDW 40.9  81.1 - 91.4 %   Platelets 210  150 - 400 K/uL   Neutrophils Relative % 50  43 - 77 %   Neutro Abs 5.4  1.7 - 7.7 K/uL   Lymphocytes Relative 41  12 - 46 %   Lymphs Abs 4.5 (*) 0.7 - 4.0 K/uL   Monocytes Relative 5  3 - 12 %   Monocytes Absolute 0.6  0.1 - 1.0 K/uL   Eosinophils Relative 3  0 - 5 %   Eosinophils Absolute 0.4  0.0 - 0.7 K/uL   Basophils Relative 0  0 - 1 %   Basophils Absolute 0.0  0.0 - 0.1 K/uL  BASIC METABOLIC PANEL      Result Value Range   Sodium 136  135 - 145 mEq/L   Potassium 3.6  3.5 - 5.1 mEq/L   Chloride 101  96 - 112 mEq/L   CO2 24  19 - 32 mEq/L   Glucose, Bld 170 (*) 70 - 99 mg/dL   BUN 18  6 - 23 mg/dL   Creatinine, Ser 7.82  0.50 - 1.10 mg/dL   Calcium 95.6 (*) 8.4 - 10.5 mg/dL   GFR calc non Af Amer >90  >90 mL/min   GFR calc Af Amer >90  >90 mL/min  TROPONIN I      Result Value Range   Troponin I <0.30  <0.30 ng/mL  D-DIMER, QUANTITATIVE      Result Value Range   D-Dimer, Quant 0.35  0.00 - 0.48 ug/mL-FEU   Dg Chest 2 View 02/02/2013   *RADIOLOGY REPORT*  Clinical Data: Chest pain.  History of  diabetes and atrial fibrillation.  Left chest pain radiating to the left shoulder and left arm.  History of uterine cancer.  CHEST - 2 VIEW  Comparison: 05/08/2012  Findings: Shallow inspiration.  Normal heart size and pulmonary vascularity.  Suggestion of a fine nodular airspace infiltration in the right lung base could represent early pneumonia or bronchitic changes.  No blunting of costophrenic angles.  No pneumothorax. Mediastinal contours appear intact.  Degenerative changes in the spine.  IMPRESSION: Fine nodular airspace infiltration in the right lung base possibly representing early pneumonia or bronchitic changes.   Original Report Authenticated By: Burman Nieves, M.D.     2255:  Pt's symptoms improving after ASA, SL ntg and morphine. Several risk factors for ACS, will observation admit. Dx and testing d/w pt.  Questions answered.  Verb understanding, agreeable to observation admit. T/C to [facility]. Orvan Falconer, case discussed, including:  HPI, pertinent PM/SHx, VS/PE, dx testing, ED course and treatment:  Agreeable to observation admit, requests to write temporary orders, obtain tele bed.      Laray Anger, DO 02/04/13 2131

## 2013-02-02 NOTE — ED Notes (Signed)
Patient complains of sudden onset of chest pain. Reports started under left shoulder then radiated into left chest and now through left arm.

## 2013-02-02 NOTE — H&P (Signed)
[facility] History and Physical  EVANEE [name]  ZOX:[id number]  DOB: 1956/04/02   DOA: 02/02/2013   PCP:   Harlow Asa, MD   Chief Complaint:  Chest discomfort for 1-1/2 hrs  HPI: [name] is a 57 y.o. female.   Obese Caucasian lady with history of hypertension diabetes and hyperlipidemia, negative nuclear stress test 7 years ago, and works as an Insurance underwriter here at [employer], and developed sudden onset of a left sub-scapula squeezing pain at about 7:30 PM last night when leaving work. It progressed to extend her left upper chest to come around under her left breast and involve the left arm and forearm and spread up to her neck. Is 9/10 intensity it is worse, associated with dose and shortness of breath. There was no lightheadedness nor dizziness, no nausea or  She got to the emergency room at around 9 PM and received sublingual nitroglycerin x3 which took away the pain.  Rewiew of Systems:   All systems negative except as marked bold or noted in the HPI;  Constitutional:    malaise, fever and chills. ;  Eyes:   eye pain, redness and discharge. ;  ENMT:   ear pain, hoarseness, nasal congestion, sinus pressure and sore throat. ;   Respiratory:   cough, hemoptysis, wheezing and stridor. ;  Gastrointestinal:  nausea, vomiting, diarrhea, constipation, abdominal pain, melena, blood in stool, hematemesis, jaundice and rectal bleeding. unusual weight loss..   Genitourinary:     dysuria, incontinence,flank pain and hematuria; Musculoskeletal:   back pain and neck pain.  swelling and trauma.;  Skin: .  pruritus, rash, abrasions, bruising and skin lesion.; ulcerations Neuro:    headache, land neck stiffness.  weakness, altered level of consciousness, altered mental status, extremity weakness, burning feet, involuntary movement, seizure and syncope.  Psych:   depression, insomnia, tearfulness, panic attacks, hallucinations, paranoia, suicidal or homicidal ideation    Past  Medical History  Diagnosis Date  . Hypertension   . Anxiety   . Depression   . Dysrhythmia     hx of A Fib  . Diabetes mellitus     niddm  . Fibromyalgia   . Cancer     uterine  . Neuropathy   . Hyperlipidemia   . Atrial fibrillation   . Degenerative joint disease     Past Surgical History  Procedure Laterality Date  . Cholecystectomy  2007  . Tubal ligation  1978  . Abdominal hysterectomy  2000  . Thyroid cyst excision  1996    removal of nodule  . Knee arthroscopy      left knee-multiple  . Knee arthroscopy  2000    right knee  . Replacement total knee bilateral      2006  . Carpal tunnel release  2003    bilateral   . Hernia repair  2007    incisional hernia repair x2  . 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 Bone biopsy  04/11/2011    Procedure: BONE BIOPSY;  Surgeon: Dallas Schimke;  Location: [facility];  Service: Orthopedics;  Laterality: Left;  Bone Biopsy Left Foot Third Toe   . Colonoscopy    . Toe amputation  November 2012    Medications:  HOME MEDS: Prior to Admission medications   Medication Sig Start Date End Date Taking? Authorizing Provider  diltiazem (CARDIZEM) 120 MG tablet Take 120 mg by mouth daily.      Historical Provider, MD  HYDROcodone-acetaminophen (NORCO/VICODIN) 5-325 MG per tablet Take 1 tablet by mouth every 6 (six) hours as needed. Pain    Historical Provider, MD  ibuprofen (ADVIL,MOTRIN) 200 MG tablet Take 800 mg by mouth every 6 (six) hours as needed. Pain    Historical Provider, MD  insulin glargine (LANTUS) 100 UNIT/ML injection Inject 30 Units into the skin daily.      Historical Provider, MD  insulin lispro (HUMALOG) 100 UNIT/ML injection Inject 10 Units into the skin 3 (three) times daily.     Historical Provider, MD  lisinopril (PRINIVIL,ZESTRIL) 5 MG tablet Take 1 tablet (5 mg  total) by mouth daily. Take 1/2 everyday 12/10/12   Babs Sciara, MD  Multiple Vitamin (MULTIVITAMIN) tablet Take 1 tablet by mouth daily.    Historical Provider, MD  PARoxetine (PAXIL) 20 MG tablet Take 4 tablets (80 mg total) by mouth every morning. 12/10/12   Babs Sciara, MD  pravastatin (PRAVACHOL) 20 MG tablet Take 1 tablet (20 mg total) by mouth daily. 12/10/12   Babs Sciara, MD  Sodium & Potassium Bicarbonate (ALKA-AID) 360-180 MG TABS Take 2 tablets by mouth every 4 (four) hours as needed.    Historical Provider, MD  traMADol (ULTRAM) 50 MG tablet Take 50 mg by mouth every 6 (six) hours as needed for pain.    Historical Provider, MD     Allergies:  Allergies  Allergen Reactions  . Chantix [Varenicline Tartrate] Other (See Comments)    psychotic episodes  . Codeine Nausea Only    Social History:   reports that she has been smoking Cigarettes.  She has a 10 pack-year smoking history. She does not have any smokeless tobacco history on file. She reports that she does not drink alcohol or use illicit drugs.  Family History: Family History  Problem Relation Age of Onset  . Anesthesia problems Neg Hx   . Hypotension Neg Hx   . Malignant hyperthermia Neg Hx   . Pseudochol deficiency Neg Hx      Physical Exam: Filed Vitals:   02/02/13 2117 02/02/13 2248  BP: 134/55 141/75  Pulse: 75 70  Temp: 97.8 F (36.6 C)   TempSrc: Oral   Resp: 13 20  Height: 5\' 10"  (1.778 m)   Weight: 129.275 kg (285 lb)   SpO2: 95% 93%   Blood pressure 141/75, pulse 70, temperature 97.8 F (36.6 C), temperature source Oral, resp. rate 20, height 5\' 10"  (1.778 m), weight 129.275 kg (285 lb), SpO2 93.00%. Body mass index is 40.89 kg/(m^2).   GEN:  Pleasant and somewhat anxious middle-aged Caucasian lady lying bed in no acute distress; cooperative with exam PSYCH:  alert and oriented x4;  affect is appropriate. HEENT: Mucous membranes pink and anicteric; PERRLA; EOM intact; no cervical  lymphadenopathy nor thyromegaly or carotid bruit; no JVD; Breasts:: Not examined CHEST WALL: No tenderness CHEST: Normal respiration, clear to auscultation bilaterally HEART: Regular rate and rhythm; no murmurs rubs or gallops BACK:  no CVA tenderness ABDOMEN: Obese, soft non-tender; no masses, no organomegaly, normal abdominal bowel sounds; large pannus; no intertriginous candida. Rectal Exam: Not done EXTREMITIES: age-appropriate arthropathy of the hands and knees; for swelling of her right foot; no edema; no ulcerations. Genitalia: not examined PULSES: 2+ and symmetric SKIN: Normal hydration no rash or ulceration CNS: Cranial nerves 2-12 grossly intact no focal lateralizing neurologic deficit   Labs  on Admission:  Basic Metabolic Panel:  Recent Labs Lab 02/02/13 2135  NA 136  K 3.6  CL 101  CO2 24  GLUCOSE 170*  BUN 18  CREATININE 0.67  CALCIUM 11.0*   Liver Function Tests: No results found for this basename: AST, ALT, ALKPHOS, BILITOT, PROT, ALBUMIN,  in the last 168 hours No results found for this basename: LIPASE, AMYLASE,  in the last 168 hours No results found for this basename: AMMONIA,  in the last 168 hours CBC:  Recent Labs Lab 02/02/13 2135  WBC 10.9*  NEUTROABS 5.4  HGB 15.5*  HCT 43.1  MCV 93.7  PLT 210   Cardiac Enzymes:  Recent Labs Lab 02/02/13 2135  TROPONINI <0.30   BNP: No components found with this basename: POCBNP,  D-dimer: No components found with this basename: D-DIMER,  CBG: No results found for this basename: GLUCAP,  in the last 168 hours  Radiological Exams on Admission: Dg Chest 2 View  02/02/2013   *RADIOLOGY REPORT*  Clinical Data: Chest pain.  History of diabetes and atrial fibrillation.  Left chest pain radiating to the left shoulder and left arm.  History of uterine cancer.  CHEST - 2 VIEW  Comparison: 05/08/2012  Findings: Shallow inspiration.  Normal heart size and pulmonary vascularity.  Suggestion of a fine nodular  airspace infiltration in the right lung base could represent early pneumonia or bronchitic changes.  No blunting of costophrenic angles.  No pneumothorax. Mediastinal contours appear intact.  Degenerative changes in the spine.  IMPRESSION: Fine nodular airspace infiltration in the right lung base possibly representing early pneumonia or bronchitic changes.   Original Report Authenticated By: Burman Nieves, M.D.    EKG: Independently reviewed. Normal sinus rhythm no ST segment changes   Assessment/Plan  Active Problems:   Type 2 diabetes mellitus not at goal   Essential hypertension, benign   Hyperlipemia   Chest pain, relieved by nitroglycerin   PLAN: Admit this lady for serial cardiac enzymes cardiac monitoring; In view of her multiple risk factors and the prompt relief with nitroglycerin cardiology evaluation recommend Continue management of chronic medical condition  Other plans as per orders.  Code Status: Full code Family Communication: No family at bedside Disposition Plan: Home in a day or 2   Shanaye Rief Nocturnist [facility] Pager [phone]   02/02/2013, 10:57 PM

## 2013-02-03 ENCOUNTER — Other Ambulatory Visit: Payer: Self-pay | Admitting: *Deleted

## 2013-02-03 ENCOUNTER — Encounter (HOSPITAL_COMMUNITY): Payer: Self-pay

## 2013-02-03 DIAGNOSIS — R079 Chest pain, unspecified: Secondary | ICD-10-CM

## 2013-02-03 DIAGNOSIS — J189 Pneumonia, unspecified organism: Secondary | ICD-10-CM

## 2013-02-03 DIAGNOSIS — I517 Cardiomegaly: Secondary | ICD-10-CM

## 2013-02-03 LAB — GLUCOSE, CAPILLARY
Glucose-Capillary: 157 mg/dL — ABNORMAL HIGH (ref 70–99)
Glucose-Capillary: 161 mg/dL — ABNORMAL HIGH (ref 70–99)
Glucose-Capillary: 111 mg/dL — ABNORMAL HIGH (ref 70–99)

## 2013-02-03 LAB — BASIC METABOLIC PANEL
Chloride: 104 mEq/L (ref 96–112)
Creatinine, Ser: 0.58 mg/dL (ref 0.50–1.10)
GFR calc Af Amer: >90 mL/min (ref >90)
Potassium: 3.8 mEq/L (ref 3.5–5.1)
Sodium: 138 mEq/L (ref 135–145)

## 2013-02-03 LAB — TSH: TSH: 1.722 u[IU]/mL (ref 0.350–4.500)

## 2013-02-03 LAB — CBC
HCT: 41.1 % (ref 36.0–46.0)
RBC: 4.35 MIL/uL (ref 3.87–5.11)
RDW: 12.2 % (ref 11.5–15.5)
WBC: 9.8 10*3/uL (ref 4.0–10.5)

## 2013-02-03 LAB — TROPONIN I: Troponin I: <0.3 ng/mL (ref <0.30)

## 2013-02-03 LAB — HEMOGLOBIN A1C: Mean Plasma Glucose: 154 mg/dL — ABNORMAL HIGH (ref <117)

## 2013-02-03 MED ORDER — PAROXETINE HCL 20 MG PO TABS
80.0000 mg | ORAL_TABLET | Freq: Every day | ORAL | Status: DC
  Administered 2013-02-03: 80 mg via ORAL
  Filled 2013-02-03: qty 4

## 2013-02-03 MED ORDER — PNEUMOCOCCAL VAC POLYVALENT 25 MCG/0.5ML IJ INJ
0.5000 mL | INJECTION | INTRAMUSCULAR | Status: DC
  Filled 2013-02-03: qty 0.5

## 2013-02-03 MED ORDER — POTASSIUM CHLORIDE IN NACL 20-0.9 MEQ/L-% IV SOLN
INTRAVENOUS | Status: DC
  Administered 2013-02-03: 50 mL/h via INTRAVENOUS

## 2013-02-03 MED ORDER — BISACODYL 5 MG PO TBEC: 5.0000 mg | DELAYED_RELEASE_TABLET | Freq: Every day | ORAL | Status: DC | PRN

## 2013-02-03 MED ORDER — FLEET ENEMA 7-19 GM/118ML RE ENEM: 1.0000 | ENEMA | Freq: Once | RECTAL | Status: DC | PRN

## 2013-02-03 MED ORDER — INSULIN ASPART 100 UNIT/ML ~~LOC~~ SOLN
0.0000 [IU] | Freq: Three times a day (TID) | SUBCUTANEOUS | Status: DC
  Administered 2013-02-03: 2 [IU] via SUBCUTANEOUS
  Administered 2013-02-03: 3 [IU] via SUBCUTANEOUS

## 2013-02-03 MED ORDER — TRAZODONE HCL 50 MG PO TABS: 50.0000 mg | ORAL_TABLET | Freq: Every evening | ORAL | Status: DC | PRN

## 2013-02-03 MED ORDER — IBUPROFEN 800 MG PO TABS: 800.0000 mg | ORAL_TABLET | Freq: Four times a day (QID) | ORAL | Status: DC | PRN

## 2013-02-03 MED ORDER — ACETAMINOPHEN 325 MG PO TABS: 650.0000 mg | ORAL_TABLET | ORAL | Status: DC | PRN

## 2013-02-03 MED ORDER — NITROGLYCERIN 0.4 MG SL SUBL: 0.4000 mg | SUBLINGUAL_TABLET | SUBLINGUAL | Status: DC | PRN

## 2013-02-03 MED ORDER — ASPIRIN 81 MG PO CHEW: 81.0000 mg | CHEWABLE_TABLET | Freq: Every day | ORAL | Status: AC

## 2013-02-03 MED ORDER — POLYETHYLENE GLYCOL 3350 17 G PO PACK: 17.0000 g | PACK | Freq: Every day | ORAL | Status: DC | PRN

## 2013-02-03 MED ORDER — TRAMADOL HCL 50 MG PO TABS: 50.0000 mg | ORAL_TABLET | Freq: Four times a day (QID) | ORAL | Status: DC | PRN

## 2013-02-03 MED ORDER — SIMVASTATIN 20 MG PO TABS
10.0000 mg | ORAL_TABLET | Freq: Every day | ORAL | Status: DC
  Administered 2013-02-03: 10 mg via ORAL
  Filled 2013-02-03: qty 1

## 2013-02-03 MED ORDER — INSULIN ASPART 100 UNIT/ML ~~LOC~~ SOLN: 0.0000 [IU] | Freq: Every day | SUBCUTANEOUS | Status: DC

## 2013-02-03 MED ORDER — ENOXAPARIN SODIUM 40 MG/0.4ML ~~LOC~~ SOLN
40.0000 mg | SUBCUTANEOUS | Status: DC
  Administered 2013-02-03: 40 mg via SUBCUTANEOUS
  Filled 2013-02-03: qty 0.4

## 2013-02-03 MED ORDER — INSULIN GLARGINE 100 UNIT/ML ~~LOC~~ SOLN
30.0000 [IU] | Freq: Every day | SUBCUTANEOUS | Status: DC
  Administered 2013-02-03: 30 [IU] via SUBCUTANEOUS
  Filled 2013-02-03 (×2): qty 0.3

## 2013-02-03 MED ORDER — SODIUM CHLORIDE 0.9 % IJ SOLN
3.0000 mL | Freq: Two times a day (BID) | INTRAMUSCULAR | Status: DC
  Administered 2013-02-03: 3 mL via INTRAVENOUS

## 2013-02-03 MED ORDER — ASPIRIN 81 MG PO CHEW
81.0000 mg | CHEWABLE_TABLET | Freq: Every day | ORAL | Status: DC
  Administered 2013-02-03: 81 mg via ORAL
  Filled 2013-02-03: qty 1

## 2013-02-03 MED ORDER — DILTIAZEM HCL ER COATED BEADS 120 MG PO CP24
120.0000 mg | ORAL_CAPSULE | Freq: Every day | ORAL | Status: DC
  Administered 2013-02-03: 120 mg via ORAL
  Filled 2013-02-03: qty 1

## 2013-02-03 MED ORDER — ALUM & MAG HYDROXIDE-SIMETH 200-200-20 MG/5ML PO SUSP: 30.0000 mL | Freq: Four times a day (QID) | ORAL | Status: DC | PRN

## 2013-02-03 MED ORDER — LISINOPRIL 5 MG PO TABS
5.0000 mg | ORAL_TABLET | Freq: Every day | ORAL | Status: DC
  Administered 2013-02-03: 5 mg via ORAL
  Filled 2013-02-03: qty 1

## 2013-02-03 MED ORDER — ONDANSETRON HCL 4 MG/2ML IJ SOLN: 4.0000 mg | INTRAMUSCULAR | Status: DC | PRN

## 2013-02-03 MED ORDER — IBUPROFEN 800 MG PO TABS: 800.0000 mg | ORAL_TABLET | Freq: Every day | ORAL | Status: DC

## 2013-02-03 NOTE — Progress Notes (Signed)
UR Chart Review Completed  

## 2013-02-03 NOTE — Progress Notes (Signed)
Pt to be discharged home per MD order. All discharge instructions gone over with pt and pts spouse using teach back method. Pt discharged home with spouse and family members. Pt ambulated with RN to main entrance.

## 2013-02-03 NOTE — Progress Notes (Addendum)
[facility] PROGRESS NOTE  [name] NWG:[id number] DOB: 1956-04-16 DOA: 02/02/2013 PCP: Harlow Asa, MD  Assessment/Plan: 1. Chest pain: With typical features. No recurrence at this point. Continue serial cardiac enzymes. Cardiology consultation for further recommendations. Continue aspirin, Zocor. 2. Diabetes mellitus type 2: Sliding scale insulin. Continue Lantus. 3. Hyperlipidemia: Check fasting lipid panel. 4. Hypertension: Stable. Continue Cardizem. 5. Cigarette smoker: Recommend cessation. 6. Possible chronic bronchitis on chest x-ray: No signs or symptoms to suggest acute infection.   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 Initial evaluation was unremarkable with negative troponin and normal EKG and patient was admitted for further evaluation of chest pain.  Consultants:  [place] [facility]  Procedures:    HPI/Subjective: Feels better today. She has had no recurrent chest pain since admission. No shortness of breath. Had a nuclear stress test 7 years ago which was unremarkable. She is not had any cardiac symptoms since. She does have a history of atrial fibrillation but there is no history of coronary disease. She has a chronic smoker's cough without change. She has no right-sided chest pain.  Objective: Filed Vitals:   02/03/13  0545 02/03/13 0600 02/03/13 0615 02/03/13 0630  BP:  161/75    Pulse: 59 78 75 53  Temp:      TempSrc:      Resp: 24 21 24 15   Height:      Weight:      SpO2: 96% 98% 96% 94%    Intake/Output Summary (Last 24 hours) at 02/03/13 0857 Last data filed at 02/03/13 0600  Gross per 24 hour  Intake 201.67 ml  Output      0 ml  Net 201.67 ml     Filed Weights   02/02/13 2117 02/02/13 2345  Weight: 129.275 kg (285 lb) 128 kg (282 lb 3 oz)    Exam:   Afebrile, vital signs stable.  General: Appears calm and comfortable. Speech fluent and clear.  Cardiovascular: Regular rate and rhythm, some bradycardia. No murmur, rub, gallop. No lower extremity edema.  Respiratory: Clear to auscultation bilaterally. No wheezes, rales, rhonchi. Normal respiratory effort.  Abdomen: Soft, nontender, nondistended. Ventral hernia.  Skin: Appears grossly unremarkable.  Psychiatric: Grossly normal mood and affect. Speech fluent and appropriate.  Data Reviewed:  Capillary blood sugars stable  Basic metabolic panel unremarkable.  Troponins negative thus far.  D-dimer negative.  CBC unremarkable  Chest x-ray with fine nodular airspace infiltration suggestive of bronchitic changes.  EKG sinus rhythm. No acute changes.  Scheduled Meds: . aspirin      . aspirin  81 mg Oral Daily  . diltiazem  120 mg Oral Daily  . enoxaparin (LOVENOX) injection  40 mg Subcutaneous Q24H  . insulin aspart  0-15 Units Subcutaneous TID WC  . insulin aspart  0-5 Units Subcutaneous QHS  . insulin glargine  30 Units Subcutaneous Daily  . lisinopril  5 mg Oral Daily  . PARoxetine  80 mg Oral  QAC breakfast  . [START ON 02/04/2013] pneumococcal 23 valent vaccine  0.5 mL Intramuscular Tomorrow-1000  . simvastatin  10 mg Oral q1800  . sodium chloride  3 mL Intravenous Q12H   Continuous Infusions: . 0.9 % NaCl with KCl 20 mEq / L 50 mL/hr (02/03/13 0158)    Principal Problem:   Chest pain Active Problems:    Type 2 diabetes mellitus not at goal   Essential hypertension, benign   Hyperlipemia   Time spent 20 minutes

## 2013-02-03 NOTE — Progress Notes (Signed)
*  PRELIMINARY RESULTS* Echocardiogram 2D Echocardiogram has been performed.  Conrad Hondo 02/03/2013, 2:22 PM

## 2013-02-03 NOTE — Consult Note (Signed)
CARDIOLOGY CONSULT NOTE  Patient ID: [name] MRN: [id number] DOB/AGE: [birth year] 57 y.o.  Admit date: 02/02/2013 Referring Physician: [facility] Primary Physician Harlow Asa, MD Consulting Cardiologist: Dr. 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 She began to experience this while walking to her car. Pain radiated into her neck. When she arrived home, she laid down but had no relief. She noticed the pain increased and moved into her left chest and began to have pain in her left arm. This became worrisome to her, and she came to ER. She states she had associated dyspnea but no diaphoresis, dizziness, or nausea. Pain lasted 4 hours until relieved in ER. She has never experienced pain like this in the past.    On arrival to ER, BP 134. 55, HR 75, and O2 sat of 95%. She denied lifting, move or pulling heavy patients or objects prior to this episode. CXR reported fine nodular airspace infiltration in the right lung base possibly representing early pneumonia or bronchitic changes. EKG was normal without evidence of ACS. Troponin was negative X 3. D-dimer 0.35. WBC's slightly elevated at 10.9 but have now normalized.      She was treated with NTG X 3 and ASA, and had complete relief of chest pain . She had nuclear stress test in 2007 which was found to be negative for ischemia. EF of 53% at that time.   Review of systems complete and found to be negative unless listed above   Past Medical History  Diagnosis Date  . Hypertension   . Anxiety   . Depression   . Dysrhythmia     hx of A Fib  . Diabetes mellitus     niddm  . Fibromyalgia   . Cancer     uterine  . Neuropathy   . Hyperlipidemia    . Atrial fibrillation   . Degenerative joint disease   . History of stress test 2007    NM stress test negative    Family History  Problem Relation Age of Onset  . Anesthesia problems Neg Hx   . Hypotension Neg Hx   . Malignant hyperthermia Neg Hx   . Pseudochol deficiency Neg Hx   . Heart attack Brother     about age 57    History   Social History  . Marital Status: Married    Spouse Name: N/A    Number of Children: N/A  . Years of Education: N/A   Occupational History  . Not on file.   Social History Main Topics  . Smoking status: Current Every Day Smoker -- 0.25 packs/day for 40 years    Types: Cigarettes  . Smokeless tobacco: Not on file  . Alcohol Use: No  . Drug Use: No  . Sexual Activity: Yes    Birth Control/ Protection: Surgical   Other Topics Concern  . Not on file   Social History Narrative  . No narrative on file    Past Surgical History  Procedure Laterality Date  . Cholecystectomy  2007  . Tubal ligation  1978  . Abdominal hysterectomy  2000  . Thyroid cyst excision  1996    removal of nodule  . Knee arthroscopy  left knee-multiple  . Knee arthroscopy  2000    right knee  . Replacement total knee bilateral      2006  . Carpal tunnel release  2003    bilateral   . Hernia repair  2007    incisional hernia repair x2  . 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 Bone biopsy  04/11/2011    Procedure: BONE BIOPSY;  Surgeon: Dallas Schimke;  Location: [facility];  Service: Orthopedics;  Laterality: Left;  Bone Biopsy Left Foot Third Toe   . Colonoscopy    . Toe amputation  November 2012     Prescriptions prior to admission  Medication Sig Dispense Refill  . diltiazem (CARDIZEM) 120 MG tablet Take 120 mg by mouth daily.        Marland Kitchen HYDROcodone-acetaminophen (NORCO/VICODIN) 5-325 MG per tablet  Take 1 tablet by mouth every 6 (six) hours as needed. Pain      . ibuprofen (ADVIL,MOTRIN) 200 MG tablet Take 800 mg by mouth every 6 (six) hours as needed. Pain      . insulin glargine (LANTUS) 100 UNIT/ML injection Inject 30 Units into the skin daily.        . insulin lispro (HUMALOG) 100 UNIT/ML injection Inject 10 Units into the skin 3 (three) times daily.       Marland Kitchen lisinopril (PRINIVIL,ZESTRIL) 5 MG tablet Take 1 tablet (5 mg total) by mouth daily. Take 1/2 everyday  90 tablet  2  . Multiple Vitamin (MULTIVITAMIN) tablet Take 1 tablet by mouth daily.      Marland Kitchen PARoxetine (PAXIL) 20 MG tablet Take 4 tablets (80 mg total) by mouth every morning.  360 tablet  2  . pravastatin (PRAVACHOL) 20 MG tablet Take 1 tablet (20 mg total) by mouth daily.  90 tablet  2  . Sodium & Potassium Bicarbonate (ALKA-AID) 360-180 MG TABS Take 2 tablets by mouth every 4 (four) hours as needed.      . traMADol (ULTRAM) 50 MG tablet Take 50 mg by mouth every 6 (six) hours as needed for pain.        Physical Exam: Blood pressure 161/75, pulse 53, temperature 97.8 F (36.6 C), temperature source Oral, resp. rate 15, height 5\' 10"  (1.778 m), weight 282 lb 3 oz (128 kg), SpO2 94.00%.   General: Obese, in no acute distress Head: Wears glasses. No xanthomas. Normal cephalic and atramatic  Lungs: Clear bilaterally to auscultation without wheezes or rhonchi. Heart: RRR without MRG.  Pulses are 2+ & equal. No carotid bruit. No JVD.  Abdomen: Bowel sounds are positive, abdomen soft and non-tender. Msk:  Back normal. Normal strength and tone for age. Extremities: No clubbing, cyanosis or edema.  DP +1 Neuro: Alert and oriented X 3. Psych:  Good affect, responds appropriately   Labs:   Lab Results  Component Value Date   WBC 9.8 02/03/2013   HGB 14.3 02/03/2013   HCT 41.1 02/03/2013   MCV 94.5 02/03/2013   PLT 197 02/03/2013    Recent Labs Lab 02/03/13 0359  NA 138  K 3.8  CL 104  CO2 27  BUN 17  CREATININE 0.58   CALCIUM 10.3  GLUCOSE 164*   Lab Results  Component Value Date   TROPONINI <0.30 02/03/2013     Nuclear Study 2007 IMPRESSION: Negative stress nuclear study revealing no significant  stress - induced  EKG abnormalities, borderline left ventricular size and normal left  ventricular systolic function. No electrocardiographic evidence for  ischemia. By scintigraphic imaging, there was neither ischemia nor  infarction.    Radiology: Dg Chest 2 View  02/02/2013   *RADIOLOGY REPORT*  Clinical Data: Chest pain.  History of diabetes and atrial fibrillation.  Left chest pain radiating to the left shoulder and left arm.  History of uterine cancer.  CHEST - 2 VIEW  Comparison: 05/08/2012  Findings: Shallow inspiration.  Normal heart size and pulmonary vascularity.  Suggestion of a fine nodular airspace infiltration in the right lung base could represent early pneumonia or bronchitic changes.  No blunting of costophrenic angles.  No pneumothorax. Mediastinal contours appear intact.  Degenerative changes in the spine.  IMPRESSION: Fine nodular airspace infiltration in the right lung base possibly representing early pneumonia or bronchitic changes.   Original Report Authenticated By: Burman Nieves, M.D.    EKG: NSR rate of 74 bpm.      ASSESSMENT AND PLAN:    1.Atypical chest pain: Troponin and EKG are negative. She has CVRFs to include FH of premature CAD, hypertension, diabetes, obesity, hypercholesterolemia, and smoking. She would likely benefit from repeat stress test for objective evaluation of ischemia. This can be done as OP as she is stable presently.  2. Hypertension: Blood pressure is well controlled presently. Continue lisinopril and diltiazem as directed.  3. Paroxysmal Atrial fibrillation: Diagnosed in 2000, no anticoagulation, remains on ASA and diltiazem and in sinus rhythm now.  4. Diabetes: Remains on insulin injections with good control.   5. Hypercholesterolemia: Now on  pravastatin po at home.Recommend weight loss and increased exercise.   6. Possible pneumonia: Noted per CXR. Defer to [facility] for treatment. Question of potentially related to presenting symptoms.   Signed: Bettey Mare. Lyman Bishop NP [facility] 02/03/2013, 10:03 AM Co-Sign MD  Attending note:  Patient seen and examined. Modified above note by Ms. Lawrence NP. Ms. Mckiver presents after a fairly prolonged episode of chest pain, ECG and cardiac markers normal. Her chest x-ray suggests possible early pneumonia, she describes a "smoker's cough" but no fevers or chills. She does have cardiac risk factors as outlined above, no palpitations or obvious recent PAF. Last ischemic evaluation was in 2007. She denies any chest pain today. Would expect that she could undergo an outpatient Lexiscan Myoview for objective ischemic testing, and then have office followup to review this. We can coordinate this with the patient is ready for discharge. Otherwise further management by primary team.  Jonelle Sidle, M.D., F.A.C.C.

## 2013-02-03 NOTE — Discharge Summary (Signed)
Physician Discharge Summary  [name] ZOX:[id number] DOB: [birth year] DOA: 02/02/2013  PCP: Harlow Asa, MD  Admit date: 02/02/2013 Discharge date: 02/03/2013  Recommendations for Outpatient Follow-up:  1. Chest pain. Outpatient nuclear stress test planned. 2. Recommend smoking cessation 3. Echocardiogram results pending  Follow-up Information   Follow up with [facility] On 02/11/2013. (Stress Lexiscan NM study  8:30am)       Follow up with Harlow Asa, MD In 2 weeks.   Specialty:  Family Medicine   Contact information:   [address] [place] [address] [phone]      Discharge Diagnoses:  1. Chest pain with typical features 2. Diabetes mellitus type 2 3. Hyperlipidemia 4. Hypertension 5. 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 Initial evaluation was unremarkable with negative troponin and normal EKG and patient was admitted for further evaluation of chest pain.  Hospital Course:  Mrs. Cisse is observed overnight. She had no recurrent chest pain or shortness of breath. Telemetry was unrevealing. Cardiac enzymes were negative. She is seen in consultation with cardiology. Given her negative evaluation, no further inpatient evaluation was recommended. She has been set up for outpatient nuclear stress testing. She is now stable for discharge.  1. Chest pain: With typical features. No recurrence at this point. Continue serial cardiac enzymes. Cardiology consultation for  further recommendations. Continue aspirin, Zocor. 2. Diabetes mellitus type 2: Sliding scale insulin. Continue Lantus. 3. Hyperlipidemia: Check fasting lipid panel. 4. Hypertension: Stable. Continue Cardizem. 5. Cigarette smoker: Recommend cessation. 6. Possible chronic bronchitis on chest x-ray: No signs or symptoms to suggest acute infection.  Consultants:   [place] [facility] Procedures:  Echocardiogram pending  Discharge Instructions  Discharge Orders   Future Appointments Provider Department Dept Phone   02/11/2013 9:45 AM [facility] In[facility] 1 [place] [facility] [phone]   NPO after midnight   02/11/2013 10:30 AM [facility] 1 [place] N[facility]LE[facility]IC[facility] [phone]   02/11/2013 11:30 AM [facility] 1 [place] [facility] [phone]   NPO after midnight   02/11/2013 11:45 AM [facility] Stress Lab [place] [facility] [phone]   02/11/2013 12:30 PM [facility] 1  NUCLEUR MEDICINE [phone]   02/18/2013 1:40 PM Jodelle Gross, NP [facility] [place] [phone]   Future Orders Complete By Expires   Activity as tolerated - No restrictions  As directed    Diet - low sodium heart healthy  As directed    Diet Carb Modified  As directed    Discharge instructions  As directed    Comments:     Call physician or seek immediate medical attention for chest pain, shortness of breath or worsening of condition.       Medication List         ALKA-AID 360-180 MG Tabs  Generic drug:  Sodium & Potassium Bicarbonate  Take 2 tablets by mouth every 4 (four) hours as needed. gas     aspirin 81 MG chewable tablet  Chew 1 tablet (81 mg total) by mouth daily.     diltiazem 120  MG tablet  Commonly known as:  CARDIZEM  Take 120 mg by mouth daily.     HYDROcodone-acetaminophen 5-325 MG per tablet  Commonly known as:  NORCO/VICODIN  Take 1 tablet by mouth every 6 (six) hours as needed. Pain     ibuprofen 800 MG tablet  Commonly known as:   ADVIL,MOTRIN  Take 1 tablet (800 mg total) by mouth daily.     insulin glargine 100 UNIT/ML injection  Commonly known as:  LANTUS  Inject 30 Units into the skin at bedtime.     insulin lispro 100 UNIT/ML injection  Commonly known as:  HUMALOG  Inject 10 Units into the skin 3 (three) times daily.     lisinopril 5 MG tablet  Commonly known as:  PRINIVIL,ZESTRIL  Take 2.5 mg by mouth daily.     multivitamin tablet  Take 1 tablet by mouth daily.     PARoxetine 20 MG tablet  Commonly known as:  PAXIL  Take 80 mg by mouth daily.     pravastatin 20 MG tablet  Commonly known as:  PRAVACHOL  Take 1 tablet (20 mg total) by mouth daily.     traMADol 50 MG tablet  Commonly known as:  ULTRAM  Take 50 mg by mouth every 6 (six) hours as needed for pain.       Allergies  Allergen Reactions  . Chantix [Varenicline Tartrate] Other (See Comments)    psychotic episodes  . Codeine Nausea Only    The results of significant diagnostics from this hospitalization (including imaging, microbiology, ancillary and laboratory) are listed below for reference.    Significant Diagnostic Studies: Dg Chest 2 View  02/02/2013   *RADIOLOGY REPORT*  Clinical Data: Chest pain.  History of diabetes and atrial fibrillation.  Left chest pain radiating to the left shoulder and left arm.  History of uterine cancer.  CHEST - 2 VIEW  Comparison: 05/08/2012  Findings: Shallow inspiration.  Normal heart size and pulmonary vascularity.  Suggestion of a fine nodular airspace infiltration in the right lung base could represent early pneumonia or bronchitic changes.  No blunting of costophrenic angles.  No pneumothorax. Mediastinal contours appear intact.  Degenerative changes in the spine.  IMPRESSION: Fine nodular airspace infiltration in the right lung base possibly representing early pneumonia or bronchitic changes.   Original Report Authenticated By: Burman Nieves, M.D.   Microbiology: Recent Results (from the past  240 hour(s))  MRSA PCR SCREENING     Status: None   Collection Time    02/03/13  3:00 AM      Result Value Range Status   MRSA by PCR NEGATIVE  NEGATIVE Final   Comment:            The GeneXpert MRSA Assay (FDA     approved for NASAL specimens     only), is one component of a     comprehensive MRSA colonization     surveillance program. It is not     intended to diagnose MRSA     infection nor to guide or     monitor treatment for     MRSA infections.     Labs: Basic Metabolic Panel:  Recent Labs Lab 02/02/13 2135 02/03/13 0359  NA 136 138  K 3.6 3.8  CL 101 104  CO2 24 27  GLUCOSE 170* 164*  BUN 18 17  CREATININE 0.67 0.58  CALCIUM 11.0* 10.3   CBC:  Recent Labs Lab 02/02/13 2135 02/03/13 0359  WBC 10.9*  9.8  NEUTROABS 5.4  --   HGB 15.5* 14.3  HCT 43.1 41.1  MCV 93.7 94.5  PLT 210 197   Cardiac Enzymes:  Recent Labs Lab 02/02/13 2135 02/03/13 0358 02/03/13 0957 02/03/13 1548  TROPONINI <0.30 <0.30 <0.30 <0.30   CBG:  Recent Labs Lab 02/03/13 0158 02/03/13 0748 02/03/13 1115 02/03/13 1634  GLUCAP 157* 139* 161* 111*    Principal Problem:   Chest pain Active Problems:   Type 2 diabetes mellitus not at goal   Essential hypertension, benign   Hyperlipemia   Pneumonia   Time coordinating discharge: 20 minutes  Signed:  Brendia Sacks, MD [facility] 02/03/2013, 5:15 PM

## 2013-02-03 NOTE — Progress Notes (Signed)
Dr Irene Limbo paged and made aware that Troponin results have come back.

## 2013-02-11 ENCOUNTER — Encounter (HOSPITAL_COMMUNITY): Payer: 59

## 2013-02-12 ENCOUNTER — Encounter (HOSPITAL_COMMUNITY): Payer: Self-pay

## 2013-02-12 ENCOUNTER — Encounter (HOSPITAL_COMMUNITY)
Admission: RE | Admit: 2013-02-12 | Discharge: 2013-02-12 | Disposition: A | Discharge status: Home / Self Care | Payer: 59 | Source: Ambulatory Visit | Attending: Adult Health | Admitting: Adult Health

## 2013-02-12 DIAGNOSIS — R0602 Shortness of breath: Secondary | ICD-10-CM | POA: Insufficient documentation

## 2013-02-12 DIAGNOSIS — I4891 Unspecified atrial fibrillation: Secondary | ICD-10-CM | POA: Insufficient documentation

## 2013-02-12 DIAGNOSIS — R079 Chest pain, unspecified: Secondary | ICD-10-CM

## 2013-02-12 DIAGNOSIS — R0789 Other chest pain: Secondary | ICD-10-CM | POA: Insufficient documentation

## 2013-02-12 DIAGNOSIS — F17200 Nicotine dependence, unspecified, uncomplicated: Secondary | ICD-10-CM | POA: Insufficient documentation

## 2013-02-12 DIAGNOSIS — E785 Hyperlipidemia, unspecified: Secondary | ICD-10-CM | POA: Insufficient documentation

## 2013-02-12 DIAGNOSIS — I10 Essential (primary) hypertension: Secondary | ICD-10-CM | POA: Insufficient documentation

## 2013-02-12 DIAGNOSIS — I498 Other specified cardiac arrhythmias: Secondary | ICD-10-CM | POA: Insufficient documentation

## 2013-02-12 DIAGNOSIS — E119 Type 2 diabetes mellitus without complications: Secondary | ICD-10-CM | POA: Insufficient documentation

## 2013-02-12 MED ORDER — SODIUM CHLORIDE 0.9 % IJ SOLN
INTRAMUSCULAR | Status: AC
  Administered 2013-02-12: 10 mL via INTRAVENOUS
  Filled 2013-02-12: qty 10

## 2013-02-12 MED ORDER — REGADENOSON 0.4 MG/5ML IV SOLN
INTRAVENOUS | Status: AC
  Administered 2013-02-12: 0.4 mg via INTRAVENOUS
  Filled 2013-02-12: qty 5

## 2013-02-12 MED ORDER — TECHNETIUM TC 99M SESTAMIBI - CARDIOLITE
10.0000 | Freq: Once | INTRAVENOUS | Status: AC | PRN
  Administered 2013-02-12: 09:00:00 10 via INTRAVENOUS

## 2013-02-12 MED ORDER — TECHNETIUM TC 99M SESTAMIBI - CARDIOLITE
30.0000 | Freq: Once | INTRAVENOUS | Status: AC | PRN
  Administered 2013-02-12: 30 via INTRAVENOUS

## 2013-02-12 NOTE — Progress Notes (Signed)
Stress Lab Nurses Notes - [name]  [name] 02/12/2013 Reason for doing test: Chest Pain Type of test: Marlane Hatcher Nurse performing test: Parke Poisson, RN Nuclear Medicine Tech: [name] Echo Tech: Not Applicable MD performing test: Purvis Sheffield / Lorin Picket NP Family MD: Lilyan Punt MD Test explained and consent signed: yes IV started: 22g jelco, Saline lock flushed, No redness or edema and Saline lock started in radiology Symptoms: SOB Treatment/Intervention: None Reason test stopped: protocol completed After recovery IV was: Discontinued via X-ray tech and No redness or edema Patient to return to Nuc. Med at : 11:45 Patient discharged: Home Patient's Condition upon discharge was: stable Comments: During test BP 109/50 & HR 70.  Recovery BP 119/50 & HR 56.  Symptoms resolved in recovery. Erskine Speed T

## 2013-02-18 ENCOUNTER — Ambulatory Visit: Payer: 59 | Admitting: Adult Health

## 2013-02-21 ENCOUNTER — Encounter: Payer: Self-pay | Admitting: *Deleted

## 2013-02-24 ENCOUNTER — Ambulatory Visit (INDEPENDENT_AMBULATORY_CARE_PROVIDER_SITE_OTHER): Payer: 59 | Admitting: Family Medicine

## 2013-02-24 ENCOUNTER — Encounter: Payer: Self-pay | Admitting: Family Medicine

## 2013-02-24 VITALS — BP 130/86 | Ht 70.0 in | Wt 288.0 lb

## 2013-02-24 DIAGNOSIS — R079 Chest pain, unspecified: Secondary | ICD-10-CM

## 2013-02-24 DIAGNOSIS — I6522 Occlusion and stenosis of left carotid artery: Secondary | ICD-10-CM

## 2013-02-24 DIAGNOSIS — I6529 Occlusion and stenosis of unspecified carotid artery: Secondary | ICD-10-CM | POA: Insufficient documentation

## 2013-02-24 DIAGNOSIS — I10 Essential (primary) hypertension: Secondary | ICD-10-CM

## 2013-02-24 DIAGNOSIS — K439 Ventral hernia without obstruction or gangrene: Secondary | ICD-10-CM

## 2013-02-24 NOTE — Addendum Note (Signed)
Addended by: Metro Kung on: 02/24/2013 01:17 PM   Modules accepted: Orders

## 2013-02-24 NOTE — Progress Notes (Signed)
  Subjective:    Patient ID: [name], female    DOB: 1955/10/19, 57 y.o.   MRN: [id number]  [facility] Was admitted on sept 8 for chest pain.no further chest pain. Had a fairly complete workup. ER notes reviewed. Negative enzymes. Negative telemetry. In fact had a nuclear medicine stress test which was negative.  Notes ongoing mid abdominal pain. Worse with motions. Comes and goes fairly sharp pain. Associated with swelling. Has had multiple surgeries for abdominal wall hernia Need to have her  Needs a CT scan   and a referral for hernia.   Concerns about neck pain . Sharp in nature. Sometimes worse with motion s whether this may be connected to history of carotid involvemen  having confusion.see below.  Hx of partial blockage on carotid ultrasounds. Episodes of disorientation, weakness, and fatige, had to go to ER. MRI showed possible old infarction, Requesting a carotid ultrasound.   Pt notes frequent pain in neck.    Review of Systems  no chest pain currently no back pain no shortness of breath ROS otherwise negative     Objective:   Physical Exam   alert significant obesity present no acute distress lungs clear. Heart regular in rhythm. Abdomen ventral hernia palpated    #1 chest pain cardiac ischemia ruled out. #2 chronic abdominal hernia patient would like a no other surgery referral to [name]. #3 history of carotid abnormality patient once this fall. #4  Confusion spells intermittent very episodic not dealt into great detail and this visit. Plan carotid ultrasound. CT scan of abdomen. Referral per patient's request. I have advised with multiple abdominal surgeries to ask [name] clearly if he can help her with surgical intervention. Followup with [name] in a couple weeks regarding confusion and other chronic abnormalities. Of note often recommended to followup every 3 months and at times goes many months without seeing [name]. Advised patient to  also consider screening physical at [facility] none for a long time. WSL      Assessment impres impression see above

## 2013-02-25 ENCOUNTER — Ambulatory Visit: Payer: 59 | Admitting: Family Medicine

## 2013-02-28 ENCOUNTER — Ambulatory Visit (HOSPITAL_COMMUNITY)
Admission: RE | Admit: 2013-02-28 | Discharge: 2013-02-28 | Disposition: A | Discharge status: Home / Self Care | Payer: 59 | Source: Ambulatory Visit | Attending: Family Medicine | Admitting: Family Medicine

## 2013-02-28 DIAGNOSIS — I10 Essential (primary) hypertension: Secondary | ICD-10-CM | POA: Insufficient documentation

## 2013-02-28 DIAGNOSIS — R109 Unspecified abdominal pain: Secondary | ICD-10-CM | POA: Insufficient documentation

## 2013-02-28 DIAGNOSIS — K5730 Diverticulosis of large intestine without perforation or abscess without bleeding: Secondary | ICD-10-CM | POA: Insufficient documentation

## 2013-02-28 DIAGNOSIS — I6529 Occlusion and stenosis of unspecified carotid artery: Secondary | ICD-10-CM | POA: Insufficient documentation

## 2013-02-28 DIAGNOSIS — K439 Ventral hernia without obstruction or gangrene: Secondary | ICD-10-CM

## 2013-02-28 DIAGNOSIS — I6522 Occlusion and stenosis of left carotid artery: Secondary | ICD-10-CM

## 2013-02-28 DIAGNOSIS — E785 Hyperlipidemia, unspecified: Secondary | ICD-10-CM | POA: Insufficient documentation

## 2013-02-28 MED ORDER — IOHEXOL 300 MG/ML  SOLN
100.0000 mL | Freq: Once | INTRAMUSCULAR | Status: AC | PRN
  Administered 2013-02-28: 100 mL via INTRAVENOUS

## 2013-03-07 ENCOUNTER — Telehealth: Payer: Self-pay | Admitting: Family Medicine

## 2013-03-07 MED ORDER — TRAMADOL HCL 50 MG PO TABS: 50.0000 mg | ORAL_TABLET | Freq: Four times a day (QID) | ORAL | Status: DC | PRN

## 2013-03-07 NOTE — Telephone Encounter (Signed)
Patient needs Rx for tramadol to [facility]

## 2013-03-07 NOTE — Telephone Encounter (Signed)
May have 4 refills 

## 2013-03-07 NOTE — Telephone Encounter (Signed)
[facility]. Patient notified.

## 2013-03-10 ENCOUNTER — Ambulatory Visit: Payer: 59 | Admitting: Family Medicine

## 2013-03-14 ENCOUNTER — Encounter: Payer: Self-pay | Admitting: Family Medicine

## 2013-03-14 ENCOUNTER — Ambulatory Visit (INDEPENDENT_AMBULATORY_CARE_PROVIDER_SITE_OTHER): Payer: 59 | Admitting: Family Medicine

## 2013-03-14 VITALS — BP 130/72 | Ht 70.0 in | Wt 290.4 lb

## 2013-03-14 DIAGNOSIS — I10 Essential (primary) hypertension: Secondary | ICD-10-CM

## 2013-03-14 DIAGNOSIS — E785 Hyperlipidemia, unspecified: Secondary | ICD-10-CM

## 2013-03-14 DIAGNOSIS — E119 Type 2 diabetes mellitus without complications: Secondary | ICD-10-CM

## 2013-03-14 NOTE — Progress Notes (Signed)
  Subjective:    Patient ID: [name], female    DOB: 14-Mar-1956, 57 y.o.   MRN: [id number]  HPI Patient here to follow up on test The patient was seen today as part of a comprehensive diabetic check up. The patient had the following elements completed: -Review of medication compliance -Review of glucose monitoring results -Review of any complications do to high or low sugars -Diabetic foot exam was completed as part of today's visit. The following was also discussed: -Importance of yearly eye exams -Importance of following diabetic/low sugar-starch diet -Importance of exercise and regular activity -Importance of regular followup visits. -Most recent hemoglobin A1c were reviewed with the patient along with goals regarding diabetes.  The importance of checking cholesterol and urine micro-protein impressed upon the patient as well as wellness exam. Results of recent CAT scan of the abdomen as well as carotid ultrasound was discussed with the patient in detail Patient was encouraged to followup again in approximately 3 months Past medical history carotid artery disease hypertension diabetes  Review of Systems Denies chest tightness pressure pain shortness breath or swelling in the legs    Objective:   Physical Exam Lungs are clear hearts regular pulse normal diabetic foot exam was completed. Abdomen soft obese ventral hernia noted       Assessment & Plan:  Ventral hernia-follow through with Dr. Malvin Johns for repair Diabetes good control overall Importance of keeping cholesterol discussed in detail. The importance of trying to keep weight under control also discussed.  Repeat carotid Doppler studies in one years time.

## 2013-03-17 ENCOUNTER — Other Ambulatory Visit: Payer: Self-pay | Admitting: *Deleted

## 2013-03-17 MED ORDER — TRAMADOL HCL 50 MG PO TABS: 50.0000 mg | ORAL_TABLET | Freq: Four times a day (QID) | ORAL | Status: DC | PRN

## 2013-04-11 ENCOUNTER — Ambulatory Visit (INDEPENDENT_AMBULATORY_CARE_PROVIDER_SITE_OTHER): Payer: 59 | Admitting: Nurse Practitioner

## 2013-04-11 ENCOUNTER — Encounter: Payer: Self-pay | Admitting: Nurse Practitioner

## 2013-04-11 VITALS — BP 138/88 | Ht 70.0 in | Wt 296.6 lb

## 2013-04-11 DIAGNOSIS — E782 Mixed hyperlipidemia: Secondary | ICD-10-CM

## 2013-04-11 DIAGNOSIS — Z0000 Encounter for general adult medical examination without abnormal findings: Secondary | ICD-10-CM

## 2013-04-11 DIAGNOSIS — M797 Fibromyalgia: Secondary | ICD-10-CM

## 2013-04-11 DIAGNOSIS — Z01419 Encounter for gynecological examination (general) (routine) without abnormal findings: Secondary | ICD-10-CM

## 2013-04-11 DIAGNOSIS — E119 Type 2 diabetes mellitus without complications: Secondary | ICD-10-CM

## 2013-04-11 DIAGNOSIS — Z79899 Other long term (current) drug therapy: Secondary | ICD-10-CM

## 2013-04-11 DIAGNOSIS — IMO0001 Reserved for inherently not codable concepts without codable children: Secondary | ICD-10-CM

## 2013-04-11 MED ORDER — ROSUVASTATIN CALCIUM 10 MG PO TABS: 10.0000 mg | ORAL_TABLET | Freq: Every day | ORAL | Status: DC

## 2013-04-11 MED ORDER — TRAMADOL HCL 50 MG PO TABS: 50.0000 mg | ORAL_TABLET | Freq: Four times a day (QID) | ORAL | Status: DC | PRN

## 2013-04-11 MED ORDER — PHENTERMINE HCL 37.5 MG PO TABS: 37.5000 mg | ORAL_TABLET | Freq: Every day | ORAL | Status: DC

## 2013-04-14 ENCOUNTER — Other Ambulatory Visit: Payer: Self-pay | Admitting: Family Medicine

## 2013-04-19 ENCOUNTER — Encounter: Payer: Self-pay | Admitting: Nurse Practitioner

## 2013-04-19 DIAGNOSIS — M797 Fibromyalgia: Secondary | ICD-10-CM | POA: Insufficient documentation

## 2013-04-19 NOTE — Assessment & Plan Note (Signed)
Phentermine as directed, recheck in one month.

## 2013-04-19 NOTE — Progress Notes (Signed)
Subjective:    Patient ID: LUKE RIGSBEE, female    DOB: 15-Sep-1955, 57 y.o.   MRN: [id number]  HPI presents for wellness checkup. Married, same sexual partner. Gets regular foot exams and followup with her podiatrist. Began having worsening myalgias on pravastatin, has stopped this. Minimal activity. Has been under a lot of stress. Has done fairly well with her diet.    Review of Systems  Constitutional: Positive for fatigue. Negative for activity change and appetite change.  HENT: Positive for rhinorrhea. Negative for dental problem, ear pain, hearing loss and sore throat.   Eyes: Negative for visual disturbance.  Respiratory: Negative for cough, chest tightness, shortness of breath and wheezing.   Cardiovascular: Negative for chest pain and leg swelling.  Gastrointestinal: Negative for nausea, vomiting, abdominal pain, diarrhea, constipation and blood in stool.  Genitourinary: Negative for dysuria, urgency, frequency, vaginal discharge, enuresis, difficulty urinating and pelvic pain.  Musculoskeletal: Positive for myalgias.       Objective:   Physical Exam  Vitals reviewed. Constitutional: She is oriented to person, place, and time. She appears well-developed. No distress.  HENT:  Right Ear: External ear normal.  Left Ear: External ear normal.  Mouth/Throat: Oropharynx is clear and moist.  Neck: Normal range of motion. Neck supple. No tracheal deviation present. No thyromegaly present.  Cardiovascular: Normal rate, regular rhythm and normal heart sounds.  Exam reveals no gallop.   No murmur heard. Pulmonary/Chest: Effort normal and breath sounds normal.  Abdominal: Soft. She exhibits no distension. There is no tenderness.  Genitourinary: Vagina normal. No vaginal discharge found.  Musculoskeletal: She exhibits no edema.  Lymphadenopathy:    She has no cervical adenopathy.  Neurological: She is alert and oriented to person, place, and time.  Skin: Skin is warm and dry. No  rash noted.  Psychiatric: She has a normal mood and affect. Her behavior is normal.   Breast exam: No masses noted, axilla no adenopathy. External GU normal. Rectal exam normal, no stool for Hemoccult. Significant central obesity noted.        Assessment & Plan:  Well woman exam  Routine general medical examination at a health care facility - Plan: MM Digital Screening, POC Hemoccult Bld/Stl (3-Cd Home Screen), MM Digital Screening  Mixed hyperlipidemia - Plan: Lipid panel  Type II or unspecified type diabetes mellitus without mention of complication, not stated as uncontrolled - Plan: Microalbumin, urine, Hemoglobin A1c  Encounter for long-term (current) use of other medications - Plan: Hepatic function panel, Basic metabolic panel  Morbid obesity  Fibromyalgia    Meds ordered this encounter  Medications  . traMADol (ULTRAM) 50 MG tablet    Sig: Take 1 tablet (50 mg total) by mouth every 6 (six) hours as needed.    Dispense:  90 tablet    Refill:  2    Order Specific Question:  Supervising Provider    Answer:  Merlyn Albert [[id number]]  . rosuvastatin (CRESTOR) 10 MG tablet    Sig: Take 1 tablet (10 mg total) by mouth at bedtime. For cholesterol    Dispense:  90 tablet    Refill:  0    Order Specific Question:  Supervising Provider    Answer:  Merlyn Albert [[id number]]  . phentermine (ADIPEX-P) 37.5 MG tablet    Sig: Take 1 tablet (37.5 mg total) by mouth daily before breakfast.    Dispense:  30 tablet    Refill:  0    Order Specific Question:  Supervising Provider  Answer:  Merlyn Albert [[id number]]   DC Crestor and call if myalgias are severe. DC phentermine if any adverse effects. Otherwise lab work in 8-10 weeks. Strongly encourage weight loss regular activity and healthy diet. Discussed bariatric surgery. Followup in one month if she wishes to continue phentermine, next physical in one year.

## 2013-04-19 NOTE — Assessment & Plan Note (Signed)
Start Crestor 10 mg daily. Lab work in 8-10 weeks.

## 2013-04-19 NOTE — Assessment & Plan Note (Signed)
Continue tramadol as directed for pain. Encourage regular activity and weight loss.

## 2013-05-05 ENCOUNTER — Emergency Department (HOSPITAL_COMMUNITY): Payer: 59

## 2013-05-05 ENCOUNTER — Emergency Department (HOSPITAL_COMMUNITY)
Admission: EM | Admit: 2013-05-05 | Discharge: 2013-05-05 | Disposition: A | Discharge status: Home / Self Care | Payer: 59 | Attending: Emergency Medicine | Admitting: Emergency Medicine

## 2013-05-05 ENCOUNTER — Encounter (HOSPITAL_COMMUNITY): Payer: Self-pay | Admitting: Emergency Medicine

## 2013-05-05 DIAGNOSIS — M25512 Pain in left shoulder: Secondary | ICD-10-CM

## 2013-05-05 DIAGNOSIS — Z79899 Other long term (current) drug therapy: Secondary | ICD-10-CM | POA: Insufficient documentation

## 2013-05-05 DIAGNOSIS — IMO0001 Reserved for inherently not codable concepts without codable children: Secondary | ICD-10-CM | POA: Insufficient documentation

## 2013-05-05 DIAGNOSIS — R0602 Shortness of breath: Secondary | ICD-10-CM | POA: Insufficient documentation

## 2013-05-05 DIAGNOSIS — R059 Cough, unspecified: Secondary | ICD-10-CM | POA: Insufficient documentation

## 2013-05-05 DIAGNOSIS — M25519 Pain in unspecified shoulder: Secondary | ICD-10-CM | POA: Insufficient documentation

## 2013-05-05 DIAGNOSIS — Z7982 Long term (current) use of aspirin: Secondary | ICD-10-CM | POA: Insufficient documentation

## 2013-05-05 DIAGNOSIS — E785 Hyperlipidemia, unspecified: Secondary | ICD-10-CM | POA: Insufficient documentation

## 2013-05-05 DIAGNOSIS — R05 Cough: Secondary | ICD-10-CM | POA: Insufficient documentation

## 2013-05-05 DIAGNOSIS — F3289 Other specified depressive episodes: Secondary | ICD-10-CM | POA: Insufficient documentation

## 2013-05-05 DIAGNOSIS — F17200 Nicotine dependence, unspecified, uncomplicated: Secondary | ICD-10-CM | POA: Insufficient documentation

## 2013-05-05 DIAGNOSIS — Z794 Long term (current) use of insulin: Secondary | ICD-10-CM | POA: Insufficient documentation

## 2013-05-05 DIAGNOSIS — M1990 Unspecified osteoarthritis, unspecified site: Secondary | ICD-10-CM | POA: Insufficient documentation

## 2013-05-05 DIAGNOSIS — E119 Type 2 diabetes mellitus without complications: Secondary | ICD-10-CM | POA: Insufficient documentation

## 2013-05-05 DIAGNOSIS — F329 Major depressive disorder, single episode, unspecified: Secondary | ICD-10-CM | POA: Insufficient documentation

## 2013-05-05 DIAGNOSIS — R52 Pain, unspecified: Secondary | ICD-10-CM | POA: Insufficient documentation

## 2013-05-05 DIAGNOSIS — R079 Chest pain, unspecified: Secondary | ICD-10-CM | POA: Insufficient documentation

## 2013-05-05 DIAGNOSIS — F411 Generalized anxiety disorder: Secondary | ICD-10-CM | POA: Insufficient documentation

## 2013-05-05 DIAGNOSIS — Z8544 Personal history of malignant neoplasm of other female genital organs: Secondary | ICD-10-CM | POA: Insufficient documentation

## 2013-05-05 DIAGNOSIS — I10 Essential (primary) hypertension: Secondary | ICD-10-CM | POA: Insufficient documentation

## 2013-05-05 DIAGNOSIS — M549 Dorsalgia, unspecified: Secondary | ICD-10-CM | POA: Insufficient documentation

## 2013-05-05 MED ORDER — HYDROCODONE-ACETAMINOPHEN 5-325 MG PO TABS: 1.0000 | ORAL_TABLET | Freq: Four times a day (QID) | ORAL | Status: DC | PRN

## 2013-05-05 NOTE — ED Notes (Signed)
Patient with no complaints at this time. Respirations even and unlabored. Skin warm/dry. Discharge instructions reviewed with patient at this time. Patient given opportunity to voice concerns/ask questions. Patient discharged at this time and left Emergency Department with steady gait. Patient unable to sign discharge e-sig. She is left handed, but gave verbal understanding of discharge instructions and follow up.

## 2013-05-05 NOTE — ED Notes (Signed)
Patient c/o left shoulder pain that started 2 weeks ago. Patient states "It feels like something is jutting out." Denies any known injury. Patient reports that she started becoming SOB over weekend with cough. Denies any fevers.

## 2013-05-05 NOTE — ED Provider Notes (Signed)
CSN: [id number]     Arrival date & time 05/05/13  8295 History  This chart was scribed for Michele Jakes, MD by Bennett Scrape, ED Scribe. This patient was seen in room [facility]04/[facility]04 and the patient's care was started at 7:44 AM.   Chief Complaint  Patient presents with  . Shoulder Pain  . Shortness of Breath    Patient is a 57 y.o. female presenting with shoulder pain. The history is provided by the patient. No language interpreter was used.  Shoulder Pain This is a new problem. The current episode started more than 1 week ago. The problem occurs constantly. The problem has been gradually worsening. Associated symptoms include chest pain and shortness of breath. Pertinent negatives include no abdominal pain and no headaches. Exacerbated by: use of the left arm. Nothing relieves the symptoms. Treatments tried: Advil and Tramadol  The treatment provided mild relief.    HPI Comments: [name] is a 57 y.o. female who presents to the Emergency Department complaining of severe left shoulder pain that radiates into the left upper arm, left axilla, left upper chest and left upper back for the past 2 weeks. She describes that pain as a catching, pulling and grinding pain. She states that she has a h/o fibromyalgia with similar symptoms but became concerned when the symptoms worsening since onset. She admits that the pain is worse with movement but states that she is able to lift the arm above her head. She denies any known injuries to it. She reports an associated symptom of SOB that started this weekend after working 12 hours. She states that the SOB occurs with increased pain. She states that she has been taking Advil and Tramadol with minor improvement. She rates her pain an 8 out of 10 currently.   PCP is Dr. Lilyan Punt  Past Medical History  Diagnosis Date  . Hypertension   . Anxiety   . Depression   . Dysrhythmia     hx of A Fib  . Diabetes mellitus     niddm  .  Fibromyalgia   . Cancer     uterine  . Neuropathy   . Hyperlipidemia   . Atrial fibrillation   . Degenerative joint disease   . History of stress test 2007    NM stress test negative   Past Surgical History  Procedure Laterality Date  . Cholecystectomy  2007  . Tubal ligation  1978  . Abdominal hysterectomy  2000  . Thyroid cyst excision  1996    removal of nodule  . Knee arthroscopy      left knee-multiple  . Knee arthroscopy  2000    right knee  . Replacement total knee bilateral      2006  . Carpal tunnel release  2003    bilateral   . Hernia repair  2007    incisional hernia repair x2  . 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 Bone biopsy  04/11/2011    Procedure: BONE BIOPSY;  Surgeon: Dallas Schimke;  Location: [facility];  Service: Orthopedics;  Laterality: Left;  Bone Biopsy Left Foot Third Toe   . Colonoscopy    . 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 Anesthesia problems Neg Hx   . Hypotension Neg Hx   .  Malignant hyperthermia Neg Hx   . Pseudochol deficiency Neg Hx   . Heart attack Brother     about age 31   History  Substance Use Topics  . Smoking status: Current Every Day Smoker -- 0.25 packs/day for 40 years    Types: Cigarettes  . Smokeless tobacco: Never Used  . Alcohol Use: No   OB History   Grav Para Term Preterm Abortions TAB SAB Ect Mult Living   3 3 3       2      Review of Systems  Constitutional: Negative for fever and chills.  HENT: Negative for congestion and sore throat.   Eyes: Negative for visual disturbance.  Respiratory: Positive for cough and shortness of breath.   Cardiovascular: Positive for chest pain. Negative for leg swelling.  Gastrointestinal: Negative for nausea, vomiting, abdominal pain and diarrhea.  Genitourinary: Negative for  dysuria.  Musculoskeletal: Positive for arthralgias, back pain and myalgias (h/o fibromyaglias ). Negative for neck pain.  Skin: Negative for rash.  Neurological: Negative for headaches.  Hematological: Does not bruise/bleed easily.  Psychiatric/Behavioral: Negative for confusion.    Allergies  Chantix and Codeine  Home Medications   Current Outpatient Rx  Name  Route  Sig  Dispense  Refill  . ACCU-CHEK AVIVA PLUS test strip      TEST BLOOD GLUCOSE 4 TIMES DAILY AS DIRECTED   400 each   PRN   . aspirin 81 MG chewable tablet   Oral   Chew 1 tablet (81 mg total) by mouth daily.         Marland Kitchen diltiazem (CARDIZEM) 120 MG tablet   Oral   Take 120 mg by mouth daily.         Marland Kitchen HYDROcodone-acetaminophen (NORCO/VICODIN) 5-325 MG per tablet   Oral   Take 1 tablet by mouth every 6 (six) hours as needed. Pain         . ibuprofen (ADVIL,MOTRIN) 800 MG tablet   Oral   Take 1 tablet (800 mg total) by mouth daily.         . insulin glargine (LANTUS) 100 UNIT/ML injection   Subcutaneous   Inject 30 Units into the skin at bedtime.          . insulin lispro (HUMALOG) 100 UNIT/ML injection   Subcutaneous   Inject 10 Units into the skin 3 (three) times daily.          Marland Kitchen lisinopril (PRINIVIL,ZESTRIL) 5 MG tablet   Oral   Take 2.5 mg by mouth daily.         . Multiple Vitamin (MULTIVITAMIN) tablet   Oral   Take 1 tablet by mouth daily.         Marland Kitchen PARoxetine (PAXIL) 20 MG tablet   Oral   Take 80 mg by mouth daily.         . phentermine (ADIPEX-P) 37.5 MG tablet   Oral   Take 1 tablet (37.5 mg total) by mouth daily before breakfast.   30 tablet   0   . rosuvastatin (CRESTOR) 10 MG tablet   Oral   Take 1 tablet (10 mg total) by mouth at bedtime. For cholesterol   90 tablet   0   . Sodium & Potassium Bicarbonate (ALKA-AID) 360-180 MG TABS   Oral   Take 2 tablets by mouth every 4 (four) hours as needed. gas         . traMADol (ULTRAM) 50 MG tablet   Oral  Take 1 tablet (50 mg total) by mouth every 6 (six) hours as needed.   90 tablet   2    Triage Vitals: BP 183/67  Pulse 69  Temp(Src) 98 F (36.7 C) (Oral)  Resp 22  Ht 5\' 10"  (1.778 m)  Wt 285 lb (129.275 kg)  BMI 40.89 kg/m2  SpO2 100%  Physical Exam  Nursing note and vitals reviewed. Constitutional: She is oriented to person, place, and time. She appears well-developed and well-nourished. No distress.  HENT:  Head: Normocephalic and atraumatic.  Mouth/Throat: Oropharynx is clear and moist.  Eyes: Conjunctivae and EOM are normal. Pupils are equal, round, and reactive to light.  Sclera are clear  Neck: Neck supple. No tracheal deviation present.  Cardiovascular: Normal rate and regular rhythm.   No murmur heard. Pulses:      Radial pulses are 2+ on the left side.       Dorsalis pedis pulses are 2+ on the right side, and 2+ on the left side.  Pulmonary/Chest: Effort normal and breath sounds normal. No respiratory distress. She has no wheezes.  Abdominal: Soft. Bowel sounds are normal. She exhibits no distension. There is no tenderness.  Musculoskeletal: She exhibits no edema (no ankle swelling).  No tenderness of the left wrist or elbow. No obvious deformity of the left shoulder. Discomfort at 90 degrees. Good capillary refill of the left arm.  Lymphadenopathy:    She has no cervical adenopathy.  Neurological: She is alert and oriented to person, place, and time. No cranial nerve deficit.  Pt able to move both sets of fingers and toes  Skin: Skin is warm and dry. No rash noted.  Psychiatric: She has a normal mood and affect. Her behavior is normal.    ED Course  Procedures (including critical care time)  DIAGNOSTIC STUDIES: Oxygen Saturation is 100% on room air, normal by my interpretation.    COORDINATION OF CARE: 7:49 AM-Discussed treatment plan which includes a x-ray of the left shoulder and chest with pt at bedside and pt agreed to plan.   Labs Review Labs  Reviewed - No data to display Imaging Review Dg Chest 2 View  05/05/2013   CLINICAL DATA:  Shortness of breath.  Left shoulder pain.  EXAM: CHEST  2 VIEW  COMPARISON:  PA and lateral chest 02/02/2013 and single view of the chest 05/08/2012.  FINDINGS: The lungs are clear. Heart size is normal. No pneumothorax or pleural fluid. No focal bony abnormality.  IMPRESSION: No acute disease.   Electronically Signed   By: Drusilla Kanner M.D.   On: 05/05/2013 08:09   Dg Shoulder Left  05/05/2013   CLINICAL DATA:  Left shoulder pain.  EXAM: LEFT SHOULDER - 2+ VIEW  COMPARISON:  None.  FINDINGS: There is no evidence of fracture or dislocation. Visualized ribs appear normal. There is no evidence of arthropathy or other focal bone abnormality. Soft tissues are unremarkable.  IMPRESSION: Normal left shoulder.   Electronically Signed   By: Roque Lias M.D.   On: 05/05/2013 08:07    EKG Interpretation    Date/Time:  Monday May 05 2013 07:31:37 EST Ventricular Rate:  74 PR Interval:  152 QRS Duration: 90 QT Interval:  370 QTC Calculation: 410 R Axis:   94 Text Interpretation:  Normal sinus rhythm Rightward axis Borderline ECG When compared with ECG of 02-Feb-2013 21:12, No significant change was found Confirmed by Eisen Robenson  MD, Aiyah Scarpelli ([id number]) on 05/05/2013 7:51:58 AM  MDM   1. Shoulder pain, acute, left    Left shoulder pain not consistent with rotator cuff injury. Could be consistent with a a.c. impingement syndrome. X-rays of the left shoulder showed no bony abnormalities. Followup with primary care Dr. or orthopedics would be appropriate. We'll treat the left shoulder with a sling to rest. Patient's pain in the shoulder radiates to the chest none of this consistent with coronary type chest pain. Shortness of breath is present when the pain in the shoulder is worse. Otherwise no significant shortness of breath. Patient however has had an upper respiratory infection for the past few  days with a cough chest x-ray here today shows no evidence of pneumonia or pneumothorax pulmonary edema or pleural fluid. Will discharge home with followup with primary care Dr. in orthopedics and pain medication.  I personally performed the services described in this documentation, which was scribed in my presence. The recorded information has been reviewed and is accurate.     Michele Jakes, MD 05/05/13 [phone]

## 2013-05-12 ENCOUNTER — Ambulatory Visit: Payer: 59 | Admitting: Orthopedic Surgery

## 2013-06-12 ENCOUNTER — Telehealth: Payer: Self-pay | Admitting: Family Medicine

## 2013-06-12 ENCOUNTER — Other Ambulatory Visit: Payer: Self-pay | Admitting: Family Medicine

## 2013-06-12 MED ORDER — DILTIAZEM HCL 120 MG PO TABS: 120.0000 mg | ORAL_TABLET | Freq: Every day | ORAL | Status: DC

## 2013-06-12 NOTE — Telephone Encounter (Signed)
Patient needs Rx for Cardizem   [facility]

## 2013-06-12 NOTE — Telephone Encounter (Signed)
Medication sent in to pharmacy. Patient was notified.  

## 2013-08-07 ENCOUNTER — Emergency Department (HOSPITAL_COMMUNITY)
Admission: EM | Admit: 2013-08-07 | Discharge: 2013-08-07 | Disposition: A | Discharge status: Home / Self Care | Payer: 59 | Attending: Emergency Medicine | Admitting: Emergency Medicine

## 2013-08-07 ENCOUNTER — Encounter (HOSPITAL_COMMUNITY): Payer: Self-pay | Admitting: Emergency Medicine

## 2013-08-07 ENCOUNTER — Emergency Department (HOSPITAL_COMMUNITY): Payer: 59

## 2013-08-07 DIAGNOSIS — E785 Hyperlipidemia, unspecified: Secondary | ICD-10-CM | POA: Insufficient documentation

## 2013-08-07 DIAGNOSIS — W540XXA Bitten by dog, initial encounter: Secondary | ICD-10-CM | POA: Insufficient documentation

## 2013-08-07 DIAGNOSIS — M1990 Unspecified osteoarthritis, unspecified site: Secondary | ICD-10-CM | POA: Insufficient documentation

## 2013-08-07 DIAGNOSIS — S61509A Unspecified open wound of unspecified wrist, initial encounter: Secondary | ICD-10-CM | POA: Insufficient documentation

## 2013-08-07 DIAGNOSIS — I10 Essential (primary) hypertension: Secondary | ICD-10-CM | POA: Insufficient documentation

## 2013-08-07 DIAGNOSIS — S61459A Open bite of unspecified hand, initial encounter: Secondary | ICD-10-CM

## 2013-08-07 DIAGNOSIS — Z794 Long term (current) use of insulin: Secondary | ICD-10-CM | POA: Insufficient documentation

## 2013-08-07 DIAGNOSIS — E119 Type 2 diabetes mellitus without complications: Secondary | ICD-10-CM | POA: Insufficient documentation

## 2013-08-07 DIAGNOSIS — F17200 Nicotine dependence, unspecified, uncomplicated: Secondary | ICD-10-CM | POA: Insufficient documentation

## 2013-08-07 DIAGNOSIS — R202 Paresthesia of skin: Secondary | ICD-10-CM

## 2013-08-07 DIAGNOSIS — F3289 Other specified depressive episodes: Secondary | ICD-10-CM | POA: Insufficient documentation

## 2013-08-07 DIAGNOSIS — Z791 Long term (current) use of non-steroidal anti-inflammatories (NSAID): Secondary | ICD-10-CM | POA: Insufficient documentation

## 2013-08-07 DIAGNOSIS — R209 Unspecified disturbances of skin sensation: Secondary | ICD-10-CM | POA: Insufficient documentation

## 2013-08-07 DIAGNOSIS — Z7982 Long term (current) use of aspirin: Secondary | ICD-10-CM

## 2013-08-07 DIAGNOSIS — Z23 Encounter for immunization: Secondary | ICD-10-CM

## 2013-08-07 DIAGNOSIS — Y92009 Unspecified place in unspecified non-institutional (private) residence as the place of occurrence of the external cause: Secondary | ICD-10-CM

## 2013-08-07 DIAGNOSIS — Y9389 Activity, other specified: Secondary | ICD-10-CM

## 2013-08-07 DIAGNOSIS — Z8542 Personal history of malignant neoplasm of other parts of uterus: Secondary | ICD-10-CM

## 2013-08-07 DIAGNOSIS — I4891 Unspecified atrial fibrillation: Secondary | ICD-10-CM

## 2013-08-07 DIAGNOSIS — F411 Generalized anxiety disorder: Secondary | ICD-10-CM

## 2013-08-07 DIAGNOSIS — Z79899 Other long term (current) drug therapy: Secondary | ICD-10-CM

## 2013-08-07 DIAGNOSIS — F329 Major depressive disorder, single episode, unspecified: Secondary | ICD-10-CM

## 2013-08-07 DIAGNOSIS — IMO0001 Reserved for inherently not codable concepts without codable children: Secondary | ICD-10-CM

## 2013-08-07 MED ORDER — AMOXICILLIN-POT CLAVULANATE 875-125 MG PO TABS: 1.0000 | ORAL_TABLET | Freq: Two times a day (BID) | ORAL | Status: DC

## 2013-08-07 MED ORDER — TETANUS-DIPHTH-ACELL PERTUSSIS 5-2.5-18.5 LF-MCG/0.5 IM SUSP
0.5000 mL | Freq: Once | INTRAMUSCULAR | Status: AC
Start: 2013-08-07 — End: 2013-08-07
  Administered 2013-08-07: 0.5 mL via INTRAMUSCULAR
  Filled 2013-08-07: qty 0.5

## 2013-08-07 NOTE — ED Notes (Signed)
Patient c/o numbness, tingling, and weakness in right hand. Per patient bite by dog 5 weeks ago on right wrist. Per patient her dog and vaccines up to date. Patient denies any fevers. Per patient purulent drainage in first week but she used Bactroban which stopped drainage.

## 2013-08-07 NOTE — Discharge Instructions (Signed)

## 2013-08-09 NOTE — ED Provider Notes (Signed)
CSN: [id number]     Arrival date & time 08/07/13  4098 History   First MD Initiated Contact with Patient 08/07/13 0809     Chief Complaint  Patient presents with  . Animal Bite     (Consider location/radiation/quality/duration/timing/severity/associated sxs/prior Treatment) Patient is a 58 y.o. female presenting with animal bite. The history is provided by the patient.  Animal Bite Contact animal:  Dog Location:  Hand Hand injury location:  R wrist Time since incident:  5 weeks Pain details:    Quality:  Localized and tingling   Severity:  Mild   Timing:  Constant   Progression:  Unchanged Incident location:  Home Notifications:  None Animal's rabies vaccination status:  Up to date Animal in possession: yes   Tetanus status:  Unknown Relieved by:  Nothing Worsened by:  Nothing tried Ineffective treatments: bactroban cream. Associated symptoms: numbness   Associated symptoms: no fever, no rash and no swelling    Patient reports hx of "tingling and numbness" to the dorsum of the right hand for 5 weeks after a small puncture wound from her own dog's tooth on the right wrist.  She states the animal's vaccinations are up to date and dog is healthy.  She denies swelling, red streaks, drainage, weakness of the hand, numbness or tingling of the fingers, or pain with wrist of finger movement.   Past Medical History  Diagnosis Date  . Hypertension   . Anxiety   . Depression   . Dysrhythmia     hx of A Fib  . Diabetes mellitus     niddm  . Fibromyalgia   . Cancer     uterine  . Neuropathy   . Hyperlipidemia   . Atrial fibrillation   . Degenerative joint disease   . History of stress test 2007    NM stress test negative   Past Surgical History  Procedure Laterality Date  . Cholecystectomy  2007  . Tubal ligation  1978  . Abdominal hysterectomy  2000  . Thyroid cyst excision  1996    removal of nodule  . Knee arthroscopy      left knee-multiple  . Knee arthroscopy   2000    right knee  . Replacement total knee bilateral      2006  . Carpal tunnel release  2003    bilateral   . Hernia repair  2007    incisional hernia repair x2  . 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 Bone biopsy  04/11/2011    Procedure: BONE BIOPSY;  Surgeon: Dallas Schimke;  Location: [facility];  Service: Orthopedics;  Laterality: Left;  Bone Biopsy Left Foot Third Toe   . Colonoscopy    . 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 Anesthesia problems Neg Hx   . Hypotension Neg Hx   . Malignant hyperthermia Neg Hx   . Pseudochol deficiency Neg Hx   . Heart attack Brother     about age 55   History  Substance Use Topics  . Smoking status: Current Every Day Smoker -- 0.25 packs/day for 40 years    Types: Cigarettes  . Smokeless tobacco: Never Used  . Alcohol Use: No   OB History   Grav Para Term Preterm Abortions TAB SAB Ect Mult Living  3 3 3       2      Review of Systems  Constitutional: Negative for fever, chills, activity change and appetite change.  HENT: Negative for facial swelling, sore throat and trouble swallowing.   Respiratory: Negative for chest tightness, shortness of breath and wheezing.   Musculoskeletal: Negative for joint swelling, neck pain and neck stiffness.  Skin: Positive for wound. Negative for rash.       Tingling to dorsal right hand and puncture wound to right wrist  Neurological: Positive for numbness. Negative for dizziness, weakness and headaches.  All other systems reviewed and are negative.      Allergies  Chantix and Codeine  Home Medications   Current Outpatient Rx  Name  Route  Sig  Dispense  Refill  . ACCU-CHEK AVIVA PLUS test strip      TEST BLOOD GLUCOSE 4 TIMES DAILY AS DIRECTED   400 each   PRN   . amoxicillin-clavulanate  (AUGMENTIN) 875-125 MG per tablet   Oral   Take 1 tablet by mouth 2 (two) times daily.   20 tablet   0   . aspirin 81 MG chewable tablet   Oral   Chew 1 tablet (81 mg total) by mouth daily.         Marland Kitchen diltiazem (CARDIZEM) 120 MG tablet   Oral   Take 1 tablet (120 mg total) by mouth daily.   30 tablet   5   . ibuprofen (ADVIL,MOTRIN) 800 MG tablet   Oral   Take 1 tablet (800 mg total) by mouth daily.         . insulin glargine (LANTUS) 100 UNIT/ML injection   Subcutaneous   Inject 30 Units into the skin at bedtime.          . insulin lispro (HUMALOG) 100 UNIT/ML injection   Subcutaneous   Inject 10 Units into the skin 3 (three) times daily.          Marland Kitchen LANTUS SOLOSTAR 100 UNIT/ML Solostar Pen      MAY TITRATE UP TO 40 UNITS NIGHTLY   45 mL   2   . lisinopril (PRINIVIL,ZESTRIL) 5 MG tablet   Oral   Take 2.5 mg by mouth daily.         . Multiple Vitamin (MULTIVITAMIN) tablet   Oral   Take 1 tablet by mouth daily.         Marland Kitchen PARoxetine (PAXIL) 20 MG tablet   Oral   Take 80 mg by mouth daily.         . rosuvastatin (CRESTOR) 10 MG tablet   Oral   Take 1 tablet (10 mg total) by mouth at bedtime. For cholesterol   90 tablet   0    BP 147/67  Pulse 59  Temp(Src) 97.5 F (36.4 C) (Oral)  Resp 24  Ht 5\' 10"  (1.778 m)  Wt 290 lb (131.543 kg)  BMI 41.61 kg/m2  SpO2 97% Physical Exam  Nursing note and vitals reviewed. Constitutional: She is oriented to person, place, and time. She appears well-developed and well-nourished. No distress.  HENT:  Head: Normocephalic and atraumatic.  Cardiovascular: Normal rate, regular rhythm, normal heart sounds and intact distal pulses.   No murmur heard. Pulmonary/Chest: Effort normal and breath sounds normal. No respiratory distress. She exhibits no tenderness.  Musculoskeletal: Normal range of motion. She exhibits no edema and no tenderness.       Right hand: She exhibits normal range of motion, no  bony  tenderness, normal two-point discrimination, normal capillary refill, no deformity, no laceration and no swelling. Normal sensation noted. Decreased sensation is not present in the ulnar distribution, is not present in the medial distribution and is not present in the radial distribution. Normal strength noted. She exhibits no finger abduction, no thumb/finger opposition and no wrist extension trouble.       Hands: Neurological: She is alert and oriented to person, place, and time. She exhibits normal muscle tone. Coordination normal.  Skin: Skin is warm and dry. No rash noted.    ED Course  Procedures (including critical care time) Labs Review Labs Reviewed - No data to display Imaging Review Dg Wrist Complete Right  08/07/2013   CLINICAL DATA:  Dog bite 5 weeks ago, some numbness  EXAM: RIGHT WRIST - COMPLETE 3+ VIEW  COMPARISON:  None.  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 Lesser degenerative change involves the base of the second metacarpal as well articulating with the trapezium and trapezoid. No fracture is seen. Intercarpal joint spaces appear normal.  IMPRESSION: Degenerative change at the articulations of the bases of the first and second metacarpals with carpals. No acute abnormality.   Electronically Signed   By: Dwyane DeePaul  Barry M.D.   On: 08/07/2013 08:46    EKG Interpretation None      MDM   Final diagnoses:  Animal bite of hand  Paresthesias    Pt has 775 week old puncture wound to right wrist secondary to dog bite.  No clinical signs of infection, or cellulitis.  Pt has full ROM of all fingers and wrist, paresthesias reported to dorsal hand only. Compartments are soft.  XR negative for osteo or fx, Td updated.  Discussed pt with Dr. Clarene DukeMcManus.   Prescribed augmentin , pt agrees to close f/u with hand surgery.  Referral given to Dr. Amanda PeaGramig.   Patient agrees to care plan and agrees to return if sx's worsen.  She appears stable for d/c    Darriel Sinquefield L. Trisha Mangleriplett, PA-C 08/09/13 1411

## 2013-08-11 NOTE — ED Provider Notes (Signed)
Medical screening examination/treatment/procedure(s) were performed by non-physician practitioner and as supervising physician I was immediately available for consultation/collaboration.   EKG Interpretation None        Yzabella Crunk M Giankarlo Leamer, DO 08/11/13 1822 

## 2013-08-15 ENCOUNTER — Ambulatory Visit: Payer: 59 | Admitting: Nurse Practitioner

## 2013-09-04 ENCOUNTER — Other Ambulatory Visit: Payer: Self-pay | Admitting: Family Medicine

## 2013-10-17 ENCOUNTER — Telehealth: Payer: Self-pay | Admitting: Family Medicine

## 2013-10-17 MED ORDER — INSULIN LISPRO 100 UNIT/ML ~~LOC~~ SOLN: 10.0000 [IU] | Freq: Three times a day (TID) | SUBCUTANEOUS | Status: DC

## 2013-10-17 MED ORDER — INSULIN GLARGINE 100 UNIT/ML SOLOSTAR PEN: PEN_INJECTOR | SUBCUTANEOUS | Status: DC

## 2013-10-17 NOTE — Telephone Encounter (Signed)
done

## 2013-10-17 NOTE — Telephone Encounter (Signed)
Patient needs Rx for lantus solostar 100 units and humalog 100 units.

## 2013-10-21 ENCOUNTER — Ambulatory Visit (INDEPENDENT_AMBULATORY_CARE_PROVIDER_SITE_OTHER): Payer: 59 | Admitting: Family Medicine

## 2013-10-21 ENCOUNTER — Encounter: Payer: Self-pay | Admitting: Family Medicine

## 2013-10-21 VITALS — BP 130/72 | Ht 70.0 in | Wt 283.0 lb

## 2013-10-21 DIAGNOSIS — E119 Type 2 diabetes mellitus without complications: Secondary | ICD-10-CM

## 2013-10-21 DIAGNOSIS — E1142 Type 2 diabetes mellitus with diabetic polyneuropathy: Secondary | ICD-10-CM

## 2013-10-21 DIAGNOSIS — E1149 Type 2 diabetes mellitus with other diabetic neurological complication: Secondary | ICD-10-CM

## 2013-10-21 DIAGNOSIS — E1140 Type 2 diabetes mellitus with diabetic neuropathy, unspecified: Secondary | ICD-10-CM

## 2013-10-21 DIAGNOSIS — E785 Hyperlipidemia, unspecified: Secondary | ICD-10-CM

## 2013-10-21 DIAGNOSIS — Z79899 Other long term (current) drug therapy: Secondary | ICD-10-CM

## 2013-10-21 LAB — POCT GLYCOSYLATED HEMOGLOBIN (HGB A1C): HEMOGLOBIN A1C: 7.5

## 2013-10-21 MED ORDER — PREGABALIN 50 MG PO CAPS: 50.0000 mg | ORAL_CAPSULE | Freq: Two times a day (BID) | ORAL | Status: DC

## 2013-10-21 MED ORDER — CANAGLIFLOZIN 100 MG PO TABS: 1.0000 | ORAL_TABLET | Freq: Every morning | ORAL | Status: DC

## 2013-10-21 NOTE — Progress Notes (Signed)
   Subjective:    Patient ID: [name], female    DOB: [birth year], 58 y.o.   MRN: [id number]  HPIIncreased problems with peripheral neuropathy. A1C 7.5. Denies sweats chills nausea vomiting states sugars are running a little higher than normal. Edema in legs and feet. Started 1 -2 months ago. Patient denies PND denies orthopnea. Significant neuropathy in the feet as well as lower legs sometimes a discomfort keeps her awake.  The patient was seen today as part of a comprehensive diabetic check up. The patient had the following elements completed: -Review of medication compliance -Review of glucose monitoring results -Review of any complications do to high or low sugars -Diabetic foot exam was completed as part of today's visit. The following was also discussed: -Importance of yearly eye exams -Importance of following diabetic/low sugar-starch diet -Importance of exercise and regular activity -Importance of regular followup visits. -Most recent hemoglobin A1c were reviewed with the patient along with goals regarding diabetes.  Review of Systems  Constitutional: Negative for activity change, appetite change and fatigue.  HENT: Negative for congestion.   Respiratory: Negative for shortness of breath, wheezing and stridor.   Cardiovascular: Negative for chest pain.  Endocrine: Negative for polydipsia and polyphagia.  Genitourinary: Negative for frequency.  Neurological: Negative for weakness.  Psychiatric/Behavioral: Negative for confusion.       Objective:   Physical Exam  Vitals reviewed. Constitutional: She appears well-nourished. No distress.  Cardiovascular: Normal rate, regular rhythm and normal heart sounds.   No murmur heard. Pulmonary/Chest: Effort normal and breath sounds normal. No respiratory distress.  Musculoskeletal: She exhibits no edema.  Lymphadenopathy:    She has no cervical adenopathy.  Neurological: She is alert. She exhibits normal muscle tone.    Psychiatric: Her behavior is normal.   Diabetic foot exam completed seated other details      Assessment & Plan:  Diabetic neuropathy-use Lyrica twice daily followup in 3 months Diabetes subpar control start Invokana 100 mg once daily this should help get A1c under better control might help her with her swelling and weight loss as well Hyperlipidemia check lipid profile await results HTN decent control Obesity patient work on diet and exercise and try to lose weight. Followup 3 months get labs done before the next visit.

## 2013-11-13 ENCOUNTER — Other Ambulatory Visit: Payer: Self-pay | Admitting: Nurse Practitioner

## 2013-11-13 NOTE — Telephone Encounter (Signed)
May refill this x4

## 2013-12-11 ENCOUNTER — Other Ambulatory Visit: Payer: Self-pay | Admitting: Family Medicine

## 2013-12-12 ENCOUNTER — Other Ambulatory Visit: Payer: Self-pay | Admitting: Family Medicine

## 2014-01-08 LAB — BASIC METABOLIC PANEL
BUN: 14 mg/dL (ref 6–23)
CALCIUM: 10.7 mg/dL — AB (ref 8.4–10.5)
CO2: 25 meq/L (ref 19–32)
CREATININE: 0.51 mg/dL (ref 0.50–1.10)
Chloride: 107 mEq/L (ref 96–112)
Glucose, Bld: 117 mg/dL — ABNORMAL HIGH (ref 70–99)
Potassium: 4.5 mEq/L (ref 3.5–5.3)
SODIUM: 138 meq/L (ref 135–145)

## 2014-01-08 LAB — LIPID PANEL
Cholesterol: 228 mg/dL — ABNORMAL HIGH (ref 0–200)
HDL: 49 mg/dL (ref >39)
LDL Cholesterol: 137 mg/dL — ABNORMAL HIGH (ref 0–99)
Total CHOL/HDL Ratio: 4.7 Ratio
Triglycerides: 210 mg/dL — ABNORMAL HIGH (ref <150)
VLDL: 42 mg/dL — ABNORMAL HIGH (ref 0–40)

## 2014-01-08 LAB — HEPATIC FUNCTION PANEL
ALK PHOS: 87 U/L (ref 39–117)
ALT: 31 U/L (ref 0–35)
AST: 20 U/L (ref 0–37)
Albumin: 4.2 g/dL (ref 3.5–5.2)
BILIRUBIN DIRECT: 0.1 mg/dL (ref 0.0–0.3)
BILIRUBIN TOTAL: 0.5 mg/dL (ref 0.2–1.2)
Indirect Bilirubin: 0.4 mg/dL (ref 0.2–1.2)
Total Protein: 6.9 g/dL (ref 6.0–8.3)

## 2014-01-08 LAB — HEMOGLOBIN A1C
Hgb A1c MFr Bld: 7.7 % — ABNORMAL HIGH (ref <5.7)
Mean Plasma Glucose: 174 mg/dL — ABNORMAL HIGH (ref <117)

## 2014-01-09 ENCOUNTER — Encounter: Payer: Self-pay | Admitting: Family Medicine

## 2014-01-09 ENCOUNTER — Ambulatory Visit (INDEPENDENT_AMBULATORY_CARE_PROVIDER_SITE_OTHER): Payer: 59 | Admitting: Family Medicine

## 2014-01-09 VITALS — BP 138/80 | Ht 70.0 in | Wt 299.0 lb

## 2014-01-09 DIAGNOSIS — E119 Type 2 diabetes mellitus without complications: Secondary | ICD-10-CM

## 2014-01-09 DIAGNOSIS — E1140 Type 2 diabetes mellitus with diabetic neuropathy, unspecified: Secondary | ICD-10-CM

## 2014-01-09 DIAGNOSIS — E1149 Type 2 diabetes mellitus with other diabetic neurological complication: Secondary | ICD-10-CM

## 2014-01-09 DIAGNOSIS — I10 Essential (primary) hypertension: Secondary | ICD-10-CM

## 2014-01-09 DIAGNOSIS — E785 Hyperlipidemia, unspecified: Secondary | ICD-10-CM

## 2014-01-09 DIAGNOSIS — Z23 Encounter for immunization: Secondary | ICD-10-CM

## 2014-01-09 DIAGNOSIS — E1142 Type 2 diabetes mellitus with diabetic polyneuropathy: Secondary | ICD-10-CM

## 2014-01-09 MED ORDER — PREGABALIN 100 MG PO CAPS: 100.0000 mg | ORAL_CAPSULE | Freq: Two times a day (BID) | ORAL | Status: DC

## 2014-01-09 MED ORDER — CANAGLIFLOZIN 300 MG PO TABS: 300.0000 mg | ORAL_TABLET | ORAL | Status: DC

## 2014-01-09 MED ORDER — TRAMADOL HCL 50 MG PO TABS: ORAL_TABLET | ORAL | Status: DC

## 2014-01-09 NOTE — Progress Notes (Signed)
   Subjective:    Patient ID: Michele NettlesCynthia D [name], female    DOB: August 05, 1955, 58 y.o.   MRN: [id number]  HPI Comments: Had BW done on 8/12  Diabetes She presents for her follow-up diabetic visit. She has type 2 diabetes mellitus. Hypoglycemia symptoms include hunger and sleepiness. Associated symptoms include fatigue and weakness. She is currently taking insulin pre-breakfast and pre-dinner. She monitors blood glucose at home 1-2 x per day. Her overall blood glucose range is 180-200 mg/dl. She sees a podiatrist.Eye exam is current.   Past problems with diabetic neuropathy pain and discomfort. Would like a higher dose of Lyrica she would also like to use tramadol 4 times a day because of she denies abusing it   Review of Systems  Constitutional: Positive for fatigue.  Neurological: Positive for weakness.   denies chest pain shortness of breath nausea vomiting diarrhea     Objective:   Physical Exam Lungs are clear heart regular pulse normal blood pressure recheck is good she does have diabetic E. changes. In both. feet       Assessment & Plan:  25 minutes spent with the patient discussing diabetes diabetes control the importance of getting your cholesterol under control patient does not want to be on staff the side effects. She will try red rice yeast extract. She does see podiatry for her feet. She will followup here in 3 months to recheck A1c.  She was shown how to adjust her insulin she will do so and she will followup with us in 3 months she will send us any readings.

## 2014-01-10 ENCOUNTER — Encounter (HOSPITAL_COMMUNITY): Payer: Self-pay | Admitting: Emergency Medicine

## 2014-01-10 ENCOUNTER — Emergency Department (HOSPITAL_COMMUNITY)
Admission: EM | Admit: 2014-01-10 | Discharge: 2014-01-10 | Disposition: A | Discharge status: Home / Self Care | Payer: 59 | Attending: Emergency Medicine | Admitting: Emergency Medicine

## 2014-01-10 ENCOUNTER — Emergency Department (HOSPITAL_COMMUNITY): Payer: 59

## 2014-01-10 DIAGNOSIS — Z794 Long term (current) use of insulin: Secondary | ICD-10-CM | POA: Insufficient documentation

## 2014-01-10 DIAGNOSIS — E1169 Type 2 diabetes mellitus with other specified complication: Secondary | ICD-10-CM | POA: Diagnosis not present

## 2014-01-10 DIAGNOSIS — F329 Major depressive disorder, single episode, unspecified: Secondary | ICD-10-CM | POA: Insufficient documentation

## 2014-01-10 DIAGNOSIS — F3289 Other specified depressive episodes: Secondary | ICD-10-CM | POA: Insufficient documentation

## 2014-01-10 DIAGNOSIS — M7989 Other specified soft tissue disorders: Secondary | ICD-10-CM | POA: Diagnosis present

## 2014-01-10 DIAGNOSIS — Z8669 Personal history of other diseases of the nervous system and sense organs: Secondary | ICD-10-CM | POA: Insufficient documentation

## 2014-01-10 DIAGNOSIS — Z8542 Personal history of malignant neoplasm of other parts of uterus: Secondary | ICD-10-CM | POA: Insufficient documentation

## 2014-01-10 DIAGNOSIS — L97509 Non-pressure chronic ulcer of other part of unspecified foot with unspecified severity: Secondary | ICD-10-CM | POA: Diagnosis not present

## 2014-01-10 DIAGNOSIS — I10 Essential (primary) hypertension: Secondary | ICD-10-CM | POA: Insufficient documentation

## 2014-01-10 DIAGNOSIS — E08621 Diabetes mellitus due to underlying condition with foot ulcer: Secondary | ICD-10-CM

## 2014-01-10 DIAGNOSIS — F17200 Nicotine dependence, unspecified, uncomplicated: Secondary | ICD-10-CM | POA: Diagnosis not present

## 2014-01-10 DIAGNOSIS — Z8739 Personal history of other diseases of the musculoskeletal system and connective tissue: Secondary | ICD-10-CM | POA: Insufficient documentation

## 2014-01-10 DIAGNOSIS — L97519 Non-pressure chronic ulcer of other part of right foot with unspecified severity: Secondary | ICD-10-CM

## 2014-01-10 DIAGNOSIS — F411 Generalized anxiety disorder: Secondary | ICD-10-CM | POA: Diagnosis not present

## 2014-01-10 DIAGNOSIS — Z79899 Other long term (current) drug therapy: Secondary | ICD-10-CM

## 2014-01-10 DIAGNOSIS — Z7982 Long term (current) use of aspirin: Secondary | ICD-10-CM

## 2014-01-10 LAB — CBG MONITORING, ED: Glucose-Capillary: 147 mg/dL — ABNORMAL HIGH (ref 70–99)

## 2014-01-10 MED ORDER — SULFAMETHOXAZOLE-TRIMETHOPRIM 800-160 MG PO TABS: 1.0000 | ORAL_TABLET | Freq: Two times a day (BID) | ORAL | Status: DC

## 2014-01-10 MED ORDER — CIPROFLOXACIN HCL 500 MG PO TABS: 500.0000 mg | ORAL_TABLET | Freq: Two times a day (BID) | ORAL | Status: DC

## 2014-01-10 NOTE — ED Notes (Signed)
Pt c/o diabetic sore to right lateral foot x 3 days. Pt also c/o nausea and difficulty concentrating.

## 2014-01-10 NOTE — Discharge Instructions (Signed)
Skin Ulcer A skin ulcer is an open sore that can be shallow or deep. Skin ulcers sometimes become infected and are difficult to treat. It may be 1 month or longer before real healing progress is made. CAUSES   Injury.  Problems with the veins or arteries.  Diabetes.  Insect bites.  Bedsores.  Inflammatory conditions. SYMPTOMS   Pain, redness, swelling, and tenderness around the ulcer.  Fever.  Bleeding from the ulcer.  Yellow or clear fluid coming from the ulcer. DIAGNOSIS  There are many types of skin ulcers. Any open sores will be examined. Certain tests will be done to determine the kind of ulcer you have. The right treatment depends on the type of ulcer you have. TREATMENT  Treatment is a long-term challenge. It may include:  Wearing an elastic wrap, compression stockings, or gel cast over the ulcer area.  Taking antibiotic medicines or putting antibiotic creams on the affected area if there is an infection. HOME CARE INSTRUCTIONS  Put on your bandages (dressings), wraps, or casts over the ulcer as directed by your caregiver.  Change all dressings as directed by your caregiver.  Take all medicines as directed by your caregiver.  Keep the affected area clean and dry.  Avoid injuries to the affected area.  Eat a well-balanced, healthy diet that includes plenty of fruit and vegetables.  If you smoke, consider quitting or decreasing the amount of cigarettes you smoke.  Once the ulcer heals, get regular exercise as directed by your caregiver.  Work with your caregiver to make sure your blood pressure, cholesterol, and diabetes are well-controlled.  Keep your skin moisturized. Dry skin can crack and lead to skin ulcers. SEEK IMMEDIATE MEDICAL CARE IF:   Your pain gets worse.  You have swelling, redness, or fluids around the ulcer.  You have chills.  You have a fever. MAKE SURE YOU:   Understand these instructions.  Will watch your condition.  Will get  help right away if you are not doing well or get worse. Document Released: 06/22/2004 Document Revised: 08/07/2011 Document Reviewed: 12/30/2010 [facility] Patient Information 2015 [place], [place]. This information is not intended to replace advice given to you by your health care provider. Make sure you discuss any questions you have with your health care provider.  Diabetes and Foot Care Diabetes may cause you to have problems because of poor blood supply (circulation) to your feet and legs. This may cause the skin on your feet to become thinner, break easier, and heal more slowly. Your skin may become dry, and the skin may peel and crack. You may also have nerve damage in your legs and feet causing decreased feeling in them. You may not notice minor injuries to your feet that could lead to infections or more serious problems. Taking care of your feet is one of the most important things you can do for yourself.  HOME CARE INSTRUCTIONS  Wear shoes at all times, even in the house. Do not go barefoot. Bare feet are easily injured.  Check your feet daily for blisters, cuts, and redness. If you cannot see the bottom of your feet, use a mirror or ask someone for help.  Wash your feet with warm water (do not use hot water) and mild soap. Then pat your feet and the areas between your toes until they are completely dry. Do not soak your feet as this can dry your skin.  Apply a moisturizing lotion or petroleum jelly (that does not contain alcohol and  is unscented) to the skin on your feet and to dry, brittle toenails. Do not apply lotion between your toes.  Trim your toenails straight across. Do not dig under them or around the cuticle. File the edges of your nails with an emery board or nail file.  Do not cut corns or calluses or try to remove them with medicine.  Wear clean socks or stockings every day. Make sure they are not too tight. Do not wear knee-high stockings since they may decrease blood flow  to your legs.  Wear shoes that fit properly and have enough cushioning. To break in new shoes, wear them for just a few hours a day. This prevents you from injuring your feet. Always look in your shoes before you put them on to be sure there are no objects inside.  Do not cross your legs. This may decrease the blood flow to your feet.  If you find a minor scrape, cut, or break in the skin on your feet, keep it and the skin around it clean and dry. These areas may be cleansed with mild soap and water. Do not cleanse the area with peroxide, alcohol, or iodine.  When you remove an adhesive bandage, be sure not to damage the skin around it.  If you have a wound, look at it several times a day to make sure it is healing.  Do not use heating pads or hot water bottles. They may burn your skin. If you have lost feeling in your feet or legs, you may not know it is happening until it is too late.  Make sure your health care provider performs a complete foot exam at least annually or more often if you have foot problems. Report any cuts, sores, or bruises to your health care provider immediately. SEEK MEDICAL CARE IF:   You have an injury that is not healing.  You have cuts or breaks in the skin.  You have an ingrown nail.  You notice redness on your legs or feet.  You feel burning or tingling in your legs or feet.  You have pain or cramps in your legs and feet.  Your legs or feet are numb.  Your feet always feel cold. SEEK IMMEDIATE MEDICAL CARE IF:   There is increasing redness, swelling, or pain in or around a wound.  There is a red line that goes up your leg.  Pus is coming from a wound.  You develop a fever or as directed by your health care provider.  You notice a bad smell coming from an ulcer or wound. Document Released: 05/12/2000 Document Revised: 01/15/2013 Document Reviewed: 10/22/2012 [facility] Patient Information 2015 [place], [place]. This information is not intended to  replace advice given to you by your health care provider. Make sure you discuss any questions you have with your health care provider.

## 2014-01-10 NOTE — ED Provider Notes (Signed)
CSN: [id number]     Arrival date & time 01/10/14  1250 History  This chart was scribed for Gilda Creasehristopher J. Pollina, * by Elon SpannerGarrett Cook, ED Scribe. This patient was seen in room APA07/APA07 and the patient's care was started at 1:22 PM.    Chief Complaint  Patient presents with  . Skin Ulcer   The history is provided by the patient. No language interpreter was used.    HPI Comments: [name] is a 58 y.o. female with a history of DM, HTN, osteomalitis who presents to the Emergency Department complaining of a a lesion on her left foot.  She states she first noticed swelling in the affected area 6 days ago but noticed the lesion 3 days ago.  She was seen by her PCP yesterday for this complaint and states it has worsened since this time.  She also notes that her blood sugar has been in the 190s recently and she was working with her PCP to try to get it under control.  .  Patient denies allergies to antibiotics.  Patient denies fever.    Patient also complains of nausea.  Podiatrist: Nolen MuMcKinney Past Medical History  Diagnosis Date  . Hypertension   . Anxiety   . Depression   . Dysrhythmia     hx of A Fib  . Diabetes mellitus     niddm  . Fibromyalgia   . Cancer     uterine  . Neuropathy   . Hyperlipidemia   . Atrial fibrillation   . Degenerative joint disease   . History of stress test 2007    NM stress test negative   Past Surgical History  Procedure Laterality Date  . Cholecystectomy  2007  . Tubal ligation  1978  . Abdominal hysterectomy  2000  . Thyroid cyst excision  1996    removal of nodule  . Knee arthroscopy      left knee-multiple  . Knee arthroscopy  2000    right knee  . Replacement total knee bilateral      2006  . Carpal tunnel release  2003    bilateral   . Hernia repair  2007    incisional hernia repair x2  . Amputation  04/11/2011    Procedure: AMPUTATION DIGIT;  Surgeon: Dallas SchimkeBenjamin Ivan McKinney;  Location: [facility];  Service: Orthopedics;   Laterality: Left;  Amputation Second Toe Left Foot  . Bone biopsy  04/11/2011    Procedure: BONE BIOPSY;  Surgeon: Dallas SchimkeBenjamin Ivan McKinney;  Location: [facility];  Service: Orthopedics;  Laterality: Left;  Bone Biopsy Left Foot Third Toe   . Colonoscopy    . 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 Anesthesia problems Neg Hx   . Hypotension Neg Hx   . Malignant hyperthermia Neg Hx   . Pseudochol deficiency Neg Hx   . Heart attack Brother     about age 58   History  Substance Use Topics  . Smoking status: Current Every Day Smoker -- 0.25 packs/day for 40 years    Types: Cigarettes  . Smokeless tobacco: Never Used  . Alcohol Use: No   OB History   Grav Para Term Preterm Abortions TAB SAB Ect Mult Living   3 3 3       2      Review of Systems  Gastrointestinal: Positive for nausea.  Skin: Positive for wound.  All other systems reviewed and are negative.  Allergies  Chantix; Codeine; and Statins  Home Medications   Prior to Admission medications   Medication Sig Start Date End Date Taking? Authorizing Provider  aspirin 81 MG chewable tablet Chew 1 tablet (81 mg total) by mouth daily. 02/03/13  Yes Standley Brooking, MD  Canagliflozin [facility]) 100 MG TABS Take 1 tablet by mouth daily.   Yes Historical Provider, MD  diltiazem (CARDIZEM) 120 MG tablet TAKE 1 TABLET BY MOUTH DAILY 12/11/13  Yes Babs Sciara, MD  HUMALOG KWIKPEN 100 UNIT/ML KiwkPen INJECT 10 UNITS UNDER THE SKIN THREE TIMES DAILY (NEEDS OFFICE VISIT) 12/12/13  Yes Babs Sciara, MD  ibuprofen (ADVIL,MOTRIN) 800 MG tablet Take 1 tablet (800 mg total) by mouth daily. 02/03/13  Yes Standley Brooking, MD  Insulin Glargine (LANTUS SOLOSTAR) 100 UNIT/ML Solostar Pen MAY TITRATE UP TO 40 UNITS NIGHTLY 10/17/13  Yes Babs Sciara, MD  lisinopril (PRINIVIL,ZESTRIL) 5 MG tablet TAKE 1/2 TABLET BY MOUTH ONCE DAILY 12/11/13  Yes Babs Sciara, MD  Multiple Vitamin (MULTIVITAMIN) tablet  Take 1 tablet by mouth daily.   Yes Historical Provider, MD  PARoxetine (PAXIL) 20 MG tablet TAKE 4 TABLETS BY MOUTH EVERY MORNING 12/11/13  Yes Babs Sciara, MD  pregabalin (LYRICA) 100 MG capsule Take 1 capsule (100 mg total) by mouth 2 (two) times daily. 01/09/14  Yes Babs Sciara, MD  traMADol (ULTRAM) 50 MG tablet TAKE 1 TABLET BY MOUTH QID PRN 01/09/14  Yes Babs Sciara, MD  Canagliflozin 300 MG TABS Take 1 tablet (300 mg total) by mouth every morning. 01/09/14   Babs Sciara, MD   BP 154/74  Pulse 87  Temp(Src) 99.3 F (37.4 C) (Oral)  Resp 20  Ht 5\' 10"  (1.778 m)  Wt 299 lb (135.626 kg)  BMI 42.90 kg/m2  SpO2 95% Physical Exam  Nursing note and vitals reviewed. Constitutional: She is oriented to person, place, and time. She appears well-developed and well-nourished. No distress.  HENT:  Head: Normocephalic and atraumatic.  Right Ear: Hearing normal.  Left Ear: Hearing normal.  Nose: Nose normal.  Mouth/Throat: Oropharynx is clear and moist and mucous membranes are normal.  Eyes: Conjunctivae and EOM are normal. Pupils are equal, round, and reactive to light.  Neck: Normal range of motion. Neck supple.  Cardiovascular: Regular rhythm, S1 normal and S2 normal.  Exam reveals no gallop and no friction rub.   No murmur heard. Pulmonary/Chest: Effort normal and breath sounds normal. No respiratory distress. She exhibits no tenderness.  Abdominal: Soft. Normal appearance and bowel sounds are normal. There is no hepatosplenomegaly. There is no tenderness. There is no rebound, no guarding, no tenderness at McBurney's point and negative Murphy's sign. No hernia.  Musculoskeletal: Normal range of motion.  Neurological: She is alert and oriented to person, place, and time. She has normal strength. No cranial nerve deficit or sensory deficit. Coordination normal. GCS eye subscore is 4. GCS verbal subscore is 5. GCS motor subscore is 6.  Skin: Skin is warm, dry and intact. No cyanosis  or erythema.  1 cm x 2 cm callous formation over the alater fifth MTP joint with associated tenderness, swelling and surrounding erythema.  No drainage.    Psychiatric: She has a normal mood and affect. Her speech is normal and behavior is normal. Thought content normal.    ED Course  Procedures (including critical care time)  DIAGNOSTIC STUDIES: Oxygen Saturation is 95% on RA, adequate by my interpretation.  COORDINATION OF CARE:    Labs Review Labs Reviewed - No data to display  Imaging Review No results found.   EKG Interpretation None      MDM   Final diagnoses:  None   diabetic foot ulcer  Patient presents to the year for evaluation of redness and swelling on the outside portion of the right foot. She reports that she does have a callus in the area, but in the last 3 days the area has become more swollen. Today it is red, but there is no drainage. Patient is concerned because she does have a history of diabetes, reports that her blood sugars are running in the 190s. She saw her doctor yesterday and has had some changes in her diabetes medications because of the slightly elevated sugars. Patient concerned because she does have a history of osteomyelitis in the other foot. X-ray today, however, does not show any evidence of osteomyelitis. There is no gas formation. Examination reveals swelling without fluctuance, no drainable fluid collections. Patient will be initiated on Cipro and Bactrim. She sees Doctor Nolen Mu, podiatry for ongoing care of her foot. She will see him in the office this week. Return for worsening symptoms.  I personally performed the services described in this documentation, which was scribed in my presence. The recorded information has been reviewed and is accurate.     Gilda Crease, MD 01/10/14 [id number]

## 2014-01-27 LAB — HM DIABETES EYE EXAM

## 2014-02-17 ENCOUNTER — Other Ambulatory Visit: Payer: Self-pay | Admitting: Family Medicine

## 2014-03-06 ENCOUNTER — Telehealth: Payer: Self-pay | Admitting: *Deleted

## 2014-03-06 MED ORDER — DILTIAZEM HCL 120 MG PO TABS: ORAL_TABLET | ORAL | Status: DC

## 2014-03-19 ENCOUNTER — Ambulatory Visit (INDEPENDENT_AMBULATORY_CARE_PROVIDER_SITE_OTHER): Payer: 59 | Admitting: Family Medicine

## 2014-03-19 ENCOUNTER — Other Ambulatory Visit: Payer: Self-pay | Admitting: Family Medicine

## 2014-03-19 ENCOUNTER — Encounter: Payer: Self-pay | Admitting: Family Medicine

## 2014-03-19 VITALS — BP 124/64 | Ht 70.0 in | Wt 299.0 lb

## 2014-03-19 DIAGNOSIS — I10 Essential (primary) hypertension: Secondary | ICD-10-CM

## 2014-03-19 DIAGNOSIS — E119 Type 2 diabetes mellitus without complications: Secondary | ICD-10-CM

## 2014-03-19 DIAGNOSIS — E1140 Type 2 diabetes mellitus with diabetic neuropathy, unspecified: Secondary | ICD-10-CM

## 2014-03-19 DIAGNOSIS — E785 Hyperlipidemia, unspecified: Secondary | ICD-10-CM

## 2014-03-19 DIAGNOSIS — M797 Fibromyalgia: Secondary | ICD-10-CM

## 2014-03-19 MED ORDER — LISINOPRIL 5 MG PO TABS: ORAL_TABLET | ORAL | Status: DC

## 2014-03-19 MED ORDER — INSULIN LISPRO 100 UNIT/ML (KWIKPEN): PEN_INJECTOR | SUBCUTANEOUS | Status: DC

## 2014-03-19 MED ORDER — INSULIN GLARGINE 100 UNIT/ML SOLOSTAR PEN: PEN_INJECTOR | SUBCUTANEOUS | Status: DC

## 2014-03-19 MED ORDER — CANAGLIFLOZIN 300 MG PO TABS: 300.0000 mg | ORAL_TABLET | ORAL | Status: DC

## 2014-03-19 MED ORDER — PAROXETINE HCL 20 MG PO TABS: ORAL_TABLET | ORAL | Status: DC

## 2014-03-19 MED ORDER — PREGABALIN 100 MG PO CAPS: 100.0000 mg | ORAL_CAPSULE | Freq: Two times a day (BID) | ORAL | Status: DC

## 2014-03-19 MED ORDER — DILTIAZEM HCL 120 MG PO TABS: ORAL_TABLET | ORAL | Status: DC

## 2014-03-19 NOTE — Progress Notes (Signed)
   Subjective:    Patient ID: [name], female    DOB: 08-Nov-1955, 58 y.o.   MRN: [id number]  Diabetes She presents for her follow-up diabetic visit. She has type 2 diabetes mellitus. There are no hypoglycemic associated symptoms. There are no diabetic associated symptoms. Symptoms are improving. Current diabetic treatment includes insulin injections and oral agent (monotherapy). She is compliant with treatment all of the time. She is currently taking insulin pre-breakfast, pre-lunch, pre-dinner and at bedtime. Insulin injections are given by patient. Her overall blood glucose range is 140-180 mg/dl.   Had wellness exam through work. A1C in September was 6.7 according to patient.  Had flu vaccine as well.   Pt states she feels better with the dose changes with the insulin.only occasional low sugar spell  Lyrica has helped with the numbness, no fatigue with it Patient has had diabetes since 2000 Pt states she has slight edema in her legs. She said this is a chronic condition and wanted to know if she needs to be on any fluid pills.   Needs refill on Paxil.   Review of Systems Relates increased urination with her diabetes medicines denies any chest tightness pressure pain shortness of breath.    Objective:   Physical Exam  Neck no masses lungs clear no crackles heart trigger pulse normal blood pressure good on recheck abdomen obese extremities no edema foot exam completed      Assessment & Plan:  Diabetes decent control continue current measures Depression stable continue current measures Neuropathy of the feet under decent control with Lyrica continue Blood pressure good control continue current measures Tramadol for arthralgias.  Followup 3-4 months 25 minutes spent with patient

## 2014-03-19 NOTE — Patient Instructions (Signed)
Smoking Cessation Quitting smoking is important to your health and has many advantages. However, it is not always easy to quit since nicotine is a very addictive drug. Oftentimes, people try 3 times or more before being able to quit. This document explains the best ways for you to prepare to quit smoking. Quitting takes hard work and a lot of effort, but you can do it. ADVANTAGES OF QUITTING SMOKING  You will live longer, feel better, and live better.  Your body will feel the impact of quitting smoking almost immediately.  Within 20 minutes, blood pressure decreases. Your pulse returns to its normal level.  After 8 hours, carbon monoxide levels in the blood return to normal. Your oxygen level increases.  After 24 hours, the chance of having a heart attack starts to decrease. Your breath, hair, and body stop smelling like smoke.  After 48 hours, damaged nerve endings begin to recover. Your sense of taste and smell improve.  After 72 hours, the body is virtually free of nicotine. Your bronchial tubes relax and breathing becomes easier.  After 2 to 12 weeks, lungs can hold more air. Exercise becomes easier and circulation improves.  The risk of having a heart attack, stroke, cancer, or lung disease is greatly reduced.  After 1 year, the risk of coronary heart disease is cut in half.  After 5 years, the risk of stroke falls to the same as a nonsmoker.  After 10 years, the risk of lung cancer is cut in half and the risk of other cancers decreases significantly.  After 15 years, the risk of coronary heart disease drops, usually to the level of a nonsmoker.  If you are pregnant, quitting smoking will improve your chances of having a healthy baby.  The people you live with, especially any children, will be healthier.  You will have extra money to spend on things other than cigarettes. QUESTIONS TO THINK ABOUT BEFORE ATTEMPTING TO QUIT You may want to talk about your answers with your  health care provider.  Why do you want to quit?  If you tried to quit in the past, what helped and what did not?  What will be the most difficult situations for you after you quit? How will you plan to handle them?  Who can help you through the tough times? Your family? Friends? A health care provider?  What pleasures do you get from smoking? What ways can you still get pleasure if you quit? Here are some questions to ask your health care provider:  How can you help me to be successful at quitting?  What medicine do you think would be best for me and how should I take it?  What should I do if I need more help?  What is smoking withdrawal like? How can I get information on withdrawal? GET READY  Set a quit date.  Change your environment by getting rid of all cigarettes, ashtrays, matches, and lighters in your home, car, or work. Do not let people smoke in your home.  Review your past attempts to quit. Think about what worked and what did not. GET SUPPORT AND ENCOURAGEMENT You have a better chance of being successful if you have help. You can get support in many ways.  Tell your family, friends, and coworkers that you are going to quit and need their support. Ask them not to smoke around you.  Get individual, group, or telephone counseling and support. Programs are available at local hospitals and health centers. Call   your local health department for information about programs in your area.  Spiritual beliefs and practices may help some smokers quit.  Download a "quit meter" on your computer to keep track of quit statistics, such as how long you have gone without smoking, cigarettes not smoked, and money saved.  Get a self-help book about quitting smoking and staying off tobacco. LEARN NEW SKILLS AND BEHAVIORS  Distract yourself from urges to smoke. Talk to someone, go for a walk, or occupy your time with a task.  Change your normal routine. Take a different route to work.  Drink tea instead of coffee. Eat breakfast in a different place.  Reduce your stress. Take a hot bath, exercise, or read a book.  Plan something enjoyable to do every day. Reward yourself for not smoking.  Explore interactive web-based programs that specialize in helping you quit. GET MEDICINE AND USE IT CORRECTLY Medicines can help you stop smoking and decrease the urge to smoke. Combining medicine with the above behavioral methods and support can greatly increase your chances of successfully quitting smoking.  Nicotine replacement therapy helps deliver nicotine to your body without the negative effects and risks of smoking. Nicotine replacement therapy includes nicotine gum, lozenges, inhalers, nasal sprays, and skin patches. Some may be available over-the-counter and others require a prescription.  Antidepressant medicine helps people abstain from smoking, but how this works is unknown. This medicine is available by prescription.  Nicotinic receptor partial agonist medicine simulates the effect of nicotine in your brain. This medicine is available by prescription. Ask your health care provider for advice about which medicines to use and how to use them based on your health history. Your health care provider will tell you what side effects to look out for if you choose to be on a medicine or therapy. Carefully read the information on the package. Do not use any other product containing nicotine while using a nicotine replacement product.  RELAPSE OR DIFFICULT SITUATIONS Most relapses occur within the first 3 months after quitting. Do not be discouraged if you start smoking again. Remember, most people try several times before finally quitting. You may have symptoms of withdrawal because your body is used to nicotine. You may crave cigarettes, be irritable, feel very hungry, cough often, get headaches, or have difficulty concentrating. The withdrawal symptoms are only temporary. They are strongest  when you first quit, but they will go away within 10-14 days. To reduce the chances of relapse, try to:  Avoid drinking alcohol. Drinking lowers your chances of successfully quitting.  Reduce the amount of caffeine you consume. Once you quit smoking, the amount of caffeine in your body increases and can give you symptoms, such as a rapid heartbeat, sweating, and anxiety.  Avoid smokers because they can make you want to smoke.  Do not let weight gain distract you. Many smokers will gain weight when they quit, usually less than 10 pounds. Eat a healthy diet and stay active. You can always lose the weight gained after you quit.  Find ways to improve your mood other than smoking. FOR MORE INFORMATION  www.smokefree.gov  Document Released: 05/09/2001 Document Revised: 09/29/2013 Document Reviewed: 08/24/2011 Exit[employer] Patient Information 2015 [employer]. This information is not intended to replace advice given to you by your health care provider. Make sure you discuss any questions you have with your health care provider.  

## 2014-03-30 ENCOUNTER — Encounter: Payer: Self-pay | Admitting: Family Medicine

## 2014-05-13 ENCOUNTER — Other Ambulatory Visit: Payer: Self-pay | Admitting: Family Medicine

## 2014-05-14 ENCOUNTER — Other Ambulatory Visit: Payer: Self-pay | Admitting: *Deleted

## 2014-07-16 ENCOUNTER — Telehealth: Payer: Self-pay | Admitting: Family Medicine

## 2014-07-16 NOTE — Telephone Encounter (Signed)
[name], please finish out podiatry form regarding her address and send it to the podiatrist. Also please send the patient a card letting her know I expect her to be seen in March for a diabetic check

## 2014-07-24 ENCOUNTER — Other Ambulatory Visit: Payer: Self-pay | Admitting: Family Medicine

## 2014-07-25 NOTE — Telephone Encounter (Signed)
May have one refill. Needs to schedule office visit with us. It is been since October since we have seen her for her chronic health problems.

## 2014-08-10 ENCOUNTER — Encounter: Payer: Self-pay | Admitting: Family Medicine

## 2014-08-10 ENCOUNTER — Telehealth: Payer: Self-pay | Admitting: *Deleted

## 2014-08-10 ENCOUNTER — Ambulatory Visit (INDEPENDENT_AMBULATORY_CARE_PROVIDER_SITE_OTHER): Payer: 59 | Admitting: Family Medicine

## 2014-08-10 ENCOUNTER — Ambulatory Visit (HOSPITAL_COMMUNITY)
Admission: RE | Admit: 2014-08-10 | Discharge: 2014-08-10 | Disposition: A | Discharge status: Home / Self Care | Payer: 59 | Source: Ambulatory Visit | Attending: Family Medicine | Admitting: Family Medicine

## 2014-08-10 VITALS — BP 128/82 | Ht 70.0 in | Wt 296.0 lb

## 2014-08-10 DIAGNOSIS — M25572 Pain in left ankle and joints of left foot: Secondary | ICD-10-CM

## 2014-08-10 DIAGNOSIS — M25472 Effusion, left ankle: Secondary | ICD-10-CM | POA: Insufficient documentation

## 2014-08-10 NOTE — Progress Notes (Signed)
   Subjective:    Patient ID: Michele NettlesCynthia D Mcguire, female    DOB: 09-Jan-1956, 59 y.o.   MRN: [id number]  Ankle Pain  The incident occurred 5 to 7 days ago. There was no injury mechanism. The pain is present in the left ankle. The quality of the pain is described as burning. The pain is moderate. The pain has been constant since onset. Associated symptoms include a loss of motion and muscle weakness. She reports no foreign bodies present. The symptoms are aggravated by movement. She has tried acetaminophen for the symptoms. The treatment provided mild relief.   Patient arrives with left ankle pain and weakness since Thursday. Patient recalls no injury. Denies fever redness  Review of Systems Ankle pain calf is normal    Objective:   Physical Exam There is no obvious swelling in the lower leg. There is significant tenderness along the lateral aspect of the ankle especially posterior and lateral and inferior. Ligaments appear stable with some pain with movement.       Assessment & Plan:  Because of the diabetes issue and neuropathy issue is possible the patient has developed a stress fracture and just not feeling at I would recommend x-rays. If this is negative then observation if not better over 10-14 days and consider orthopedic referral  Diabetic follow-up recommended It should be noted that x-ray did not show ankle fracture

## 2014-08-10 NOTE — Telephone Encounter (Signed)
Please give work excuse from march 12 - 20 and can return on march 21st. Pt would like to pick up today if possible.  call pt when ready. [phone]

## 2014-08-11 ENCOUNTER — Encounter: Payer: Self-pay | Admitting: Family Medicine

## 2014-09-07 ENCOUNTER — Ambulatory Visit (INDEPENDENT_AMBULATORY_CARE_PROVIDER_SITE_OTHER): Payer: 59 | Admitting: Family Medicine

## 2014-09-07 ENCOUNTER — Encounter: Payer: Self-pay | Admitting: Family Medicine

## 2014-09-07 VITALS — BP 110/68 | Ht 70.0 in | Wt 297.9 lb

## 2014-09-07 DIAGNOSIS — E119 Type 2 diabetes mellitus without complications: Secondary | ICD-10-CM | POA: Diagnosis not present

## 2014-09-07 DIAGNOSIS — R519 Headache, unspecified: Secondary | ICD-10-CM

## 2014-09-07 DIAGNOSIS — E1140 Type 2 diabetes mellitus with diabetic neuropathy, unspecified: Secondary | ICD-10-CM

## 2014-09-07 DIAGNOSIS — I10 Essential (primary) hypertension: Secondary | ICD-10-CM | POA: Diagnosis not present

## 2014-09-07 DIAGNOSIS — E785 Hyperlipidemia, unspecified: Secondary | ICD-10-CM | POA: Diagnosis not present

## 2014-09-07 DIAGNOSIS — G44021 Chronic cluster headache, intractable: Secondary | ICD-10-CM

## 2014-09-07 DIAGNOSIS — R51 Headache: Secondary | ICD-10-CM

## 2014-09-07 LAB — POCT GLYCOSYLATED HEMOGLOBIN (HGB A1C): HEMOGLOBIN A1C: 5.7

## 2014-09-07 MED ORDER — TRAMADOL HCL 50 MG PO TABS: 50.0000 mg | ORAL_TABLET | Freq: Four times a day (QID) | ORAL | Status: DC | PRN

## 2014-09-07 NOTE — Progress Notes (Signed)
Subjective:    Patient ID: [name], female    DOB: Apr 06, 1956, 59 y.o.   MRN: [id number]  Diabetes She presents for her follow-up diabetic visit. She has type 2 diabetes mellitus. Her disease course has been stable. Pertinent negatives for hypoglycemia include no confusion, headaches or seizures. Associated symptoms include fatigue. Pertinent negatives for diabetes include no blurred vision, no chest pain, no polydipsia, no polyphagia, no visual change and no weakness. Risk factors for coronary artery disease include diabetes mellitus, dyslipidemia, family history and obesity. She is compliant with treatment all of the time. She participates in exercise intermittently.  Hyperlipidemia This is a chronic problem. The current episode started more than 1 year ago. The problem is uncontrolled. Recent lipid tests were reviewed and are high. Pertinent negatives include no chest pain. Treatments tried: can not tolerate statins. The current treatment provides no improvement of lipids. Risk factors for coronary artery disease include diabetes mellitus, dyslipidemia and family history.  Headache  This is a chronic problem. The current episode started more than 1 year ago. The problem occurs daily. The problem has been rapidly worsening. The pain is located in the right unilateral region. The pain does not radiate. The pain quality is not similar to prior headaches. The quality of the pain is described as stabbing and sharp. The pain is at a severity of 9/10. The pain is severe. Associated symptoms include nausea. Pertinent negatives include no abdominal pain, blurred vision, coughing, scalp tenderness, seizures, visual change or weakness. Nothing aggravates the symptoms. She has tried NSAIDs for the symptoms. The treatment provided mild relief.   patient relates that the headaches over the past month is got worse is now waking her up at night several times over the past month somewhat nausea no double  vision no vomiting. Patient does relate to family members including her father with brain cancer in their later years    Review of Systems  Constitutional: Positive for fatigue. Negative for activity change and appetite change.  HENT: Negative for congestion.   Eyes: Negative for blurred vision.  Respiratory: Negative for cough.   Cardiovascular: Negative for chest pain.  Gastrointestinal: Positive for nausea. Negative for abdominal pain.  Endocrine: Negative for polydipsia and polyphagia.  Neurological: Negative for seizures, weakness and headaches.  Psychiatric/Behavioral: Negative for confusion.       Objective:   Physical Exam  Constitutional: She appears well-nourished. No distress.  Cardiovascular: Normal rate, regular rhythm and normal heart sounds.   No murmur heard. Pulmonary/Chest: Effort normal and breath sounds normal. No respiratory distress.  Musculoskeletal: She exhibits no edema.  Lymphadenopathy:    She has no cervical adenopathy.  Neurological: She is alert. She exhibits normal muscle tone.  Psychiatric: Her behavior is normal.  Vitals reviewed.         Assessment & Plan:  1. Diabetes mellitus without complication Diabetes under much better control continue current measures she denies any low sugar spells - POCT glycosylated hemoglobin (Hb A1C) - Basic metabolic panel - Lipid panel - Microalbumin, urine  2. Essential hypertension, benign Blood pressure under good control today continue current measures check lab work - Basic metabolic panel - Lipid panel  3. Diabetic neuropathy, painful Neuropathy doing better with Lyrica as well as keeping the A1c under better control  4. Intractable chronic cluster headache With the severe headache it is best for this patient to have MRI of the brain - MR Brain Wo Contrast  5. Nocturnal headaches Given that the  headaches are now waking her up at night this puts him into a red flag territory needs MRI the  brain to help rule out cancer. Patient also has a history of stroke in the past although I doubt that this is stroke - MR Brain Wo Contrast  6. Hyperlipidemia Patient does not tolerate statins but we will check lipid profile - Lipid panel  Chronic back pain and foot pain tramadol prescription given she denies abusing  35-40 minutes spent with patient going over multiple different issues and setting up test

## 2014-09-10 ENCOUNTER — Ambulatory Visit (HOSPITAL_COMMUNITY)
Admission: RE | Admit: 2014-09-10 | Discharge: 2014-09-10 | Disposition: A | Discharge status: Home / Self Care | Payer: 59 | Source: Ambulatory Visit | Attending: Family Medicine | Admitting: Family Medicine

## 2014-09-10 DIAGNOSIS — G44021 Chronic cluster headache, intractable: Secondary | ICD-10-CM | POA: Insufficient documentation

## 2014-09-21 ENCOUNTER — Other Ambulatory Visit: Payer: Self-pay | Admitting: *Deleted

## 2014-09-21 NOTE — Patient Outreach (Signed)
[facility]) Care Management   09/21/2014  [name] 1956-04-23 [id number]  [name] is an 59 y.o. female who presents for routine Link To Wellness follow up for self management assistance with Type II DM.  Subjective:  [name] says her blood sugars and A1C have significantly improved since she and her family began consistently following a CHO controlled meal plan and reducing the frequency of eating out. She does voice concern that she has unpredictable periods of "brain fog" started many years ago after the death of her son. She also says that her social circle is much smaller than in years past. She is wondering if these things could be related to her chronic depression or perhaps exposure to pesticides when she was a teenager. She continues to manage her chronic joint and fibromyalgia pain with Lyrica and Tramadol and voices good relief.  Objective:   Review of Systems  Musculoskeletal: Positive for joint pain.    Physical Exam  Constitutional: She is oriented to person, place, and time. She appears well-developed and well-nourished.  Neurological: She is alert and oriented to person, place, and time.  Psychiatric: She has a normal mood and affect. Her behavior is normal. Judgment and thought content normal.    Current Medications:   Current Outpatient Prescriptions  Medication Sig Dispense Refill  . aspirin 81 MG chewable tablet Chew 1 tablet (81 mg total) by mouth daily.    . Canagliflozin 300 MG TABS Take 1 tablet (300 mg total) by mouth every morning. 90 tablet 3  . diltiazem (CARDIZEM) 120 MG tablet TAKE 1 TABLET BY MOUTH DAILY 90 tablet 3  . ibuprofen (ADVIL,MOTRIN) 800 MG tablet Take 1 tablet (800 mg total) by mouth daily.    . Insulin Glargine (LANTUS SOLOSTAR) 100 UNIT/ML Solostar Pen 30 UNITS TWO TIMES A DAY. MAY TITRATE UP TO 80 UNITS NIGHTLY 45 mL 5  . insulin lispro (HUMALOG KWIKPEN) 100 UNIT/ML KiwkPen INJECT 20-25 UNITS WITH MEALS  15 mL 5  . lisinopril (PRINIVIL,ZESTRIL) 5 MG tablet TAKE 1/2 TABLET BY MOUTH ONCE DAILY 90 tablet 3  . LYRICA 100 MG capsule TAKE 1 CAPSULE BY MOUTH TWICE DAILY 180 capsule 1  . PARoxetine (PAXIL) 20 MG tablet TAKE 4 TABLETS BY MOUTH EVERY MORNING 360 tablet 3  . traMADol (ULTRAM) 50 MG tablet Take 1 tablet (50 mg total) by mouth 4 (four) times daily as needed. *Needs office visit for refills* 120 tablet 5  . Multiple Vitamin (MULTIVITAMIN) tablet Take 1 tablet by mouth daily.     No current facility-administered medications for this visit.    Functional Status:   In your present state of health, do you have any difficulty performing the following activities: 09/21/2014  Hearing? N  Vision? N  Difficulty concentrating or making decisions? Y  Walking or climbing stairs? N  Dressing or bathing? N  Doing errands, shopping? N    Fall/Depression Screening:    PHQ 2/9 Scores 09/21/2014 03/19/2014  PHQ - 2 Score 2 0  PHQ- 9 Score 9 -   [facility] CM Care Plan Problem One        Patient Outreach from 09/21/2014 in [facility] [employer]   Care Plan Problem One  Type II DM currently meeting A1C target of <7.0% as evidenced by A1C= 5.7% on 09/07/14   Care Plan for Problem One  Active   [facility] Long Term Goal Start Date  09/21/14   Interventions for Problem One Long Term Goal  Using  a picture representation, reviewed the 8 core pathophysiologic deficits in Type II diabetes, discussed physiology of diabetes as a chronic progressive disease with the initial problem of insulin resistance in the muscle, liver and fat cells and then increased loss of beta cell function over time resulting in decreased insulin production, discussed role of obesity, especially central obesity, on insulin resistance, reviewed patient medications, discussed DM medications of Invokana, humalog and lantus, including the mechanism of action, onset and duration of action, common side effects, dosages and dosing schedule, reinforced  importance of taking all medications as prescribed ,  discussed results of latest POC A1C, A1C goal, and correlation to estimated average glucose, congratulated [name] on the significant improvement  in her A1C,  reviewed approximate amount of CHOs to aim for at meals ( 30-45 gm ) and snacks (15 gms), encouraged her to continue following the CHO controlled meal plan with her husband and family,  discussed the role of stress on blood sugar, discussed strategies to manage stress such as exercise. hobbies, relaxation techniques, scheduling time away from work at regular intervals, getting adequate sleep. etc., since her depression screening score indicated a possible depressive disorder Michele AspCindy was provided information and it was suggested she contact the [facility]ing Program at [phone]- [phone] to supplement the current counseling she is receiving from her pastor, also advised her to contact Dr. Gerda DissLuking for depression medication evaluation if she feels her depression is worsening . Reviewed upcoming appointments with healthcare providers and arranged for Link To Wellness follow up.      Assessment:   Link To Wellness member currently meeting target A1C without reported episodes of hypoglycemia. Complaining of intermittent "brain fog" and requesting counseling options offered by [facility].   Plan:  RNCM to fax today's office visit note to Dr. Lilyan PuntScott Luking.  RNCM will meet quarterly and as needed with patient per Link To Wellness program guidelines to assist with Type II DM self-management and assess patient's progress toward mutually set goals.  Bary RichardJanet S. Natoria Archibald RN,CCM,CDE [facility] Care Management Coordinator Office Phone [phone] Office Fax [phone]

## 2014-10-06 ENCOUNTER — Other Ambulatory Visit: Payer: Self-pay | Admitting: Family Medicine

## 2014-10-08 ENCOUNTER — Encounter: Payer: Self-pay | Admitting: *Deleted

## 2014-10-28 ENCOUNTER — Ambulatory Visit (INDEPENDENT_AMBULATORY_CARE_PROVIDER_SITE_OTHER): Payer: 59 | Admitting: Family Medicine

## 2014-10-28 ENCOUNTER — Encounter: Payer: Self-pay | Admitting: Family Medicine

## 2014-10-28 VITALS — BP 134/78 | Ht 70.0 in | Wt 304.0 lb

## 2014-10-28 DIAGNOSIS — M545 Low back pain, unspecified: Secondary | ICD-10-CM

## 2014-10-28 MED ORDER — TRAMADOL HCL 50 MG PO TABS: ORAL_TABLET | ORAL | Status: DC

## 2014-10-28 NOTE — Progress Notes (Signed)
   Subjective:    Patient ID: [name], female    DOB: 1955-07-11, 59 y.o.   MRN: [id number]  Back Pain This is a new problem. Episode onset: 2 -3 weeks  The pain is present in the lumbar spine (hip pain). Worse during: worse in the am, worse when standing after sitting for awhil. Stiffness is present in the morning. Treatments tried: tramadol, ibuprofen. The treatment provided no relief.   Worse over the past 3 weeks Some pain at night stays in lower back radiates into both hips No injury Tried hot shower tramadol and ibuprofen Some pain off and on through the years Pain does not go down the legs. Review of Systems  Musculoskeletal: Positive for back pain.   pain radiates into the hips    Objective:   Physical Exam  Lungs clear heart regular subjective discomfort in the lower back negative straight leg raise decreased range of motion with rotation      Assessment & Plan:  Tramadol when necessary Occasional OTC NSAIDs Recheck 6 weeks if ongoing trouble may need MRI they need physical therapy No signs of surgical issues With progressive symptoms and early morning pain I believe x-rays are indicated.

## 2014-11-03 ENCOUNTER — Ambulatory Visit (HOSPITAL_COMMUNITY)
Admission: RE | Admit: 2014-11-03 | Discharge: 2014-11-03 | Disposition: A | Discharge status: Home / Self Care | Payer: 59 | Source: Ambulatory Visit | Attending: Family Medicine | Admitting: Family Medicine

## 2014-11-03 DIAGNOSIS — M545 Low back pain: Secondary | ICD-10-CM | POA: Insufficient documentation

## 2014-11-03 DIAGNOSIS — M25551 Pain in right hip: Secondary | ICD-10-CM | POA: Diagnosis not present

## 2014-11-03 DIAGNOSIS — M25552 Pain in left hip: Secondary | ICD-10-CM | POA: Insufficient documentation

## 2014-11-03 DIAGNOSIS — M5134 Other intervertebral disc degeneration, thoracic region: Secondary | ICD-10-CM | POA: Diagnosis not present

## 2014-11-03 DIAGNOSIS — M5136 Other intervertebral disc degeneration, lumbar region: Secondary | ICD-10-CM | POA: Diagnosis not present

## 2014-11-23 ENCOUNTER — Other Ambulatory Visit: Payer: Self-pay | Admitting: *Deleted

## 2014-11-23 ENCOUNTER — Other Ambulatory Visit: Payer: Self-pay | Admitting: Nurse Practitioner

## 2014-11-23 MED ORDER — PREGABALIN 100 MG PO CAPS: 100.0000 mg | ORAL_CAPSULE | Freq: Two times a day (BID) | ORAL | Status: DC

## 2014-12-07 ENCOUNTER — Ambulatory Visit: Payer: 59 | Admitting: Family Medicine

## 2015-01-04 ENCOUNTER — Other Ambulatory Visit: Payer: Self-pay | Admitting: *Deleted

## 2015-01-04 VITALS — BP 98/60

## 2015-01-04 DIAGNOSIS — E11618 Type 2 diabetes mellitus with other diabetic arthropathy: Secondary | ICD-10-CM

## 2015-01-04 LAB — POCT GLYCOSYLATED HEMOGLOBIN (HGB A1C): HEMOGLOBIN A1C: 6.7

## 2015-01-04 NOTE — Patient Outreach (Signed)
[place] [facility]) Care Management   01/04/2015  [name] Aug 21, 1955 [id number]  [name] is an 60 y.o. female who presents for routine Link To Wellness follow up for self management assistance with Type II DM.  Subjective:  [name] says she saw Dr. Wolfgang Phoenix on 10/28/14 for complaints of lumbar and hip pain. 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 She says the pain has improved and she takes Ibuprofen when it is bothersome. She says she and her husband "fell off the wagon" and are no longer counting CHOs, due in part to vacation and her husband's struggles with depression. She is requesting a POC A1C today and says she is determined to get her A1C under 6.0% again by the next assessment.  Objective:   Review of Systems  Constitutional: Negative.     Physical Exam  Constitutional: She is oriented to person, place, and time. She appears well-developed and well-nourished.  Neurological: She is alert and oriented to person, place, and time.  Skin: Skin is warm and dry.  Psychiatric: She has a normal mood and affect. Her behavior is normal. Judgment and thought content normal.   Filed Vitals:   01/04/15 1309  BP: 98/60  POC A1C= 6.7%  Current Medications:   Current Outpatient Prescriptions  Medication Sig Dispense Refill  . aspirin 81 MG chewable tablet Chew 1 tablet (81 mg total) by mouth daily.    . Canagliflozin 300 MG TABS Take 1 tablet (300 mg total) by mouth every morning. 90 tablet 3  . diltiazem (CARDIZEM) 120 MG tablet TAKE 1 TABLET BY MOUTH DAILY 90 tablet 3  . HUMALOG KWIKPEN 100 UNIT/ML KiwkPen INJECT 20-25 UNITS WITH MEALS 15 mL 5  . ibuprofen (ADVIL,MOTRIN) 800 MG tablet Take 1 tablet (800 mg total) by mouth daily.    . Insulin Glargine (LANTUS SOLOSTAR) 100 UNIT/ML Solostar Pen 30 UNITS TWO TIMES A DAY. MAY TITRATE UP TO 80 UNITS NIGHTLY 45 mL 5  . lisinopril (PRINIVIL,ZESTRIL) 5 MG  tablet TAKE 1/2 TABLET BY MOUTH ONCE DAILY 90 tablet 3  . Multiple Vitamin (MULTIVITAMIN) tablet Take 1 tablet by mouth daily.    Marland Kitchen PARoxetine (PAXIL) 20 MG tablet TAKE 4 TABLETS BY MOUTH EVERY MORNING 360 tablet 3  . pregabalin (LYRICA) 100 MG capsule Take 1 capsule (100 mg total) by mouth 2 (two) times daily. 180 capsule 0  . traMADol (ULTRAM) 50 MG tablet May take 2 q 8 hours prn pain 180 tablet 5   No current facility-administered medications for this visit.    Functional Status:   In your present state of health, do you have any difficulty performing the following activities: 09/21/2014  Hearing? N  Vision? N  Difficulty concentrating or making decisions? Y  Walking or climbing stairs? N  Dressing or bathing? N  Doing errands, shopping? N    Fall/Depression Screening:    PHQ 2/9 Scores 09/21/2014 03/19/2014  PHQ - 2 Score 2 0  PHQ- 9 Score 9 -   [facility] CM Care Plan Problem One        Patient Outreach from 01/04/2015 in [place] Problem One  Type II DM currently meeting A1C target of <7.0% as evidenced by POC A1C= 6.7% on 01/04/15, but reflecting a 1% increase    Care Plan for Problem One  Active   [facility] Long Term Goal (31-90 days)  Ongoing good glycemic control as  evidenced by A1C<7.0% with no reported episodes of blood sugar <60 at/by next link To Wellness visit   [place] Goal Start Date  09/21/14   [facility] Long Term Goal Met Date  01/03/14   Interventions for Problem One Long Term Goal  Reviewed basic pathophysiologic deficits in Type II diabetes,reviewed patient medications, discussed DM medications of Invokana, humalog and lantus,  reinforced importance of taking all medications as prescribed , discussed results of today's  POC A1C, A1C goal, and correlation to estimated average glucose, discussed strategies to improve glycemic control, reviewed approximate amount of CHOs to aim for at meals ( 30-45 gm ) and snacks (15 gms), encouraged her to resume the CHO  controlled meal plan with her husband and family, reviewed upcoming appointments with healthcare providers, will arrange for  Link To Wellness follow up in December.      Assessment:   [employer] employee and Link To Wellness member with Type II DM meeting treatment A1C target but with 1% increase in A1C in last 3 months.  Plan:  RNCM to fax today's office visit note to Dr. Sallee Lange. RNCM will meet quarterly and as needed with patient per Link To Wellness program guidelines to assist with Type II DM self-management and assess patient's progress toward mutually set goals.  Barrington Ellison RN,CCM,CDE [place] Management Coordinator Link To Wellness Office Phone [phone] Office Fax [phone]

## 2015-02-23 ENCOUNTER — Other Ambulatory Visit: Payer: Self-pay | Admitting: *Deleted

## 2015-02-23 MED ORDER — PREGABALIN 100 MG PO CAPS: 100.0000 mg | ORAL_CAPSULE | Freq: Two times a day (BID) | ORAL | Status: DC

## 2015-03-17 ENCOUNTER — Other Ambulatory Visit: Payer: Self-pay | Admitting: Family Medicine

## 2015-03-18 NOTE — Telephone Encounter (Signed)
May refill 4 

## 2015-04-05 ENCOUNTER — Ambulatory Visit (INDEPENDENT_AMBULATORY_CARE_PROVIDER_SITE_OTHER): Payer: 59 | Admitting: Family Medicine

## 2015-04-05 ENCOUNTER — Encounter: Payer: Self-pay | Admitting: Family Medicine

## 2015-04-05 VITALS — BP 138/72 | Ht 67.75 in | Wt 315.0 lb

## 2015-04-05 DIAGNOSIS — I10 Essential (primary) hypertension: Secondary | ICD-10-CM

## 2015-04-05 DIAGNOSIS — R5383 Other fatigue: Secondary | ICD-10-CM | POA: Diagnosis not present

## 2015-04-05 DIAGNOSIS — E11610 Type 2 diabetes mellitus with diabetic neuropathic arthropathy: Secondary | ICD-10-CM

## 2015-04-05 DIAGNOSIS — Z0000 Encounter for general adult medical examination without abnormal findings: Secondary | ICD-10-CM

## 2015-04-05 DIAGNOSIS — K432 Incisional hernia without obstruction or gangrene: Secondary | ICD-10-CM | POA: Diagnosis not present

## 2015-04-05 DIAGNOSIS — Z79899 Other long term (current) drug therapy: Secondary | ICD-10-CM | POA: Diagnosis not present

## 2015-04-05 DIAGNOSIS — E1140 Type 2 diabetes mellitus with diabetic neuropathy, unspecified: Secondary | ICD-10-CM

## 2015-04-05 DIAGNOSIS — R413 Other amnesia: Secondary | ICD-10-CM

## 2015-04-05 DIAGNOSIS — I6529 Occlusion and stenosis of unspecified carotid artery: Secondary | ICD-10-CM | POA: Diagnosis not present

## 2015-04-05 DIAGNOSIS — E785 Hyperlipidemia, unspecified: Secondary | ICD-10-CM | POA: Diagnosis not present

## 2015-04-05 DIAGNOSIS — E119 Type 2 diabetes mellitus without complications: Secondary | ICD-10-CM

## 2015-04-05 NOTE — Progress Notes (Signed)
Subjective:    Patient ID: [name], female    DOB: 17-Feb-1956, 59 y.o.   MRN: [id number]  HPI The patient comes in today for a wellness visit.  Colonoscopy 5 years ago.   A review of their health history was completed.  A review of medications was also completed.  Any needed refills; none  Eating habits: heatlh conscious  Falls/  MVA accidents in past few months: none  Regular exercise: not enough, busy at work  Specialist pt sees on regular basis: dr Nolen Mumckinney-  Preventative health issues were discussed.   patient comes in for wellness. She also has multiple other issues that are worrying her and she requests that those issues be handled today as well. She relates she does try to be safe with her activity. She denies any falls. She does have some intermittent depression for which she takes medicine and she does do some discussions with friends but she does not one to do counseling. She states she is tried that before multiple times in the did not help. She denies hot all accidents. She states she stays busy at work but does not exercise. She states her appetite health could be better with a better dietary measures. She knows what she should eat she doesn't always follow that. She is up-to-date on colonoscopy.  Additional concerns: depression -She denies being suicidal. At times she worries about her long-term health, sore on right foot- this occurred after it rubbed on a brace she thinks she probably needs to go back to see her podiatrist she has a history of amputation  Referral to neuro and surgical for abdominal hernia- patient has a history of reoccurring troubles with ventral hernia needs referral to general surgeon and trouble with memory and speech.  She is had difficulty with memory and speech over the past few months at times has an idea of what she needs to say but she cannot think of the right word to say if. Other times she has difficulty saying the word that she is  thinking of. She is also had times of difficulty of speech where her speech is not well controlled. Occasionally gets unilateral numbness or weakness. Patient does take 81 mg aspirin. Has known  Carotid artery disease. In addition to this has risk factors for stroke.  2001 Hysterewctomy  Review of Systems  Constitutional: Negative for activity change, appetite change and fatigue.  HENT: Negative for congestion, ear discharge and rhinorrhea.   Eyes: Negative for discharge.  Respiratory: Negative for cough, chest tightness and wheezing.   Cardiovascular: Negative for chest pain.  Gastrointestinal: Positive for abdominal pain ( patient with pain from the hernia no vomiting). Negative for vomiting.  Genitourinary: Negative for frequency and difficulty urinating.  Musculoskeletal: Negative for neck pain.  Allergic/Immunologic: Negative for environmental allergies and food allergies.  Neurological: Negative for weakness and headaches.  Psychiatric/Behavioral: Negative for behavioral problems and agitation.       Objective:   Physical Exam  Constitutional: She is oriented to person, place, and time. She appears well-developed and well-nourished.  HENT:  Head: Normocephalic.  Right Ear: External ear normal.  Left Ear: External ear normal.  Eyes: Pupils are equal, round, and reactive to light.  Neck: Normal range of motion. No thyromegaly present.  Cardiovascular: Normal rate, regular rhythm, normal heart sounds and intact distal pulses.   No murmur heard. Pulmonary/Chest: Effort normal and breath sounds normal. No respiratory distress. She has no wheezes.  Abdominal: Soft. Bowel sounds  are normal. She exhibits no distension and no mass. There is no tenderness.  Ventral hernia noted no obstruction noted  Genitourinary:  Vaginal exam normal no masses felt no abnormalities seen. Patient has had hysterectomy.  Musculoskeletal: Normal range of motion. She exhibits no edema or tenderness.    Lymphadenopathy:    She has no cervical adenopathy.  Neurological: She is alert and oriented to person, place, and time. She exhibits normal muscle tone.  Skin: Skin is warm and dry.  Psychiatric: She has a normal mood and affect. Her behavior is normal.          Assessment & Plan:  1. Routine general medical examination at a health care facility Wellness safety measures dietary discussed. Patient not due for colonoscopy yet.mammogram recommended. Pap smear not indicated had hysterectomy for uterine cancer approximately 15 years ago - Lipid panel - Hepatic function panel - Hemoglobin A1c - Basic metabolic panel - Microalbumin / creatinine urine ratio - TSH - Sedimentation rate - Ambulatory referral to General Surgery - Ambulatory referral to Neurology - MR Brain Wo Contrast - MR MRA Head/Brain Wo Cm  2. Essential hypertension, benign Blood pressure issueswatch diet take medication. - Lipid panel - Hepatic function panel - Hemoglobin A1c - Basic metabolic panel - Microalbumin / creatinine urine ratio - TSH - Sedimentation rate - Ambulatory referral to General Surgery - Ambulatory referral to Neurology - MR Brain Wo Contrast - MR MRA Head/Brain Wo Cm  3. Type 2 diabetes mellitus not at goal [facility]) The importance keeping diabetes under control was discussed continue medication watch diet closely check A1c - Lipid panel - Hepatic function panel - Hemoglobin A1c - Basic metabolic panel - Microalbumin / creatinine urine ratio - TSH - Sedimentation rate - Ambulatory referral to General Surgery - Ambulatory referral to Neurology - MR Brain Wo Contrast - MR MRA Head/Brain Wo Cm  4. Charcot foot due to diabetes mellitus (HCC) Patient with severe foot issues diabetic shoes recommended. Patient is encouraged to go see her podiatrist cut she does have a sore on her foot from neuropathy in poor fitting brace. - Lipid panel - Hepatic function panel - Hemoglobin A1c -  Basic metabolic panel - Microalbumin / creatinine urine ratio - TSH - Sedimentation rate - Ambulatory referral to General Surgery - Ambulatory referral to Neurology - MR Brain Wo Contrast - MR MRA Head/Brain Wo Cm  5. Hyperlipemia Patient is encouraged to continue medication watch diet check lipid profile may need statin - Lipid panel - Hepatic function panel - Hemoglobin A1c - Basic metabolic panel - Microalbumin / creatinine urine ratio - TSH - Sedimentation rate - Ambulatory referral to General Surgery - Ambulatory referral to Neurology - MR Brain Wo Contrast - MR MRA Head/Brain Wo Cm  6. Morbid obesity, unspecified obesity type (HCC) Patient encouraged to lose weight watch diet stay active - Lipid panel - Hepatic function panel - Hemoglobin A1c - Basic metabolic panel - Microalbumin / creatinine urine ratio - TSH - Sedimentation rate - Ambulatory referral to General Surgery - Ambulatory referral to Neurology - MR Brain Wo Contrast - MR MRA Head/Brain Wo Cm  7. Carotid stenosis, unspecified laterality This patient has a history of carotid stenosis. MRA will help checked this. See discussion below - Lipid panel - Hepatic function panel - Hemoglobin A1c - Basic metabolic panel - Microalbumin / creatinine urine ratio - TSH - Sedimentation rate - Ambulatory referral to General Surgery - Ambulatory referral to Neurology - MR Brain Wo  Contrast - MR MRA Head/Brain Wo Cm  8. Recurrent ventral hernia Ventral hernia needs referral to general surgery she prefers [employer] surgery patient somewhat complex more than likely will need mesh repair - Lipid panel - Hepatic function panel - Hemoglobin A1c - Basic metabolic panel - Microalbumin / creatinine urine ratio - TSH - Sedimentation rate - Ambulatory referral to General Surgery - Ambulatory referral to Neurology - MR Brain Wo Contrast - MR MRA Head/Brain Wo Cm  9. Memory dysfunction This patient is  having difficult time remembering words she is also having difficult time concentrating thinking she's having some spells where she loses focus she is also having spells of difficulty speaking and swallowing as well as some signs weakness unilateral. It's worrisome for the possibility of mini strokes. She does have a lot of risk factors. We are going to do MRI of her brain as well as MRA and referral to neurology - Lipid panel - Hepatic function panel - Hemoglobin A1c - Basic metabolic panel - Microalbumin / creatinine urine ratio - TSH - Sedimentation rate - Ambulatory referral to General Surgery - Ambulatory referral to Neurology - MR Brain Wo Contrast - MR MRA Head/Brain Wo Cm  10. Other fatigue Significant fatigue tiredness could be related to her overall health check TSH - Lipid panel - Hepatic function panel - Hemoglobin A1c - Basic metabolic panel - Microalbumin / creatinine urine ratio - TSH - Sedimentation rate - Ambulatory referral to General Surgery - Ambulatory referral to Neurology - MR Brain Wo Contrast - MR MRA Head/Brain Wo Cm  11. High risk medication use Check liver function and metabolic 7 - Lipid panel - Hepatic function panel - Hemoglobin A1c - Basic metabolic panel - Microalbumin / creatinine urine ratio - TSH - Sedimentation rate - Ambulatory referral to General Surgery - Ambulatory referral to Neurology - MR Brain Wo Contrast - MR MRA Head/Brain Wo Cm 25 minutes was spent with the patient. Greater than half the time was spent in discussion and answering questions and counseling regarding the issues that the patient came in for today. This was in addition to her wellness exam

## 2015-04-06 ENCOUNTER — Encounter: Payer: Self-pay | Admitting: Family Medicine

## 2015-04-10 LAB — HEPATIC FUNCTION PANEL
ALT: 26 IU/L (ref 0–32)
AST: 16 IU/L (ref 0–40)
Albumin: 4.3 g/dL (ref 3.5–5.5)
Alkaline Phosphatase: 92 IU/L (ref 39–117)
BILIRUBIN, DIRECT: 0.07 mg/dL (ref 0.00–0.40)
Bilirubin Total: 0.3 mg/dL (ref 0.0–1.2)
Total Protein: 6.9 g/dL (ref 6.0–8.5)

## 2015-04-10 LAB — MICROALBUMIN / CREATININE URINE RATIO
Creatinine, Urine: 67 mg/dL
MICROALB/CREAT RATIO: 112.8 mg/g creat — ABNORMAL HIGH (ref 0.0–30.0)
MICROALBUM., U, RANDOM: 75.6 ug/mL

## 2015-04-10 LAB — BASIC METABOLIC PANEL
BUN / CREAT RATIO: 23 (ref 9–23)
BUN: 15 mg/dL (ref 6–24)
CO2: 24 mmol/L (ref 18–29)
CREATININE: 0.66 mg/dL (ref 0.57–1.00)
Calcium: 10.3 mg/dL — ABNORMAL HIGH (ref 8.7–10.2)
Chloride: 104 mmol/L (ref 97–106)
GFR, EST AFRICAN AMERICAN: 112 mL/min/{1.73_m2} (ref >59)
GFR, EST NON AFRICAN AMERICAN: 97 mL/min/{1.73_m2} (ref >59)
Glucose: 110 mg/dL — ABNORMAL HIGH (ref 65–99)
POTASSIUM: 4.5 mmol/L (ref 3.5–5.2)
SODIUM: 141 mmol/L (ref 136–144)

## 2015-04-10 LAB — LIPID PANEL
CHOL/HDL RATIO: 4.3 ratio (ref 0.0–4.4)
CHOLESTEROL TOTAL: 226 mg/dL — AB (ref 100–199)
HDL: 52 mg/dL (ref >39)
LDL Calculated: 145 mg/dL — ABNORMAL HIGH (ref 0–99)
TRIGLYCERIDES: 143 mg/dL (ref 0–149)
VLDL Cholesterol Cal: 29 mg/dL (ref 5–40)

## 2015-04-10 LAB — TSH: TSH: 1.23 u[IU]/mL (ref 0.450–4.500)

## 2015-04-10 LAB — SEDIMENTATION RATE: Sed Rate: 7 mm/hr (ref 0–40)

## 2015-04-10 LAB — HEMOGLOBIN A1C
Est. average glucose Bld gHb Est-mCnc: 146 mg/dL
HEMOGLOBIN A1C: 6.7 % — AB (ref 4.8–5.6)

## 2015-04-11 ENCOUNTER — Encounter: Payer: Self-pay | Admitting: Family Medicine

## 2015-04-26 ENCOUNTER — Ambulatory Visit (HOSPITAL_COMMUNITY)
Admission: RE | Admit: 2015-04-26 | Discharge: 2015-04-26 | Disposition: A | Discharge status: Home / Self Care | Payer: 59 | Source: Ambulatory Visit | Attending: Family Medicine | Admitting: Family Medicine

## 2015-04-26 ENCOUNTER — Ambulatory Visit (HOSPITAL_COMMUNITY): Admission: RE | Admit: 2015-04-26 | Payer: 59 | Source: Ambulatory Visit

## 2015-04-26 DIAGNOSIS — I10 Essential (primary) hypertension: Secondary | ICD-10-CM | POA: Insufficient documentation

## 2015-04-26 DIAGNOSIS — E11610 Type 2 diabetes mellitus with diabetic neuropathic arthropathy: Secondary | ICD-10-CM | POA: Diagnosis not present

## 2015-04-26 DIAGNOSIS — R5383 Other fatigue: Secondary | ICD-10-CM | POA: Diagnosis not present

## 2015-04-26 DIAGNOSIS — R413 Other amnesia: Secondary | ICD-10-CM | POA: Diagnosis not present

## 2015-04-26 DIAGNOSIS — I739 Peripheral vascular disease, unspecified: Secondary | ICD-10-CM | POA: Diagnosis not present

## 2015-04-26 DIAGNOSIS — E119 Type 2 diabetes mellitus without complications: Secondary | ICD-10-CM | POA: Insufficient documentation

## 2015-04-26 DIAGNOSIS — K432 Incisional hernia without obstruction or gangrene: Secondary | ICD-10-CM | POA: Insufficient documentation

## 2015-04-26 DIAGNOSIS — I6529 Occlusion and stenosis of unspecified carotid artery: Secondary | ICD-10-CM | POA: Diagnosis not present

## 2015-04-26 DIAGNOSIS — E785 Hyperlipidemia, unspecified: Secondary | ICD-10-CM | POA: Diagnosis not present

## 2015-04-26 DIAGNOSIS — I679 Cerebrovascular disease, unspecified: Secondary | ICD-10-CM

## 2015-04-26 DIAGNOSIS — Z0000 Encounter for general adult medical examination without abnormal findings: Principal | ICD-10-CM

## 2015-04-26 DIAGNOSIS — Z79899 Other long term (current) drug therapy: Secondary | ICD-10-CM

## 2015-04-26 DIAGNOSIS — I6509 Occlusion and stenosis of unspecified vertebral artery: Secondary | ICD-10-CM

## 2015-04-26 HISTORY — DX: Cerebrovascular disease, unspecified: I67.9

## 2015-04-28 ENCOUNTER — Other Ambulatory Visit: Payer: Self-pay | Admitting: Family Medicine

## 2015-04-28 NOTE — Telephone Encounter (Signed)
May refill this +5 additional refills 

## 2015-04-29 ENCOUNTER — Telehealth: Payer: Self-pay | Admitting: Neurology

## 2015-04-29 ENCOUNTER — Encounter: Payer: Self-pay | Admitting: Neurology

## 2015-04-29 ENCOUNTER — Ambulatory Visit (INDEPENDENT_AMBULATORY_CARE_PROVIDER_SITE_OTHER): Payer: 59 | Admitting: Neurology

## 2015-04-29 VITALS — BP 141/66 | HR 56 | Ht 70.0 in | Wt 317.0 lb

## 2015-04-29 DIAGNOSIS — I6529 Occlusion and stenosis of unspecified carotid artery: Secondary | ICD-10-CM

## 2015-04-29 DIAGNOSIS — G4710 Hypersomnia, unspecified: Secondary | ICD-10-CM | POA: Diagnosis not present

## 2015-04-29 DIAGNOSIS — R413 Other amnesia: Secondary | ICD-10-CM | POA: Insufficient documentation

## 2015-04-29 DIAGNOSIS — E119 Type 2 diabetes mellitus without complications: Secondary | ICD-10-CM

## 2015-04-29 MED ORDER — ARIPIPRAZOLE 5 MG PO TABS: 5.0000 mg | ORAL_TABLET | Freq: Every day | ORAL | Status: DC

## 2015-04-29 NOTE — Telephone Encounter (Signed)
Please change US CAROTID BILATERAL order# to [id number]. This needs to be updated before I can schedule the procedure. Please advise once is it changed. Thanks!

## 2015-04-29 NOTE — Progress Notes (Signed)
PATIENT: Michele NettlesCynthia D [name] DOB: 1955/09/05  Chief Complaint  Patient presents with  . Headache    Patient c/o having headaches for a few years now. She also c/o "zoning out" at times. She states she cannot control when she will cry. She feels like she is not very decicive anymore. She feels like she misss things when she is driving. She cannot multitask anymore. She c/o numbness and weakness in her hands and feet.    . trouble concentrating     HISTORICAL  [name] is a 59 years old left-handed female alone at today's clinical visit, seen in refer by  Babs SciaraScott A Luking in December first 2016 for evaluation of constellation of complaints, includes headaches, confusion, speech difficulty, memory loss, abnormal MRIs  She had a history of hypertension, uterine cancer around 2000, status post hysterectomy, previous history of atrial fibrillation, now taking Cardizem with good rate control, insulin-dependent diabetes for more than a decade, diabetic complications peripheral neuropathy, toe amputations, she works as an Insurance underwriterCU nurse at [employer],  She used to be very active, multitasking, with the head nurse of her units, since 2000, she noticed gradual onset memory trouble, frequent headaches, gradually worse over the past decade, she has word finding difficulties, still able to work full-time, but only able to manage her basic job requirement, has trouble naming things, difficulty with multitasking, lack of attention, difficulty focusing.  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 Moderate stenoses of the proximal left M2 branches, more prominent in the inferior and posterior branch. Moderate diffuse distal small vessel disease   Ultrasound of carotid artery in 2014: Right internal carotid artery 50-69% stenosis,atherosclerotic plaque burden with slightly decreased elevated peak systolic velocities  She also complains of excessive daytime sleepiness, fatigue, today's ESS score is 13 she does snore,  IEW OF SYSTEMS: Full 14 system review of systems performed and notable only for weight gain, fatigue, swelling in legs, trouble swallowing, eye pain, shortness of breath, incontinence, cramps, achy muscles, memory loss, confusion, headaches, numbness, weakness, difficulty swallowing, sleepiness, depression anxiety into much sleep, decreased energy disinteresting activities  ALLERGIES: Allergies  Allergen Reactions  . Chantix [Varenicline Tartrate] Other (See Comments)    psychotic episodes  . Codeine Nausea Only  . Statins Other (See Comments)    Muscle weakness    HOME MEDICATIONS: Current Outpatient Prescriptions  Medication Sig Dispense Refill  . aspirin 81 MG chewable tablet Chew 1 tablet (81 mg total) by mouth daily.    . Canagliflozin 300 MG TABS Take 1 tablet (300 mg total) by mouth every morning. 90 tablet 3  . diltiazem (CARDIZEM) 120 MG tablet TAKE 1 TABLET BY MOUTH DAILY 90 tablet 5  . HUMALOG KWIKPEN 100 UNIT/ML KiwkPen INJECT 20-25 UNITS WITH MEALS 15 mL 5  . ibuprofen (ADVIL,MOTRIN) 800 MG tablet Take 1 tablet (800 mg total) by mouth daily.    . Insulin Glargine (LANTUS SOLOSTAR) 100 UNIT/ML Solostar Pen 30 UNITS TWO TIMES A DAY. MAY TITRATE UP TO 80 UNITS NIGHTLY 45 mL 5  . lisinopril (PRINIVIL,ZESTRIL) 5 MG  tablet TAKE 1/2 TABLET BY MOUTH ONCE DAILY 90 tablet 3  . Multiple Vitamin (MULTIVITAMIN) tablet Take 1 tablet by mouth daily.    Marland Kitchen PARoxetine (PAXIL) 20 MG tablet TAKE 4 TABLETS BY MOUTH EVERY MORNING 360 tablet 4    . pregabalin (LYRICA) 100 MG capsule Take 1 capsule (100 mg total) by mouth 2 (two) times daily. 180 capsule 0  . traMADol (ULTRAM) 50 MG tablet TAKE 2 TABLETS BY MOUTH EVERY 8 HOURS AS NEEDED FOR PAIN 180 tablet 5   No current facility-administered medications for this visit.    PAST MEDICAL HISTORY: Past Medical History  Diagnosis Date  . Hypertension   . Anxiety   . Depression   . Dysrhythmia     hx of A Fib  . Diabetes mellitus     niddm  . Fibromyalgia   . Cancer (HCC)     uterine  . Neuropathy (HCC)   . Hyperlipidemia   . Atrial fibrillation (HCC)   . Degenerative joint disease   . History of stress test 2007    NM stress test negative    PAST SURGICAL HISTORY: Past Surgical History  Procedure Laterality Date  . Cholecystectomy  2007  . Tubal ligation  1978  . Abdominal hysterectomy  2000  . Thyroid cyst excision  1996    removal of nodule  . Knee arthroscopy      left knee-multiple  . Knee arthroscopy  2000    right knee  . Replacement total knee bilateral      2006  . Carpal tunnel release  2003    bilateral   . Hernia repair  2007    incisional hernia repair x2  . 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 Bone biopsy  04/11/2011    Procedure: BONE BIOPSY;  Surgeon: Dallas Schimke;  Location: [facility];  Service: Orthopedics;  Laterality: Left;  Bone Biopsy Left Foot Third Toe   . Colonoscopy    . Toe amputation  November 2012    FAMILY HISTORY: Family History  Problem Relation Age of Onset  . Anesthesia problems Neg Hx   . Hypotension Neg Hx   . Malignant hyperthermia Neg Hx   . Pseudochol deficiency Neg Hx   . Heart attack Brother     about age 85  . Stroke Brother   . Atrial fibrillation Brother   . Diabetes Brother   . Brain cancer Father   . Prostate cancer Father   . Atrial fibrillation  Father   . Diabetes Father   . Bipolar disorder Father   . Osteoarthritis Mother   . Fibromyalgia Mother   . Thyroid disease Mother   . Stroke Sister   . Fibromyalgia Sister   . Thyroid disease Maternal Aunt   . Diabetes Paternal Aunt   . Thyroid disease Paternal Uncle   . Prostate cancer Paternal Grandfather   . Renal cancer Paternal Grandfather   . Diabetes Paternal Grandfather   . Arthritis Brother   . Bipolar disorder Daughter     SOCIAL HISTORY:  Social History   Social History  . Marital Status: Married    Spouse Name: N/A  . Number of Children: 5  . Years of Education: assoc. nur   Occupational History  . [employer]    Social History Main Topics  . Smoking  status: Current Every Day Smoker -- 0.50 packs/day for 40 years    Types: Cigarettes  . Smokeless tobacco: Never Used  . Alcohol Use: No  . Drug Use: No  . Sexual Activity: Yes    Birth Control/ Protection: Surgical   Other Topics Concern  . Not on file   Social History Narrative   Patient drinks about 4 cups of caffeine daily.   Patient is left handed.      PHYSICAL EXAM   Filed Vitals:   04/29/15 1304  BP: 141/66  Pulse: 56  Height:  (1.778 m)  Weight: 317 lb (143.79 kg)    Not recorded      Body mass index is 45.48 kg/(m^2).  PHYSICAL EXAMNIATION:  Gen: NAD, conversant, well nourised, obese, well groomed                     Cardiovascular: Regular rate rhythm, no peripheral edema, warm, nontender. Eyes: Conjunctivae clear without exudates or hemorrhage Neck: Supple, no carotid bruise. Pulmonary: Clear to auscultation bilaterally   NEUROLOGICAL EXAM:  MENTAL STATUS: Obesity tired-looking middle-aged female    Speech:    Speech is normal; fluent and spontaneous with normal comprehension.  Cognition: Mini-Mental Status Examination is 30 out of 30      Orientation to time, place and person     Normal recent and remote memory     Normal Attention span and  concentration     Normal Language, naming, repeating,spontaneous speech     Fund of knowledge   CRANIAL NERVES: CN II: Visual fields are full to confrontation. Fundoscopic exam is normal with sharp discs and no vascular changes. Pupils are round equal and briskly reactive to light. CN III, IV, VI: extraocular movement are normal. No ptosis. CN V: Facial sensation is intact to pinprick in all 3 divisions bilaterally. Corneal responses are intact.  CN VII: Face is symmetric with normal eye closure and smile. CN VIII: Hearing is normal to rubbing fingers CN IX, X: Palate elevates symmetrically. Phonation is normal. CN XI: Head turning and shoulder shrug are intact CN XII: Tongue is midline with normal movements and no atrophy.  MOTOR: There is no pronator drift of out-stretched arms. Muscle bulk and tone are normal. Muscle strength is normal.  REFLEXES: Reflexes are 2+ and symmetric at the biceps, triceps, knees, and ankles. Plantar responses are flexor.  SENSORY: Intact to light touch, pinprick, position sense, and vibration sense are intact in fingers and toes.  COORDINATION: Rapid alternating movements and fine finger movements are intact. There is no dysmetria on finger-to-nose and heel-knee-shin.    GAIT/STANCE: Posture is normal. Gait is steady with normal steps, base, arm swing, and turning. Heel and toe walking are normal. Tandem gait is normal.  Romberg is absent.   DIAGNOSTIC DATA (LABS, IMAGING, TESTING) - I reviewed patient records, labs, notes, testing and imaging myself where available.   ASSESSMENT AND PLAN  [name] is a 59 y.o. female    Excessive daytime sleepiness, fatigue,   ESS score is 13, possible obstructive sleep apnea, sleep study refer Memory loss  Laboratory evaluation B12 History of right carotid artery stenosis,  MRI of the brain does show right parietal area subcortical small vessel disease,  Repeat ultrasound of carotid  artery  Levert Feinstein, M.D. Ph.D.  [facility] [address] [place], [place] [address] Ph: [phone] Fax: [phone]  CC: Babs Sciara, MD

## 2015-04-30 LAB — VITAMIN B12: Vitamin B-12: 479 pg/mL (ref 211–946)

## 2015-05-03 ENCOUNTER — Telehealth: Payer: Self-pay | Admitting: *Deleted

## 2015-05-03 NOTE — Telephone Encounter (Signed)
Tried calling pt. Spoke to someone other than pt. Asked for them to have pt call back. Ok to inform pt labs normal per Dr Terrace ArabiaYan.

## 2015-05-03 NOTE — Telephone Encounter (Signed)
-----   Message from Levert FeinsteinYijun Yan, MD sent at 05/03/2015  9:00 AM EST ----- Please call patient for normal laboratory result

## 2015-05-04 NOTE — Telephone Encounter (Signed)
I called and spoke to pts husband ([place]ody) on [place].   Gave him normal B 12 level.  He will let pt know.

## 2015-05-05 ENCOUNTER — Other Ambulatory Visit: Payer: Self-pay | Admitting: *Deleted

## 2015-05-05 NOTE — Patient Outreach (Signed)
Secure e-mail sent to [facility] [place] e-mail address asking her to schedule [facility] follow [facility] at [facility] for sometime this month. Bary RichardJanet S. Hauser RN,CCM,CDE [facility] Care Management Coordinator Link To Wellness Office Phone [phone] Office Fax [phone]

## 2015-05-06 ENCOUNTER — Telehealth: Payer: Self-pay | Admitting: Neurology

## 2015-05-06 ENCOUNTER — Ambulatory Visit (HOSPITAL_COMMUNITY)
Admission: RE | Admit: 2015-05-06 | Discharge: 2015-05-06 | Disposition: A | Discharge status: Home / Self Care | Payer: 59 | Source: Ambulatory Visit | Attending: Neurology | Admitting: Neurology

## 2015-05-06 DIAGNOSIS — R413 Other amnesia: Secondary | ICD-10-CM | POA: Diagnosis not present

## 2015-05-06 DIAGNOSIS — E119 Type 2 diabetes mellitus without complications: Secondary | ICD-10-CM | POA: Insufficient documentation

## 2015-05-06 DIAGNOSIS — G4710 Hypersomnia, unspecified: Secondary | ICD-10-CM | POA: Diagnosis not present

## 2015-05-06 DIAGNOSIS — R51 Headache: Secondary | ICD-10-CM | POA: Insufficient documentation

## 2015-05-06 DIAGNOSIS — I6523 Occlusion and stenosis of bilateral carotid arteries: Secondary | ICD-10-CM

## 2015-05-06 DIAGNOSIS — I6521 Occlusion and stenosis of right carotid artery: Secondary | ICD-10-CM | POA: Diagnosis present

## 2015-05-06 HISTORY — DX: Occlusion and stenosis of bilateral carotid arteries: I65.23

## 2015-05-06 NOTE — Telephone Encounter (Signed)
Spoke to patient's husband on [place] - he is aware of the results and will provided them to Franconiaynthia.  Left our number to call back with any questions.

## 2015-05-06 NOTE — Telephone Encounter (Signed)
Please call patient, ultrasound of carotid artery showed moderate 50-69% stenosis of bilateral internal carotid artery, she is to continue antiplatelet agent aspirin 81 mg daily   Moderate smooth plaque formation is seen involving the proximal, middle and distal portions of the right internal carotid artery consistent with 50-69% stenosis based on ultrasound and Doppler criteria. Overall, this is not significantly changed compared to prior exam.  Moderate smooth eccentric plaque formation is noted in the middle portion of the left internal carotid artery consistent with 50-69% stenosis based on ultrasound and Doppler criteria. This appears to have increased in velocities compared to prior exam.

## 2015-05-25 ENCOUNTER — Institutional Professional Consult (permissible substitution): Payer: 59 | Admitting: Neurology

## 2015-05-25 ENCOUNTER — Other Ambulatory Visit: Payer: Self-pay | Admitting: Family Medicine

## 2015-05-25 NOTE — Telephone Encounter (Signed)
May have this and 6 refills on each

## 2015-05-25 NOTE — Telephone Encounter (Signed)
May we refill Lyrica? 

## 2015-05-26 ENCOUNTER — Encounter: Payer: Self-pay | Admitting: *Deleted

## 2015-05-26 ENCOUNTER — Other Ambulatory Visit: Payer: Self-pay | Admitting: *Deleted

## 2015-05-26 VITALS — BP 124/64

## 2015-05-26 DIAGNOSIS — E1140 Type 2 diabetes mellitus with diabetic neuropathy, unspecified: Principal | ICD-10-CM

## 2015-05-26 DIAGNOSIS — Z794 Long term (current) use of insulin: Principal | ICD-10-CM

## 2015-05-26 NOTE — Patient Outreach (Addendum)
[facility]) Care Management   05/26/2015  [name] Jun 18, 1955 [id number]  [name] is an 59 y.o. female who presents to the [place] Management office for routine Link To Wellness follow up for self management assistance with Type II DM, HTN and hyperlipidemia.  Objective:   [name] says since her last Link To Wellness visit she has seen her primary care provider, a neurologist and a Psychologist, sport and exercise. She said an MRI of her brain and a carotid doppler study were done. The neurology consult and tests were done because she is concerned about memory loss, word finding difficulties, inability to multitask and chronic fatigue. She says her B12 level was normal. She saw a surgeon because she has a large abdominal hernia. The surgeon told her she needs to lose about 35 lbs before she can have the hernia repaired.  She is aware of the results of the brain MRA and carotid doppler tests and says she is to continue on her aspirin therapy. She says the neurologist started her on Abilify because depression can cause the symptoms she is having however she thinks the medication might be causing her to have difficulty sleeping. She says she will also have a sleep study.  Review of Systems  Constitutional: Negative.     Physical Exam  Constitutional: She is oriented to person, place, and time. She appears well-developed and well-nourished.  Neurological: She is alert and oriented to person, place, and time.  Skin: Skin is warm and dry.  Psychiatric: She has a normal mood and affect. Her behavior is normal. Judgment and thought content normal.   Filed Vitals:   05/26/15 1329  BP: 124/64    Current Medications:   Current Outpatient Prescriptions  Medication Sig Dispense Refill  . ARIPiprazole (ABILIFY) 5 MG tablet Take 1 tablet (5 mg total) by mouth daily. 30 tablet 11  . aspirin 81 MG chewable tablet Chew 1 tablet (81 mg total) by mouth daily.     Marland Kitchen diltiazem (CARDIZEM) 120 MG tablet TAKE 1 TABLET BY MOUTH DAILY 90 tablet 5  . HUMALOG KWIKPEN 100 UNIT/ML KiwkPen INJECT 20-25 UNITS WITH MEALS 15 mL 5  . ibuprofen (ADVIL,MOTRIN) 800 MG tablet Take 1 tablet (800 mg total) by mouth daily.    . Insulin Glargine (LANTUS SOLOSTAR) 100 UNIT/ML Solostar Pen 30 UNITS TWO TIMES A DAY. MAY TITRATE UP TO 80 UNITS NIGHTLY 45 mL 5  . INVOKANA 300 MG TABS tablet TAKE 1 TABLET BY MOUTH EVERY MORNING. 90 tablet 3  . lisinopril (PRINIVIL,ZESTRIL) 5 MG tablet TAKE 1/2 TABLET BY MOUTH ONCE DAILY 90 tablet 3  . LYRICA 100 MG capsule TAKE 1 CAPSULE BY MOUTH TWICE DAILY 180 capsule 1  . Multiple Vitamin (MULTIVITAMIN) tablet Take 1 tablet by mouth daily.    Marland Kitchen PARoxetine (PAXIL) 20 MG tablet TAKE 4 TABLETS BY MOUTH EVERY MORNING 360 tablet 4  . traMADol (ULTRAM) 50 MG tablet TAKE 2 TABLETS BY MOUTH EVERY 8 HOURS AS NEEDED FOR PAIN 180 tablet 5   No current facility-administered medications for this visit.    Functional Status:   In your present state of health, do you have any difficulty performing the following activities: 05/26/2015 01/04/2015  Hearing? N N  Vision? N N  Difficulty concentrating or making decisions? N N  Walking or climbing stairs? N N  Dressing or bathing? - N  Doing errands, shopping? - N    Fall/Depression Screening:    PHQ  2/9 Scores 04/05/2015 09/21/2014 03/19/2014  PHQ - 2 Score 3 2 0  PHQ- 9 Score 8 9 -    Assessment:   [employer] employee and Link To Wellness member with Type II DM, HTN and hyperlipidemia:  most recent Hgb A1C= 6.7%, meeting HTN treatment targets, elevated total cholesterol and LDL but does not want to start statin therapy.  Plan:  [facility] CM Care Plan Problem One        Most Recent Value   Care Plan Problem One  Morbid obesity with BMI= 45.48 with need to lose at least 35 lbs before hernia repair surgery, Type II DM with ongoing good glycemic control and meeting Hgb A1C target of <7.0% as evidenced by Hgb  A1C= 6.7% on 04/09/15, HTN- meeting treatment targets. Hyperlipidemia with elevated total cholesterol and LDL on 04/09/15    Role Documenting the Problem One  Care Management Sneads for Problem One  Active   [facility] Long Term Goal (31-90 days)  Patient will keep appointments with RD and attend Type II DM core classes.  Patient  will show evidence of at least 5 lbs of weight loss by next Link To Wellness visit, patient will demonstrate ongoing good glycemic control as evidenced by A1C<7.0% with no reported episodes of blood sugar <60, patient's BP readings will continue to meet targets of <140/<90, and lipid profile will show improvement at next assessments.   [facility] Long Term Goal Start Date  05/26/15   [facility] Long Term Goal Met Date     Interventions for Problem One Long Term Goal  reviewed changes to patient's health history and updated patient's medication list,  reinforced importance of taking all medications as prescribed, after discussion- faxed referral for weight loss management with RD, CDE Jearld Fenton, faxed referral to enroll patient in Type II DM core classes, reviewed upcoming appointments with healthcare providers, will arrange for  Link To Wellness follow up in March 2017      RNCM to fax today's office visit note to Dr. Sallee Lange. RNCM will meet quarterly and as needed with patient per Link To Wellness program guidelines to assist with Type II DM, HTN and hyperlipidemia self-management and assess patient's progress toward mutually set goals.  Barrington Ellison RN,CCM,CDE [place] Management Coordinator Link To Wellness Office Phone [phone] Office Fax [phone]

## 2015-05-27 ENCOUNTER — Other Ambulatory Visit: Payer: Self-pay | Admitting: *Deleted

## 2015-05-27 MED ORDER — INSULIN GLARGINE 100 UNIT/ML SOLOSTAR PEN: PEN_INJECTOR | SUBCUTANEOUS | Status: DC

## 2015-06-08 ENCOUNTER — Institutional Professional Consult (permissible substitution): Payer: 59 | Admitting: Neurology

## 2015-06-10 ENCOUNTER — Ambulatory Visit: Payer: 59 | Admitting: Neurology

## 2015-06-11 ENCOUNTER — Other Ambulatory Visit: Payer: Self-pay | Admitting: Family Medicine

## 2015-06-11 MED FILL — HUMALOG 100 UNITS/ML KWIKPE: 100 | 20 days supply | Qty: 15 | Fill #0

## 2015-06-15 ENCOUNTER — Encounter (HOSPITAL_COMMUNITY): Payer: Self-pay | Admitting: Emergency Medicine

## 2015-06-15 ENCOUNTER — Emergency Department (HOSPITAL_COMMUNITY): Payer: 59

## 2015-06-15 ENCOUNTER — Emergency Department (HOSPITAL_COMMUNITY)
Admission: EM | Admit: 2015-06-15 | Discharge: 2015-06-15 | Disposition: A | Discharge status: Home / Self Care | Payer: 59 | Attending: Emergency Medicine | Admitting: Emergency Medicine

## 2015-06-15 DIAGNOSIS — F419 Anxiety disorder, unspecified: Secondary | ICD-10-CM | POA: Insufficient documentation

## 2015-06-15 DIAGNOSIS — M797 Fibromyalgia: Secondary | ICD-10-CM | POA: Insufficient documentation

## 2015-06-15 DIAGNOSIS — Z8669 Personal history of other diseases of the nervous system and sense organs: Secondary | ICD-10-CM | POA: Insufficient documentation

## 2015-06-15 DIAGNOSIS — Z791 Long term (current) use of non-steroidal anti-inflammatories (NSAID): Secondary | ICD-10-CM | POA: Insufficient documentation

## 2015-06-15 DIAGNOSIS — F329 Major depressive disorder, single episode, unspecified: Secondary | ICD-10-CM | POA: Insufficient documentation

## 2015-06-15 DIAGNOSIS — E119 Type 2 diabetes mellitus without complications: Secondary | ICD-10-CM | POA: Insufficient documentation

## 2015-06-15 DIAGNOSIS — Z8639 Personal history of other endocrine, nutritional and metabolic disease: Secondary | ICD-10-CM | POA: Insufficient documentation

## 2015-06-15 DIAGNOSIS — M25561 Pain in right knee: Secondary | ICD-10-CM | POA: Diagnosis not present

## 2015-06-15 DIAGNOSIS — Z8542 Personal history of malignant neoplasm of other parts of uterus: Secondary | ICD-10-CM | POA: Diagnosis not present

## 2015-06-15 DIAGNOSIS — Y9289 Other specified places as the place of occurrence of the external cause: Secondary | ICD-10-CM | POA: Insufficient documentation

## 2015-06-15 DIAGNOSIS — F17210 Nicotine dependence, cigarettes, uncomplicated: Secondary | ICD-10-CM | POA: Diagnosis not present

## 2015-06-15 DIAGNOSIS — I4891 Unspecified atrial fibrillation: Secondary | ICD-10-CM | POA: Insufficient documentation

## 2015-06-15 DIAGNOSIS — Z9889 Other specified postprocedural states: Secondary | ICD-10-CM | POA: Diagnosis not present

## 2015-06-15 DIAGNOSIS — M1990 Unspecified osteoarthritis, unspecified site: Secondary | ICD-10-CM | POA: Diagnosis not present

## 2015-06-15 DIAGNOSIS — Z79899 Other long term (current) drug therapy: Secondary | ICD-10-CM | POA: Diagnosis not present

## 2015-06-15 DIAGNOSIS — I10 Essential (primary) hypertension: Secondary | ICD-10-CM | POA: Diagnosis not present

## 2015-06-15 DIAGNOSIS — Z7982 Long term (current) use of aspirin: Secondary | ICD-10-CM | POA: Diagnosis not present

## 2015-06-15 DIAGNOSIS — Z794 Long term (current) use of insulin: Secondary | ICD-10-CM | POA: Diagnosis not present

## 2015-06-15 DIAGNOSIS — Y9389 Activity, other specified: Secondary | ICD-10-CM | POA: Insufficient documentation

## 2015-06-15 DIAGNOSIS — M25461 Effusion, right knee: Secondary | ICD-10-CM | POA: Diagnosis not present

## 2015-06-15 DIAGNOSIS — X58XXXA Exposure to other specified factors, initial encounter: Secondary | ICD-10-CM | POA: Diagnosis not present

## 2015-06-15 DIAGNOSIS — Y998 Other external cause status: Secondary | ICD-10-CM | POA: Insufficient documentation

## 2015-06-15 DIAGNOSIS — S8991XA Unspecified injury of right lower leg, initial encounter: Secondary | ICD-10-CM | POA: Insufficient documentation

## 2015-06-15 MED ORDER — HYDROCODONE-ACETAMINOPHEN 5-325 MG PO TABS: 1.0000 | ORAL_TABLET | ORAL | Status: DC | PRN

## 2015-06-15 MED ORDER — HYDROCODONE-ACETAMINOPHEN 5-325 MG PO TABS
1.0000 | ORAL_TABLET | Freq: Once | ORAL | Status: AC
Start: 2015-06-15 — End: 2015-06-15
  Administered 2015-06-15: 1 via ORAL
  Filled 2015-06-15: qty 1

## 2015-06-15 NOTE — ED Provider Notes (Signed)
CSN: [id number]     Arrival date & time 06/15/15  1341 History  By signing my name below, I, Murriel Hopper, attest that this documentation has been prepared under the direction and in the presence of Melburn Hake, [place].  Electronically Signed: Murriel Hopper, ED Scribe. 06/15/2015. 4:20 PM.   Chief Complaint  Patient presents with  . Knee Pain    weakness, popping sensation     Patient is a 60 y.o. female presenting with knee pain. The history is provided by the patient. No language interpreter was used.  Knee Pain Associated symptoms: no fever    HPI Comments: [name] is a 60 y.o. female who presents to the Emergency Department complaining of constant anterior and lateral right knee pain that worsens with standing and ambulation with assocaited joint swelling that has been present since this afternoon. Pt reports she had bilateral knee replacements 10 years ago, and reports weakness and a sudden popping sound in her right knee when she stood up this afternoon. Pt reports her knee "gave out" on her and she caught herself before she fell. Denies head injury or LOC. Pt states she has not taken anything for her pain at home. Pt denies numbness, tingling or weakness.   Past Medical History  Diagnosis Date  . Hypertension   . Anxiety   . Depression   . Dysrhythmia     hx of A Fib  . Diabetes mellitus     niddm  . Fibromyalgia   . Cancer (HCC)     uterine  . Neuropathy (HCC)   . Hyperlipidemia   . Atrial fibrillation (HCC)   . Degenerative joint disease   . History of stress test 2007    NM stress test negative  . Bilateral carotid artery stenosis 05/06/15    showed 50-69% stenosis bilaterally  . Cerebrovascular small vessel disease 04/26/15    per MRA of brain without contrast  . Intracranial vascular stenosis 04/26/15    significant vascular stenosis per MRA of brain without contrast   Past Surgical History  Procedure Laterality Date  . Cholecystectomy  2007  .  Tubal ligation  1978  . Abdominal hysterectomy  2000  . Thyroid cyst excision  1996    removal of nodule  . Knee arthroscopy      left knee-multiple  . Knee arthroscopy  2000    right knee  . Replacement total knee bilateral      2006  . Carpal tunnel release  2003    bilateral   . Hernia repair  2007    incisional hernia repair x2  . 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 Bone biopsy  04/11/2011    Procedure: BONE BIOPSY;  Surgeon: Dallas Schimke;  Location: [facility];  Service: Orthopedics;  Laterality: Left;  Bone Biopsy Left Foot Third Toe   . Colonoscopy    . 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 Anesthesia problems Neg Hx   . Hypotension Neg Hx   . Malignant hyperthermia Neg Hx   . Pseudochol deficiency Neg Hx   . Heart attack Brother     about age 78  . Stroke Brother   . Atrial fibrillation Brother   . Diabetes Brother   . Brain cancer Father   .  Prostate cancer Father   . Atrial fibrillation Father   . Diabetes Father   . Bipolar disorder Father   . Osteoarthritis Mother   . Fibromyalgia Mother   . Thyroid disease Mother   . Stroke Sister   . Fibromyalgia Sister   . Thyroid disease Maternal Aunt   . Diabetes Paternal Aunt   . Thyroid disease Paternal Uncle   . Prostate cancer Paternal Grandfather   . Renal cancer Paternal Grandfather   . Diabetes Paternal Grandfather   . Arthritis Brother   . Bipolar disorder Daughter    Social History  Substance Use Topics  . Smoking status: Current Every Day Smoker -- 0.50 packs/day for 40 years    Types: Cigarettes  . Smokeless tobacco: Never Used  . Alcohol Use: No   OB History    Gravida Para Term Preterm AB TAB SAB Ectopic Multiple Living   Review of Systems  Constitutional: Negative for fever.   Musculoskeletal: Positive for joint swelling and arthralgias (right knee).  Skin: Negative for wound.  Neurological: Negative for weakness and numbness.      Allergies  Chantix; Codeine; and Statins  Home Medications   Prior to Admission medications   Medication Sig Start Date End Date Taking? Authorizing Provider  ARIPiprazole (ABILIFY) 5 MG tablet Take 1 tablet (5 mg total) by mouth daily. 04/29/15   Levert Feinstein, MD  aspirin 81 MG chewable tablet Chew 1 tablet (81 mg total) by mouth daily. 02/03/13   Standley Brooking, MD  diltiazem (CARDIZEM) 120 MG tablet TAKE 1 TABLET BY MOUTH DAILY 04/28/15   Babs Sciara, MD  HUMALOG KWIKPEN 100 UNIT/ML KiwkPen INJECT 20-25 UNITS WITH MEALS 06/11/15   Babs Sciara, MD  HYDROcodone-acetaminophen (NORCO/VICODIN) 5-325 MG tablet Take 1 tablet by mouth every 4 (four) hours as needed. 06/15/15   Barrett Henle, PA-C  ibuprofen (ADVIL,MOTRIN) 800 MG tablet Take 1 tablet (800 mg total) by mouth daily. 02/03/13   Standley Brooking, MD  Insulin Glargine (LANTUS SOLOSTAR) 100 UNIT/ML Solostar Pen 30 UNITS TWO TIMES A DAY. MAY TITRATE UP TO 80 UNITS NIGHTLY 05/27/15   Babs Sciara, MD  INVOKANA 300 MG TABS tablet TAKE 1 TABLET BY MOUTH EVERY MORNING. 05/25/15   Babs Sciara, MD  lisinopril (PRINIVIL,ZESTRIL) 5 MG tablet TAKE 1/2 TABLET BY MOUTH ONCE DAILY 05/25/15   Babs Sciara, MD  LYRICA 100 MG capsule TAKE 1 CAPSULE BY MOUTH TWICE DAILY 05/25/15   Babs Sciara, MD  Multiple Vitamin (MULTIVITAMIN) tablet Take 1 tablet by mouth daily.    Historical Provider, MD  PARoxetine (PAXIL) 20 MG tablet TAKE 4 TABLETS BY MOUTH EVERY MORNING 03/18/15   Babs Sciara, MD  traMADol (ULTRAM) 50 MG tablet TAKE 2 TABLETS BY MOUTH EVERY 8 HOURS AS NEEDED FOR PAIN 04/28/15   Babs Sciara, MD   BP 136/59 mmHg  Pulse 53  Resp 20  SpO2 98% Physical Exam  Constitutional: She is oriented to person, place, and time. She appears well-developed and  well-nourished.  HENT:  Head: Normocephalic and atraumatic.  Eyes: Conjunctivae and EOM are normal. Right eye exhibits no discharge. Left eye exhibits no discharge. No scleral icterus.  Neck: Normal range of motion. Neck supple.  Cardiovascular: Normal rate.   Pulmonary/Chest: Effort normal.  Abdominal: She exhibits no distension.  Musculoskeletal: Normal range of motion. She exhibits tenderness.  Right knee: She exhibits swelling. She exhibits normal range of motion, no effusion, no ecchymosis, no deformity, no laceration, no erythema, normal alignment, no LCL laxity, normal patellar mobility and no MCL laxity.  Tenderness to palpation of right anterior knee and patella  Mild swelling noted Full ROM of right knee 5/5 strength Sensation intact 2+ DP pulses Capillary refill <2 seconds Patient able to stand and ambulate with reported pain in right knee   Neurological: She is alert and oriented to person, place, and time.  Skin: Skin is warm and dry.  Psychiatric: She has a normal mood and affect.  Nursing note and vitals reviewed.   ED Course  Procedures (including critical care time)  DIAGNOSTIC STUDIES: Oxygen Saturation is 97% on room air, normal by my interpretation.    COORDINATION OF CARE: 3:20 PM Discussed treatment plan with pt at bedside and pt agreed to plan.   Labs Review Labs Reviewed - No data to display  Imaging Review Dg Knee Complete 4 Views Right  06/15/2015  CLINICAL DATA:  Right patellar pain, hip replacement 10 years ago EXAM: RIGHT KNEE - COMPLETE 4+ VIEW COMPARISON:  None. FINDINGS: Four views of the right knee submitted. There is right knee prosthesis with anatomic alignment. Mild prepatellar soft tissue swelling. Small joint effusion. No displaced fracture or subluxation. On the lateral view there is a subtle oblique lucent line in superior aspect of patella. Subtle lucency posterior superior aspect of patella. This is highly suspicious for  nondisplaced fracture. Clinical correlation is necessary. IMPRESSION: Right knee prosthesis with anatomic alignment.On the lateral view there is a subtle oblique lucent line in superior aspect of patella. Subtle lucency posterior superior aspect of patella. This is highly suspicious for nondisplaced fracture. Clinical correlation is necessary. Small joint effusion. Electronically Signed   By: Natasha Mead M.D.   On: 06/15/2015 15:23   I have personally reviewed and evaluated these images and lab results as part of my medical decision-making.  Filed Vitals:   06/15/15 1439 06/15/15 1621  BP: 136/59   Pulse: 53 53  Resp: 20      MDM   Final diagnoses:  Right knee pain    Patient reports she felt a popping sensation with standing this afternoon resulting in associated pain and mild swelling to her right knee. Denies fall, head injury or LOC. History of bilateral knee replacements approximate 10 years ago. VSS. Exam revealed mild tenderness to right anterior knee with mild swelling. Right lower extremity otherwise neurovascularly intact. Right knee x-ray revealed subtle oblique lucent line in superior aspect of patella and posterior superior aspect of patella, suspicious for nondisplaced fracture. Knee immobilizer placed on patient in the ED. Discussed results, plan for discharge and symptomatic tx with patient. Advised patient to follow up with her orthopedist this week.  Evaluation does not show pathology requring ongoing emergent intervention or admission. Pt is hemodynamically stable and mentating appropriately. Discussed findings/results and plan with patient/guardian, who agrees with plan. All questions answered. Return precautions discussed and outpatient follow up given.    I personally performed the services described in this documentation, which was scribed in my presence. The recorded information has been reviewed and is accurate.    Satira Sark [place], PA-C 06/15/15 1820  Melene Plan, DO 06/15/15 2357

## 2015-06-15 NOTE — Discharge Instructions (Signed)
Take your medication as prescribed. I also recommend continuing to elevate your knee and applying ice for 15-20 minutes 3-4 times daily. Follow-up with your orthopedist within the next week. Return to the emergency department if symptoms worsen or new onset of fever, redness, swelling, numbness, tingling, weakness.

## 2015-06-15 NOTE — ED Notes (Signed)
Pt reports that she felt a popping sensation and weakness in r/knee.  Pain is continuous, pain increased with pressure.Reports recent episodes of pain in r/knee over last week. Pt  Has knee replacement 10 years ago.

## 2015-06-16 MED FILL — HYDROCODON-APAP 5-325: 5-325 | 2 days supply | Qty: 10 | Fill #0

## 2015-06-23 MED FILL — PARoxetine HCL 20 MG TABS: 20 | 90 days supply | Qty: 360 | Fill #1

## 2015-06-28 MED FILL — traMADol HCL 50 MG TABS: 50 | 30 days supply | Qty: 180 | Fill #2

## 2015-07-09 MED FILL — HUMALOG 100 UNITS/ML KWIKPE: 100 | 60 days supply | Qty: 45 | Fill #1

## 2015-07-27 MED FILL — LANTUS SOLOSTAR 100 UNITS/M: 100 | 56 days supply | Qty: 45 | Fill #1

## 2015-07-27 MED FILL — traMADol HCL 50 MG TABS: 50 | 30 days supply | Qty: 180 | Fill #3

## 2015-07-30 ENCOUNTER — Encounter: Payer: Self-pay | Admitting: Nurse Practitioner

## 2015-07-30 ENCOUNTER — Ambulatory Visit (INDEPENDENT_AMBULATORY_CARE_PROVIDER_SITE_OTHER): Payer: 59 | Admitting: Nurse Practitioner

## 2015-07-30 VITALS — BP 132/86 | Ht 67.75 in | Wt 323.0 lb

## 2015-07-30 DIAGNOSIS — K432 Incisional hernia without obstruction or gangrene: Secondary | ICD-10-CM

## 2015-07-30 DIAGNOSIS — Z79899 Other long term (current) drug therapy: Secondary | ICD-10-CM | POA: Diagnosis not present

## 2015-07-30 DIAGNOSIS — I10 Essential (primary) hypertension: Secondary | ICD-10-CM

## 2015-07-30 DIAGNOSIS — E119 Type 2 diabetes mellitus without complications: Principal | ICD-10-CM

## 2015-07-30 LAB — POCT GLYCOSYLATED HEMOGLOBIN (HGB A1C): HEMOGLOBIN A1C: 5.5

## 2015-07-30 MED ORDER — PHENTERMINE HCL 37.5 MG PO TABS: 37.5000 mg | ORAL_TABLET | Freq: Every day | ORAL | Status: DC

## 2015-07-30 MED FILL — PHENTERMINE 37.5 MG TABLET: 37.5 | 30 days supply | Qty: 30 | Fill #0

## 2015-07-30 NOTE — Patient Instructions (Signed)
contrave belviq qsymia

## 2015-08-03 ENCOUNTER — Encounter: Payer: Self-pay | Admitting: Nurse Practitioner

## 2015-08-03 NOTE — Progress Notes (Signed)
Subjective:  Presents for routine follow up. Continues to struggle with her weight. Limited activity. Has a large abdominal hernia that needs repair. Her specialist wants her to lose weight first. Would like medication to get her started. Had a sleep study in 2003 which showed no sleep apnea. Her specialist wants to order another one. Has had an unexplained fracture. Does not take vitamin D. History of A fib which has been stable. No chest pain/ischemic type pain or unusual SOB. Requesting an xray of abd to evaluate size of hernia. Gets regular checkups with podiatrist.   Objective:   BP 132/86 mmHg  Ht 5' 7.75" (1.721 m)  Wt 323 lb (146.512 kg)  BMI 49.47 kg/m2 NAD. Alert, oriented. Lungs clear. Heart RRR. Significant central obesity. Large bulging mass lower abd consistent with a hernia. Non tender to palpation.  Results for orders placed or performed in visit on 07/30/15  POCT glycosylated hemoglobin (Hb A1C)  Result Value Ref Range   Hemoglobin A1C 5.5      Assessment:  Problem List Items Addressed This Visit      Cardiovascular and Mediastinum   Essential hypertension, benign (Chronic)     Endocrine   Type 2 diabetes mellitus not at goal [facility](HCC) (Chronic)     Other   Morbid obesity (HCC) (Chronic)   Relevant Medications   phentermine (ADIPEX-P) 37.5 MG tablet   Ventral hernia   Relevant Orders   DG Abd 1 View    Other Visit Diagnoses    Diabetes mellitus without complication (HCC)    -  Primary    Relevant Orders    POCT glycosylated hemoglobin (Hb A1C) (Completed)    High risk medication use        Relevant Orders    DG Bone Density      Plan:  Meds ordered this encounter  Medications  . phentermine (ADIPEX-P) 37.5 MG tablet    Sig: Take 1 tablet (37.5 mg total) by mouth daily before breakfast.    Dispense:  30 tablet    Refill:  2    [facility]    Order Specific Question:  Supervising Provider    Answer:  Riccardo DubinLUKING, WILLIAM S [[id number]]   Start with 1/2  pill. DC med if any problems especially palpitations. Also encouraged patient to look into bariatric surgery which she is interested in.  Return in about 1 month (around 08/30/2015) for recheck.

## 2015-08-11 ENCOUNTER — Ambulatory Visit (HOSPITAL_COMMUNITY)
Admission: RE | Admit: 2015-08-11 | Discharge: 2015-08-11 | Disposition: A | Discharge status: Home / Self Care | Payer: 59 | Source: Ambulatory Visit | Attending: Nurse Practitioner | Admitting: Nurse Practitioner

## 2015-08-11 DIAGNOSIS — Z79899 Other long term (current) drug therapy: Secondary | ICD-10-CM | POA: Insufficient documentation

## 2015-08-11 DIAGNOSIS — K439 Ventral hernia without obstruction or gangrene: Secondary | ICD-10-CM | POA: Diagnosis not present

## 2015-08-11 DIAGNOSIS — K432 Incisional hernia without obstruction or gangrene: Secondary | ICD-10-CM | POA: Insufficient documentation

## 2015-08-11 DIAGNOSIS — Z780 Asymptomatic menopausal state: Secondary | ICD-10-CM | POA: Diagnosis not present

## 2015-08-24 MED FILL — LYRICA 100 MG CAPSULE: 100 | 90 days supply | Qty: 180 | Fill #1

## 2015-08-27 DIAGNOSIS — L851 Acquired keratosis [keratoderma] palmaris et plantaris: Secondary | ICD-10-CM | POA: Diagnosis not present

## 2015-08-27 DIAGNOSIS — L97512 Non-pressure chronic ulcer of other part of right foot with fat layer exposed: Secondary | ICD-10-CM | POA: Diagnosis not present

## 2015-08-27 DIAGNOSIS — B351 Tinea unguium: Secondary | ICD-10-CM | POA: Diagnosis not present

## 2015-08-27 MED FILL — SSD 1% CREAM: 1 | 20 days supply | Qty: 50 | Fill #0

## 2015-08-28 IMAGING — CR DG SHOULDER 2+V*L*
3 series · 3 of 3 positions shown · non-contrast
Comparison: None.

CLINICAL DATA: Left shoulder pain.

EXAM:
LEFT SHOULDER - 2+ VIEW

[view not recorded (1 of 3)]
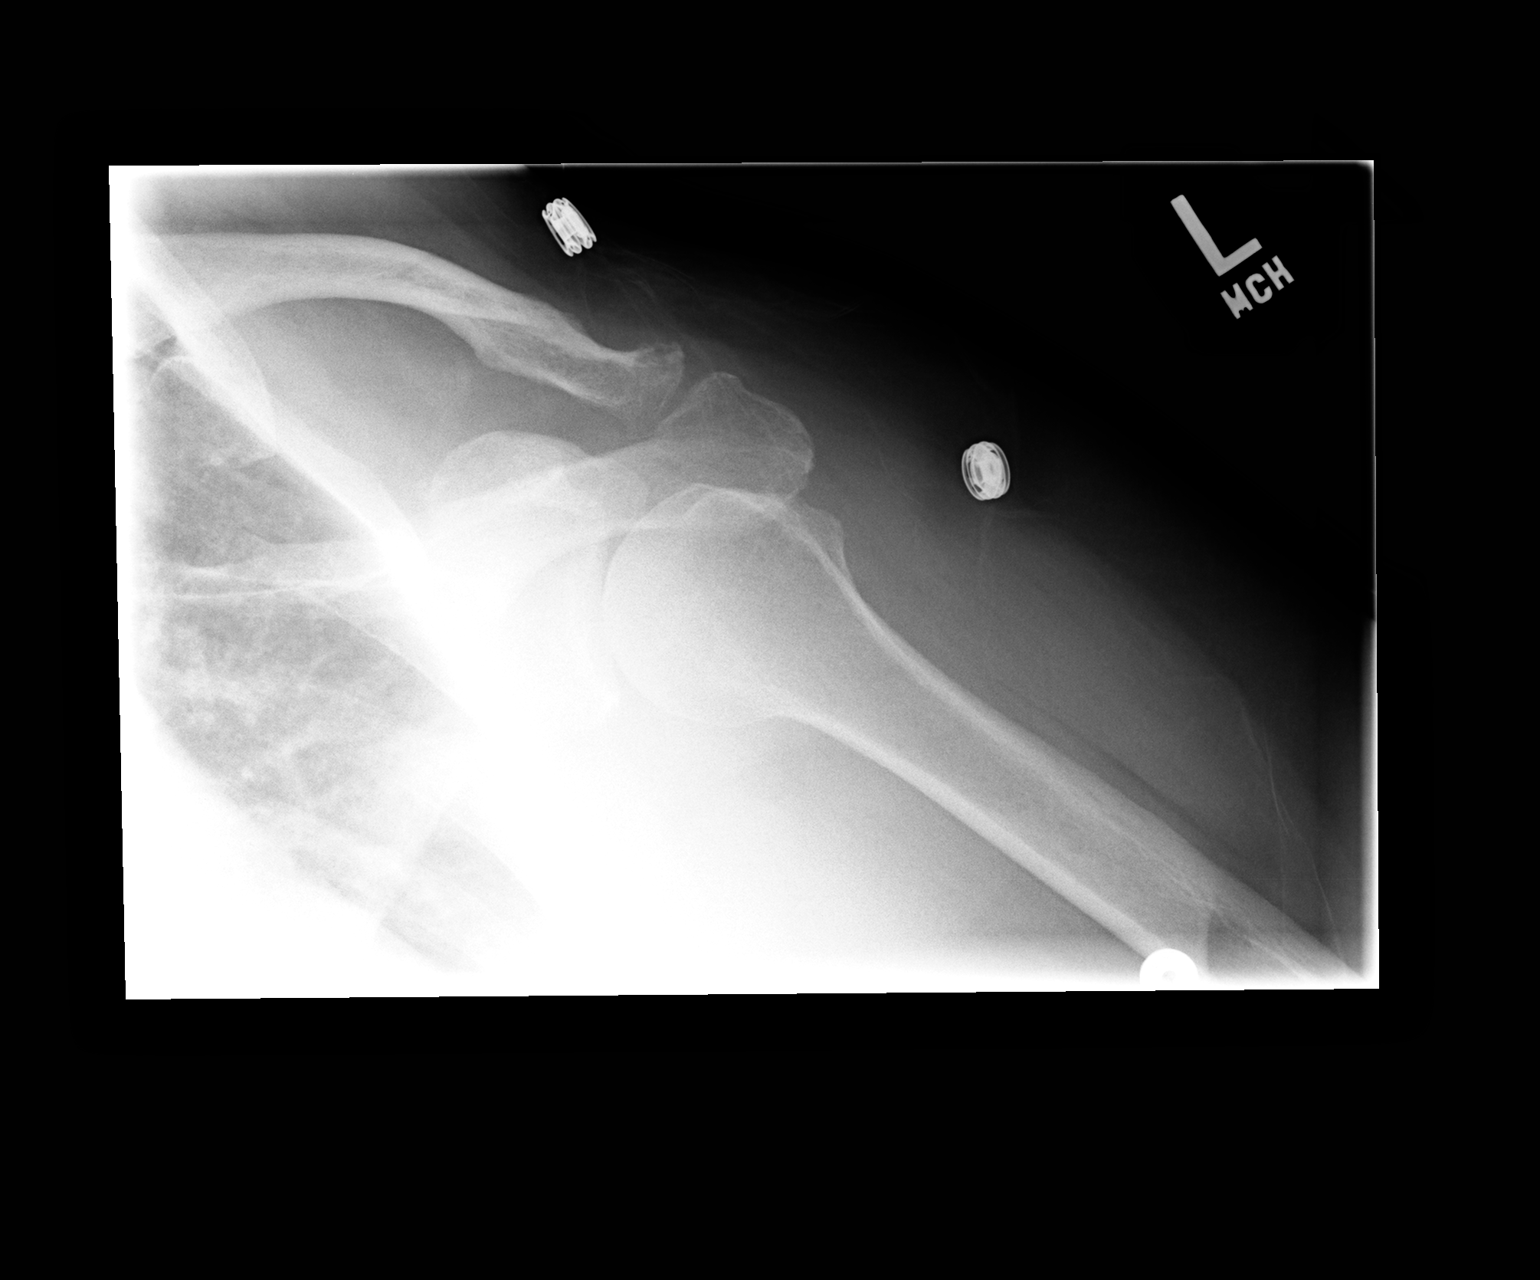

[view not recorded (2 of 3)]
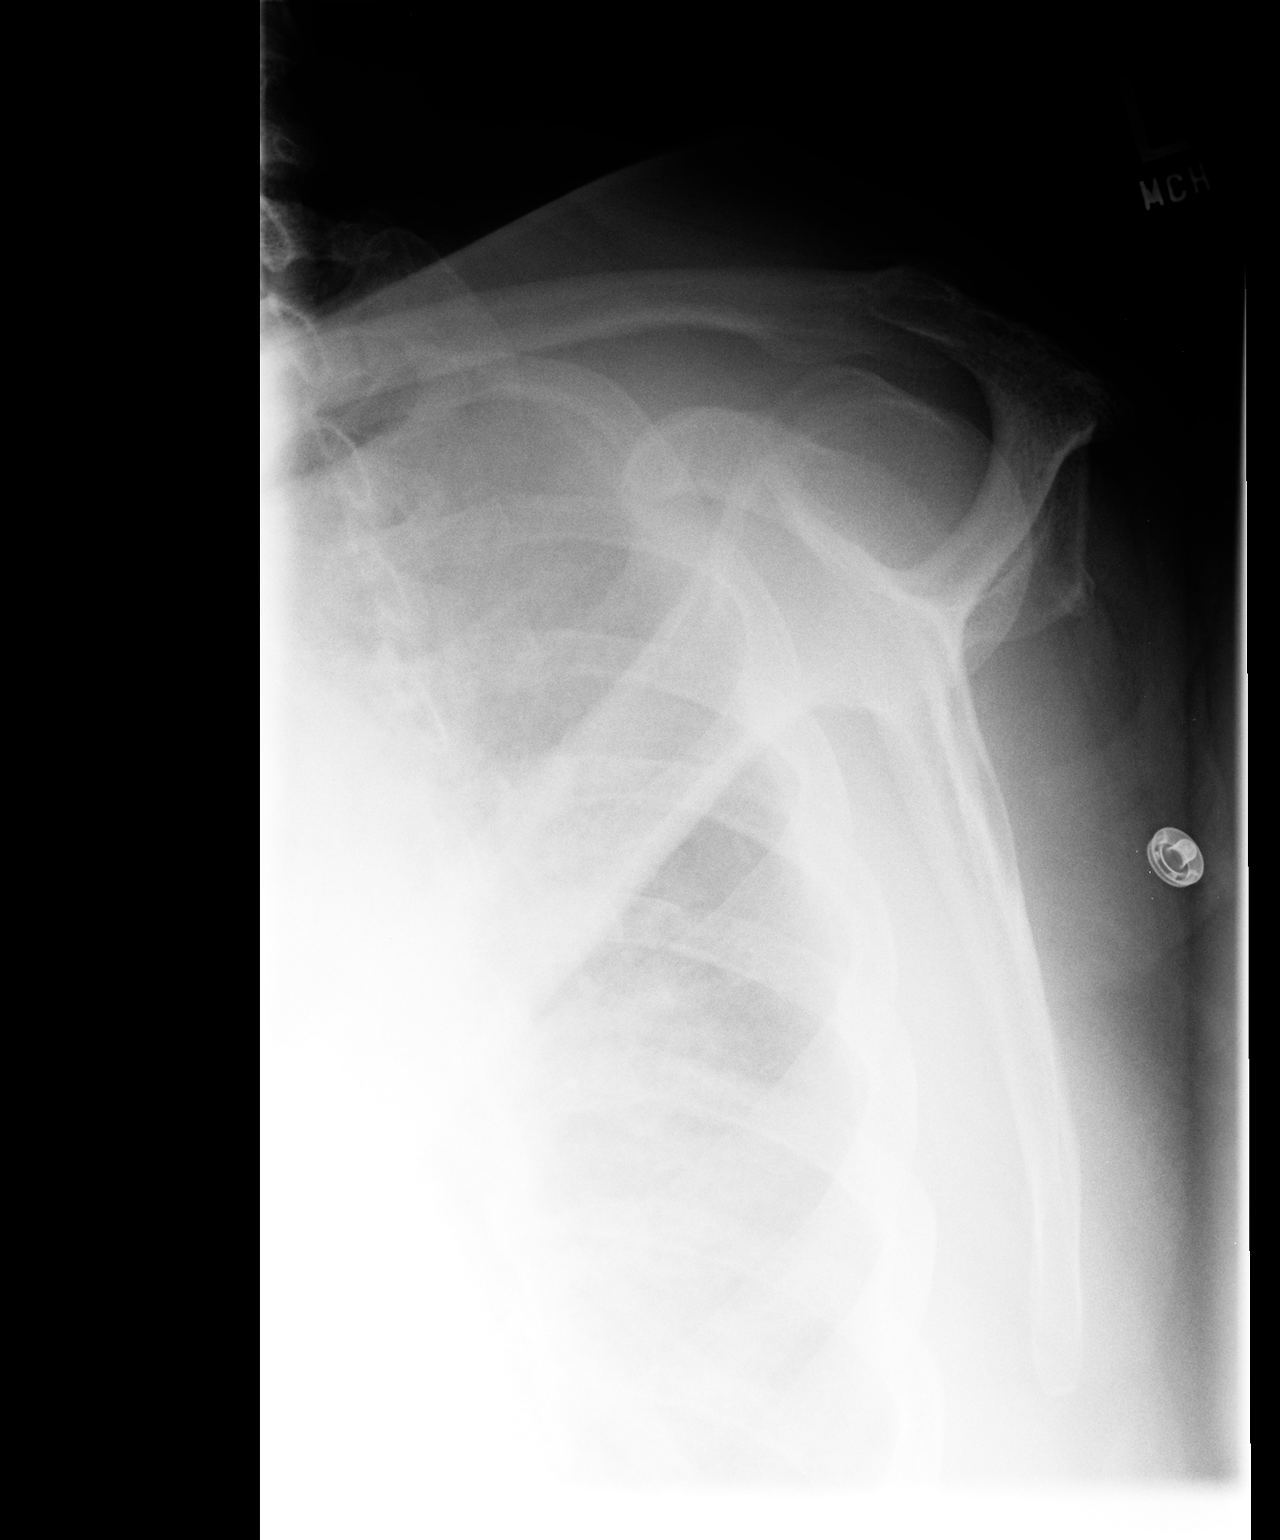

[view not recorded (3 of 3)]
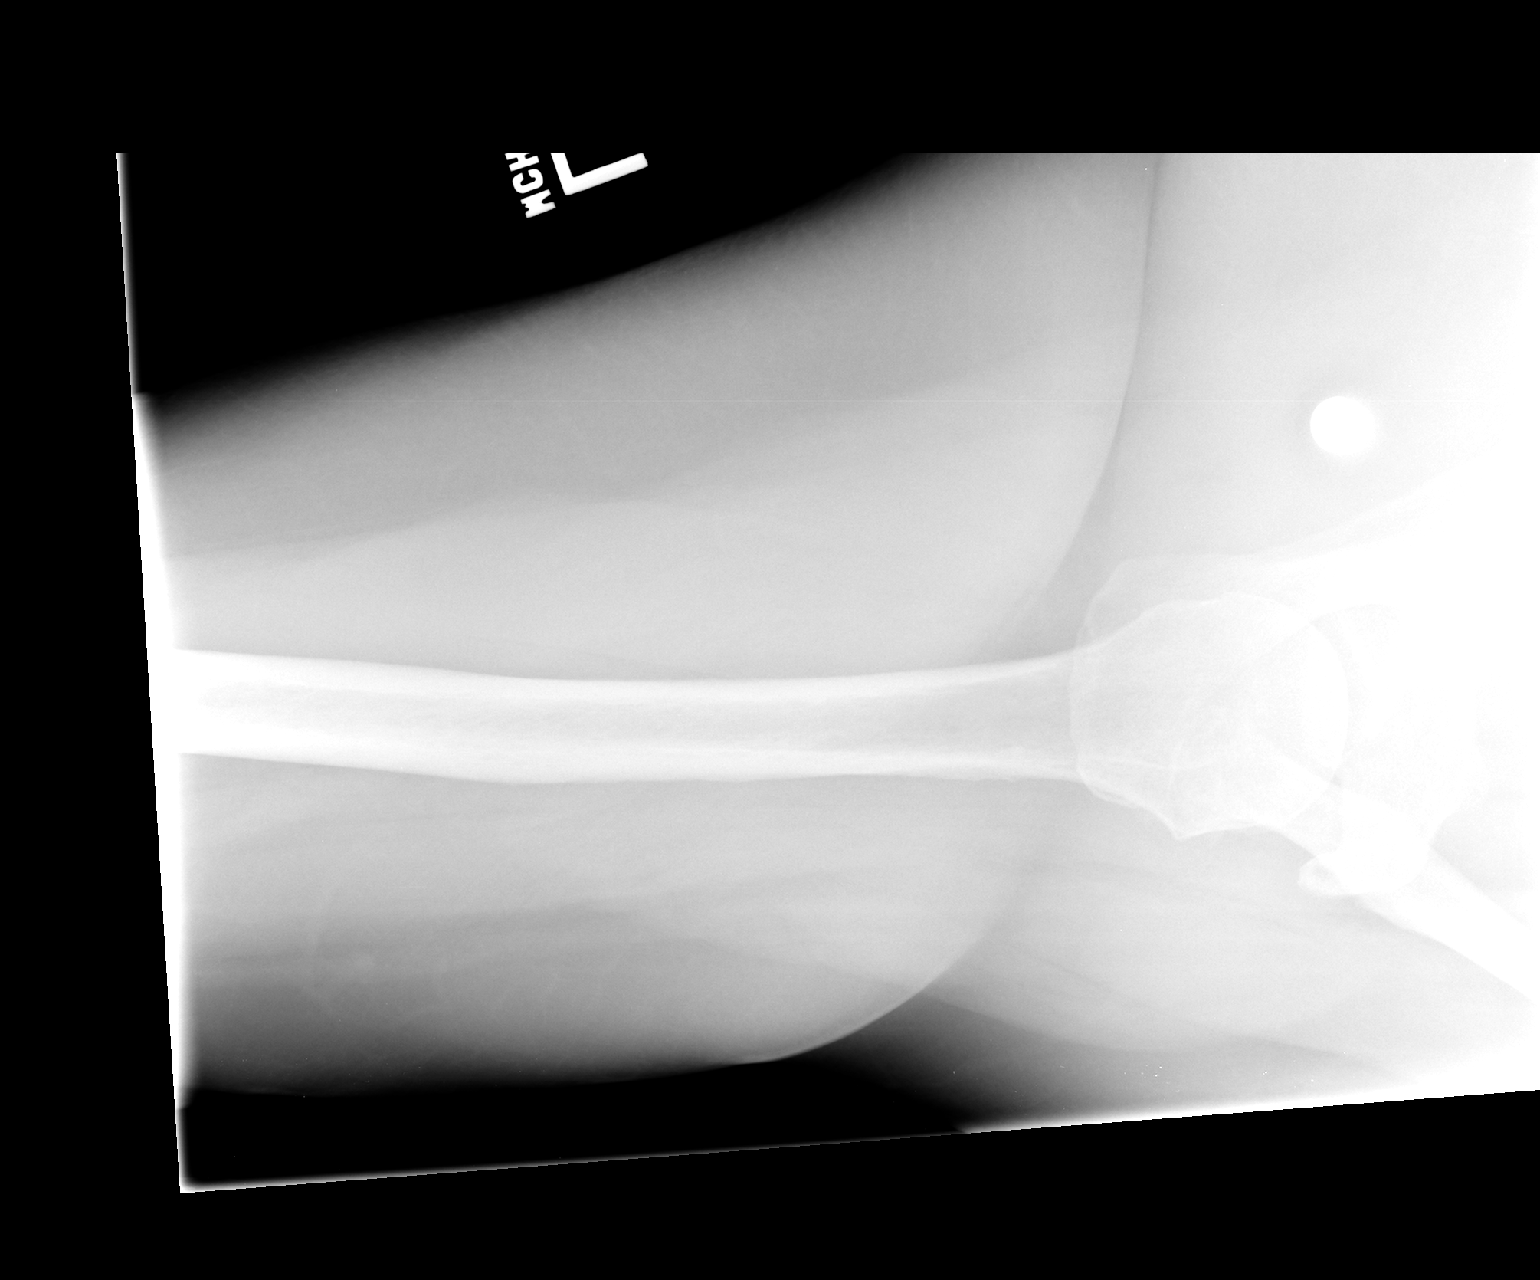

[3 of 3 positions shown; findings below may reference images not displayed]

FINDINGS: There is no evidence of fracture or dislocation. Visualized ribs
appear normal. There is no evidence of arthropathy or other focal
bone abnormality. Soft tissues are unremarkable.
IMPRESSION: Normal left shoulder.

## 2015-09-01 MED FILL — PHENTERMINE 37.5 MG TABLET: 37.5 | 30 days supply | Qty: 30 | Fill #1

## 2015-09-01 MED FILL — INVOKANA 300 MG TABLET: 300 | 90 days supply | Qty: 90 | Fill #1

## 2015-09-01 MED FILL — LISINOPRIL 5 MG TABLET: 5 | 90 days supply | Qty: 45 | Fill #1

## 2015-09-01 MED FILL — traMADol HCL 50 MG TABS: 50 | 30 days supply | Qty: 180 | Fill #4

## 2015-09-01 MED FILL — dilTIAZem HCL 120 MG TABS: 120 | 90 days supply | Qty: 90 | Fill #1

## 2015-10-05 MED FILL — traMADol HCL 50 MG TABS: 50 | 30 days supply | Qty: 180 | Fill #5

## 2015-10-05 MED FILL — PARoxetine HCL 20 MG TABS: 20 | 90 days supply | Qty: 360 | Fill #2

## 2015-10-15 MED FILL — LANTUS SOLOSTAR 100 UNITS/M: 100 | 56 days supply | Qty: 45 | Fill #2

## 2015-10-15 MED FILL — HUMALOG 100 UNITS/ML KWIKPE: 100 | 40 days supply | Qty: 30 | Fill #2

## 2015-11-01 ENCOUNTER — Ambulatory Visit: Payer: Self-pay | Admitting: *Deleted

## 2015-11-11 ENCOUNTER — Other Ambulatory Visit: Payer: Self-pay | Admitting: *Deleted

## 2015-11-11 MED ORDER — TRAMADOL HCL 50 MG PO TABS: 100.0000 mg | ORAL_TABLET | Freq: Three times a day (TID) | ORAL | Status: DC | PRN

## 2015-11-11 MED FILL — traMADol HCL 50 MG TABS: 50 | 30 days supply | Qty: 180 | Fill #0

## 2015-11-11 NOTE — Progress Notes (Signed)
Ok plus two monthly ref 

## 2015-11-24 ENCOUNTER — Other Ambulatory Visit: Payer: Self-pay | Admitting: *Deleted

## 2015-11-24 VITALS — Ht 67.75 in | Wt 308.0 lb

## 2015-11-24 DIAGNOSIS — E1169 Type 2 diabetes mellitus with other specified complication: Secondary | ICD-10-CM

## 2015-11-24 LAB — POCT CBG (FASTING - GLUCOSE)-MANUAL ENTRY: Glucose Fasting, POC: 106 mg/dL — AB (ref 70–99)

## 2015-11-24 LAB — POCT GLYCOSYLATED HEMOGLOBIN (HGB A1C): HEMOGLOBIN A1C: 6.2

## 2015-11-24 NOTE — Patient Outreach (Signed)
[facility] Care Management   11/24/2015  Michele NettlesCynthia D [name] November 29, 1955 [id number]  Michele NettlesCynthia D [name] is an 60 y.o. female who presents to the [facility] Care Management office for routine Link To Wellness follow up for self management assistance with Type II DM, HTN and morbid obesity.  Subjective: Michele AspCindy says her husband had gastric sleeve surgery on 11/08/15 and as a result of his change in eating behavior, she too is following a more CHO controlled meal plan and is losing weight. She says she now weighs 308 lbs. She says her husband has lost over 35 lbs since the week prior and following the surgery. Michele AspCindy says she is adjusting her basal and bolus insulin injections to reflect her reduction in CHO consumption so she doesn't have as many low blood sugars. She says she saw Sherie Donarolyn Hoskins NP on 3/3 and was prescribed Phentermine for weight loss but she says she is not taking it because she didn't feel it was effective. Michele AspCindy is requesting a POC Hgb A1C be assessed today.  Objective:   Review of Systems  Constitutional: Negative.     Physical Exam  Constitutional: She is oriented to person, place, and time. She appears well-developed and well-nourished.  Neurological: She is alert and oriented to person, place, and time.  Skin: Skin is warm and dry.  Psychiatric: She has a normal mood and affect. Her behavior is normal. Judgment and thought content normal.   Filed Weights   11/24/15 1500  Weight: 308 lb (139.708 kg)   POC Hgb A1C= 6.2% POC post snack CBG= 106  Encounter Medications:   Outpatient Encounter Prescriptions as of 11/24/2015  Medication Sig Note  . aspirin 81 MG chewable tablet Chew 1 tablet (81 mg total) by mouth daily.   Marland Kitchen. diltiazem (CARDIZEM) 120 MG tablet TAKE 1 TABLET BY MOUTH DAILY   . HUMALOG KWIKPEN 100 UNIT/ML KiwkPen INJECT 20-25 UNITS WITH MEALS 11/24/2015: Taking 20 units before meals  . ibuprofen (ADVIL,MOTRIN) 800  MG tablet Take 1 tablet (800 mg total) by mouth daily.   . Insulin Glargine (LANTUS SOLOSTAR) 100 UNIT/ML Solostar Pen 30 UNITS TWO TIMES A DAY. MAY TITRATE UP TO 80 UNITS NIGHTLY 11/24/2015: 25 units twice daily  . INVOKANA 300 MG TABS tablet TAKE 1 TABLET BY MOUTH EVERY MORNING.   Marland Kitchen. lisinopril (PRINIVIL,ZESTRIL) 5 MG tablet TAKE 1/2 TABLET BY MOUTH ONCE DAILY   . LYRICA 100 MG capsule TAKE 1 CAPSULE BY MOUTH TWICE DAILY   . Multiple Vitamin (MULTIVITAMIN) tablet Take 1 tablet by mouth daily.   Marland Kitchen. PARoxetine (PAXIL) 20 MG tablet TAKE 4 TABLETS BY MOUTH EVERY MORNING   . traMADol (ULTRAM) 50 MG tablet Take 2 tablets (100 mg total) by mouth every 8 (eight) hours as needed. for pain   . phentermine (ADIPEX-P) 37.5 MG tablet Take 1 tablet (37.5 mg total) by mouth daily before breakfast. (Patient not taking: Reported on 11/24/2015) 11/24/2015: Was not effective   No facility-administered encounter medications on file as of 11/24/2015.    Functional Status:   In your present state of health, do you have any difficulty performing the following activities: 05/26/2015 05/26/2015  Hearing? N N  Vision? N N  Difficulty concentrating or making decisions? N Y  Walking or climbing stairs? N Y  Dressing or bathing? - N  Doing errands, shopping? - N    Fall/Depression Screening:    PHQ 2/9 Scores 11/24/2015 04/05/2015 09/21/2014 03/19/2014  PHQ - 2 Score  2 3 2  0  PHQ- 9 Score 7 8 9  -    Assessment:  [employer] employee and Link To Wellness member with morbid obesity, Type II DM and HTN,    Plan:  [facility] CM Care Plan Problem One        Most Recent Value   Care Plan Problem One  Morbid obesity (BMI= 47.17) with intentional weight loss of 15 lbs in last 4 months , Type II DM currently meeting Hgb A1C target of <7.0% as evidenced by POC Hgb A1C= 6.2% today, HTN with consistent blood pressure readings of <140/<90   Role Documenting the Problem One  Care Management Coordinator   Care Plan for Problem One   Active   [facility] Long Term Goal (31-90 days)  Ongoing good glycemic control as evidenced by Hgb A1C<7.0% with no reported episodes of blood sugar <60 at/by next link To Wellness visit, continued good control of HTN as evidenced by consistent blood pressure readings of <140/<90 and ongoing weight loss or no weight gain   [facility] Long Term Goal Start Date  11/24/15   Interventions for Problem One Long Term Goal  Discussed husband's recent gastric sleeve surgery and implications to changes in [name]'s meal plan to accommodate husband's post op meal instructions, assigned Emmi education modules related to post bariatric surgery, congratulated Michele Aspindy on her weight loss and self adjustments to insulins to reduce hypoglycemic events, reviewed medication list and assessed adherence,  reinforced importance of taking all medications as prescribed, assessed POC CBG and Hgb A1C and discussed results and targets, reviewed upcoming appointments with healthcare providers, will arrange for  Link To Wellness follow up in December 2017     RNCM to fax today's office visit note to Sherie Donarolyn Hoskins NP.  RNCM will meet quarterly and as needed with patient per Link To Wellness program guidelines to assist with Type II DM, HTN and morbid obesity self-management and assess patient's progress toward mutually set goals.  Bary RichardJanet S. Oneka Parada RN,CCM,CDE [facility] Care Management Coordinator Link To Wellness Office Phone [phone] Office Fax [phone]

## 2015-11-25 ENCOUNTER — Encounter: Payer: Self-pay | Admitting: *Deleted

## 2015-12-10 ENCOUNTER — Other Ambulatory Visit: Payer: Self-pay | Admitting: Family Medicine

## 2015-12-10 MED FILL — LISINOPRIL 5 MG TABLET: 5 | 90 days supply | Qty: 45 | Fill #2

## 2015-12-10 MED FILL — LYRICA 100 MG CAPSULE: 100 | 90 days supply | Qty: 180 | Fill #0

## 2015-12-10 MED FILL — INVOKANA 300 MG TABLET: 300 | 90 days supply | Qty: 90 | Fill #2

## 2015-12-10 MED FILL — HUMALOG 100 UNITS/ML KWIKPE: 100 | 40 days supply | Qty: 30 | Fill #0

## 2015-12-10 MED FILL — dilTIAZem HCL 120 MG TABS: 120 | 90 days supply | Qty: 90 | Fill #2

## 2015-12-10 NOTE — Telephone Encounter (Signed)
3 refills on each patient needs office visit by September

## 2016-01-10 MED FILL — PARoxetine HCL 20 MG TABS: 20 | 90 days supply | Qty: 360 | Fill #3

## 2016-01-10 MED FILL — traMADol HCL 50 MG TABS: 50 | 30 days supply | Qty: 180 | Fill #1

## 2016-01-19 ENCOUNTER — Telehealth: Payer: Self-pay | Admitting: Family Medicine

## 2016-01-19 MED FILL — UNIFINE PENTIPS 31GX3/16: 31G X 5 MM | 30 days supply | Qty: 100 | Fill #0

## 2016-01-19 NOTE — Telephone Encounter (Signed)
Spoke with patient and informed per protocol refills were sent in on her insulin needle pens. Patient verbalized understanding.

## 2016-01-19 NOTE — Telephone Encounter (Signed)
Pt needs refill on her insulin pen needles (for her Novolog pen)  Just needs needle refill    [facility]-deliver to [facility]    Please call pt when done

## 2016-02-03 MED FILL — LANTUS SOLOSTAR 100 UNITS/M: 100 | 56 days supply | Qty: 45 | Fill #3

## 2016-02-03 MED FILL — HUMALOG 100 UNITS/ML KWIKPE: 100 | 40 days supply | Qty: 30 | Fill #1

## 2016-02-08 DIAGNOSIS — E1142 Type 2 diabetes mellitus with diabetic polyneuropathy: Secondary | ICD-10-CM | POA: Diagnosis not present

## 2016-02-08 DIAGNOSIS — L851 Acquired keratosis [keratoderma] palmaris et plantaris: Secondary | ICD-10-CM | POA: Diagnosis not present

## 2016-02-08 DIAGNOSIS — L97529 Non-pressure chronic ulcer of other part of left foot with unspecified severity: Secondary | ICD-10-CM | POA: Diagnosis not present

## 2016-02-08 DIAGNOSIS — L97519 Non-pressure chronic ulcer of other part of right foot with unspecified severity: Secondary | ICD-10-CM | POA: Diagnosis not present

## 2016-02-08 DIAGNOSIS — B351 Tinea unguium: Secondary | ICD-10-CM | POA: Diagnosis not present

## 2016-02-10 ENCOUNTER — Ambulatory Visit (INDEPENDENT_AMBULATORY_CARE_PROVIDER_SITE_OTHER): Payer: 59 | Admitting: Family Medicine

## 2016-02-10 ENCOUNTER — Encounter: Payer: Self-pay | Admitting: Family Medicine

## 2016-02-10 VITALS — BP 122/74 | Ht 67.75 in | Wt 301.4 lb

## 2016-02-10 DIAGNOSIS — Z8279 Family history of other congenital malformations, deformations and chromosomal abnormalities: Secondary | ICD-10-CM

## 2016-02-10 DIAGNOSIS — I10 Essential (primary) hypertension: Secondary | ICD-10-CM

## 2016-02-10 DIAGNOSIS — E119 Type 2 diabetes mellitus without complications: Secondary | ICD-10-CM

## 2016-02-10 DIAGNOSIS — E11610 Type 2 diabetes mellitus with diabetic neuropathic arthropathy: Secondary | ICD-10-CM | POA: Diagnosis not present

## 2016-02-10 DIAGNOSIS — E785 Hyperlipidemia, unspecified: Secondary | ICD-10-CM

## 2016-02-10 LAB — POCT GLYCOSYLATED HEMOGLOBIN (HGB A1C): HEMOGLOBIN A1C: 5.5

## 2016-02-10 MED ORDER — EMPAGLIFLOZIN 25 MG PO TABS: 25.0000 mg | ORAL_TABLET | Freq: Every day | ORAL | 2 refills | Status: DC

## 2016-02-10 MED FILL — JARDIANCE 25 MG TABLET: 25 | 90 days supply | Qty: 90 | Fill #0

## 2016-02-10 NOTE — Addendum Note (Signed)
Addended by: Metro KungICHARDS, WENDY M on: 02/10/2016 01:37 PM   Modules accepted: Orders

## 2016-02-10 NOTE — Progress Notes (Signed)
   Subjective:    Patient ID: Michele NettlesCynthia D [name], female    DOB: 08/06/1955, 60 y.o.   MRN: [id number]  Diabetes  She presents for her follow-up diabetic visit. She has type 2 diabetes mellitus. Pertinent negatives for hypoglycemia include no confusion. Pertinent negatives for diabetes include no chest pain, no fatigue, no polydipsia, no polyphagia and no weakness. She is compliant with treatment all of the time. She is following a diabetic diet. Exercise: goes to the [employer]. swimming. Her breakfast blood glucose range is generally 90-110 mg/dl. She sees a podiatrist (had diabetic foot exam yesterday).Eye exam is current.   Pt wants shingles vaccine. rx given.   Mother has a condition and pt wants to be tested for schc mutation colon p. [id number].  Patient states neuropathy under decent control Takes her blood pressure medicine on regular basis watches salt Trying to lose weight has morbid obesity Diabetes under very good control A1c looks good denies any low sugar spells States her moods overall are doing well Has morbid obesity osteoarthritis trying to lose weight Her mother was diagnosed with a genetic mutation she was recommended to get tested for the mutation Patient on number, but this has been linked to the possibility of having amputations this was discussed in detail with the patient. 25 minutes was spent with the patient. Greater than half the time was spent in discussion and answering questions and counseling regarding the issues that the patient came in for today.   Review of Systems  Constitutional: Negative for activity change, appetite change and fatigue.  HENT: Negative for congestion.   Respiratory: Negative for cough.   Cardiovascular: Negative for chest pain.  Gastrointestinal: Negative for abdominal pain.  Endocrine: Negative for polydipsia and polyphagia.  Neurological: Negative for weakness.  Psychiatric/Behavioral: Negative for confusion.       Objective:   Physical  Exam  Constitutional: She appears well-nourished. No distress.  Cardiovascular: Normal rate, regular rhythm and normal heart sounds.   No murmur heard. Pulmonary/Chest: Effort normal and breath sounds normal. No respiratory distress.  Musculoskeletal: She exhibits no edema.  Lymphadenopathy:    She has no cervical adenopathy.  Neurological: She is alert. She exhibits normal muscle tone.  Psychiatric: Her behavior is normal.  Vitals reviewed.     25 minutes was spent with the patient. Greater than half the time was spent in discussion and answering questions and counseling regarding the issues that the patient came in for today.     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 Switch away from Invokana. Use JardiaNCE  Morbid obesity patient is losing weight looking at getting gastric sleeve I believe that is a good idea for her and I believe it is medically indicated  Diabetic foot previous am dictation has calluses as well as deformities plus also neuropathy patient would benefit from diabetic shoes  Hyperlipidemia continue current medications recent lab work reviewed

## 2016-02-18 DIAGNOSIS — K432 Incisional hernia without obstruction or gangrene: Secondary | ICD-10-CM | POA: Diagnosis not present

## 2016-02-22 ENCOUNTER — Encounter: Payer: Self-pay | Admitting: Genetic Counselor

## 2016-02-22 ENCOUNTER — Other Ambulatory Visit (HOSPITAL_COMMUNITY): Payer: Self-pay | Admitting: General Surgery

## 2016-02-22 ENCOUNTER — Telehealth: Payer: Self-pay | Admitting: Genetic Counselor

## 2016-02-22 NOTE — Telephone Encounter (Signed)
Error

## 2016-02-22 NOTE — Telephone Encounter (Signed)
Appt scheduled for genetic counseling w/Kayla Boggs for Thursday at 11/2 at 1pm. Demographics verified. Pt aware to arrive 30 minutes early. Letter mailed to the patient.

## 2016-03-01 DIAGNOSIS — E1142 Type 2 diabetes mellitus with diabetic polyneuropathy: Secondary | ICD-10-CM | POA: Diagnosis not present

## 2016-03-07 ENCOUNTER — Other Ambulatory Visit (HOSPITAL_COMMUNITY): Payer: Self-pay | Admitting: Podiatry

## 2016-03-07 DIAGNOSIS — L03115 Cellulitis of right lower limb: Secondary | ICD-10-CM | POA: Diagnosis not present

## 2016-03-07 DIAGNOSIS — M86171 Other acute osteomyelitis, right ankle and foot: Secondary | ICD-10-CM

## 2016-03-07 DIAGNOSIS — E1142 Type 2 diabetes mellitus with diabetic polyneuropathy: Secondary | ICD-10-CM | POA: Diagnosis not present

## 2016-03-07 DIAGNOSIS — M869 Osteomyelitis, unspecified: Secondary | ICD-10-CM | POA: Diagnosis not present

## 2016-03-07 MED FILL — traMADol HCL 50 MG TABS: 50 | 30 days supply | Qty: 180 | Fill #2

## 2016-03-07 MED FILL — CLINDAMYCIN HCL 300 MG CAPS: 300 | 10 days supply | Qty: 30 | Fill #0

## 2016-03-08 ENCOUNTER — Other Ambulatory Visit: Payer: Self-pay | Admitting: Podiatry

## 2016-03-08 ENCOUNTER — Ambulatory Visit (HOSPITAL_COMMUNITY)
Admission: RE | Admit: 2016-03-08 | Discharge: 2016-03-08 | Disposition: A | Discharge status: Home / Self Care | Payer: 59 | Source: Ambulatory Visit | Attending: Podiatry | Admitting: Podiatry

## 2016-03-08 DIAGNOSIS — L03115 Cellulitis of right lower limb: Secondary | ICD-10-CM | POA: Diagnosis not present

## 2016-03-08 DIAGNOSIS — M86171 Other acute osteomyelitis, right ankle and foot: Secondary | ICD-10-CM | POA: Insufficient documentation

## 2016-03-08 DIAGNOSIS — R600 Localized edema: Secondary | ICD-10-CM | POA: Insufficient documentation

## 2016-03-08 LAB — POCT I-STAT CREATININE: CREATININE: 0.8 mg/dL (ref 0.44–1.00)

## 2016-03-08 MED ORDER — GADOBENATE DIMEGLUMINE 529 MG/ML IV SOLN
20.0000 mL | Freq: Once | INTRAVENOUS | Status: AC | PRN
  Administered 2016-03-08: 20 mL via INTRAVENOUS

## 2016-03-08 NOTE — Patient Instructions (Signed)
EVALYSE STROOPE  03/08/2016     @PREFPERIOPPHARMACY @   Your procedure is scheduled on  03/10/2016   Report to [facility] at  615  A.M.  Call this number if you have problems the morning of surgery:  [phone]   Remember:  Do not eat food or drink liquids after midnight.  Take these medicines the morning of surgery with A SIP OF WATER  Cardiazem, lyrica, paxil, ultram Take 1/2 of your usual night dosage of insulin the night before your surgery. DO NOT take any medicines for diabetes the morning of surgery.   Do not wear jewelry, make-up or nail polish.  Do not wear lotions, powders, or perfumes, or deoderant.  Do not shave 48 hours prior to surgery.  Men may shave face and neck.  Do not bring valuables to the hospital.  [facility] is not responsible for any belongings or valuables.  Contacts, dentures or bridgework may not be worn into surgery.  Leave your suitcase in the car.  After surgery it may be brought to your room.  For patients admitted to the hospital, discharge time will be determined by your treatment team.  Patients discharged the day of surgery will not be allowed to drive home.   Name and phone number of your driver:   family Special instructions:  none  Please read over the following fact sheets that you were given. Anesthesia Post-op Instructions and Care and Recovery After Surgery      Phantom Limb Pain Phantom limb pain occurs in an arm or leg following an amputation. It is pain in an extremity that no longer exists. This pain varies with different patients. Different activities may cause the pain. Some people with an amputated limb experience the opposite of phantom pain, which is phantom pleasure.  The trouble may start in a part of the brain known as the sensory cortex. The sensory cortex is the portion of your brain that processes sensations from the rest of your body. It is hypothesized that when a body part is lost, the corresponding  part of the brain is not able to handle the loss and rewires its circuitry to make up for the signals it no longer receives from the missing extremity. The exact mechanism of how phantom limb pain occurs is not known. The severity of pain seems to be correlated with personal problems such as stress and attitude. It also seems to correlate with the amount of pain a person had before the operation. CAUSES   Damaged nerve endings.  Scar tissue at the amputation site. TREATMENT  Phantom limb pain can be severe and debilitating. Most cases of phantom limb pain only last briefly. There are a number of different therapies and medications that may give relief. Keep working with your health care provider until relief is obtained. Some treatments that may be helpful include:  Hypnosis and mental imagery. Their techniques can give patients the needed impetus to recognize their ability to regain control.  Biofeedback.  Relaxation techniques. They are related to hypnosis techniques and use the mind and body to control pain.  Acupuncture.  Massage.  Exercise.  Antidepressant medicine.  Anticonvulsant medicine.  Narcotics or pain medicines. SEEK MEDICAL CARE IF: Pain is not relieved or increases.   This information is not intended to replace advice given to you by your health care provider. Make sure you discuss any questions you have with your health care provider.   Document  Released: 08/05/2002 Document Revised: 01/15/2013 Document Reviewed: 10/15/2012 Elsevier Interactive Patient Education 2016 Elsevier Inc. Living With an Amputation The most common causes of amputation from the hip down include:  Diseases that:  Reduce the blood flow to an area of your body.  Decrease your body's ability to fight infection.  Traumatic injuries that cause significant damage to body tissues.  Birth defects.  Cancerous lumps (malignant tumors). Amputation above the hip is usually the result of  trauma or birth defect. Disease is a less common cause. Living with an amputation can be challenging, but you can still live a long, productive life. WHAT ARE THE COMMON CHALLENGES OF LIVING WITH AN AMPUTATION? The most common challenges are mobility and self-care. With some new habits, though, it is often possible to do all of the activities you used to do. You may be able to use a device that substitutes for your limb (prosthesis). The prosthesis helps you adapt more quickly to these challenges. Your health care provider can help select a prosthesis to meet your needs. A person who helps you choose and fits you with a prosthesis (prosthetist) may also help. A rehabilitation program can also help you gain mobility and self-reliance. Your rehabilitation team may include:  Physicians.  Physical and occupational therapists.  Prosthetists.  Nurses.  Social workers.  Psychologists.  Dietitians. Your rehabilitation team will help you with all aspects of recovery and returning to work, home, sports, and your community. You will learn to:  Get around safely.  Adjust your home.  Exercise.  Use a prosthetic.  Work through Surveyor, quantity.  Connect with other people who have gone through the same experience. Additional challenges may include:  Grieving period.  Body image issues.  Lifestyle issues, such as sex.  Maintaining a healthy weight. These issues are normal. Discuss these with your rehabilitation team. WHEN CAN I RETURN TO MY REGULAR ACTIVITIES?  Returning to your normal activities is part of healing. Changes can often be made to equipment that allow you to return to a sport or hobby. Some Arboriculturist for this. Discuss all of your leisure interests with your health care provider and prosthetist. WHEN CAN I RETURN TO WORK? When you are ready to return to work, your therapists can perform job site evaluations and make recommendations to help you  perform your job. You may not be able to return to your same job. Your local Office of Vocational Rehabilitation can assist you in job retraining. FOR MORE INFORMATION: Visit these online resources. You can find tips on everything from getting dressed and using bathrooms to driving and travel considerations. These resources can also connect you to a network of emotional support, activities, and innovations. You may also search the Internet to find a local support group.  Amputee Coalition: http://www.amputee-coalition.org/ensuring-fall-safety/http://www.amputee-coalition.org/ensuring-fall-safety/  Amputee Support Groups: http://amputee.TempNets.tn.supportgroups.com  Daily Strength: GenuineNerd.fi  [employer]: http://www.nationalamputation.org/http://www.nationalamputation.[facility] on Health, Physical Activity and Disability: http://blankenship-martinez.net/.org  Disabled Sports [place]: http://www.disabledsportsusa.orghttp://www.disabledsportsusa.org  American Academy of Orthotists and Prosthetists: https://garcia.net/.org   This information is not intended to replace advice given to you by your health care provider. Make sure you discuss any questions you have with your health care provider.   Document Released: 02/04/2002 Document Revised: 06/05/2014 Document Reviewed: 09/30/2013 Elsevier Interactive Patient Education 2016 Elsevier Inc. PATIENT INSTRUCTIONS POST-ANESTHESIA  IMMEDIATELY FOLLOWING SURGERY:  Do not drive or operate machinery for the first twenty four hours after surgery.  Do not make any important decisions for twenty four hours  after surgery or while taking narcotic pain medications or sedatives.  If you develop intractable nausea and vomiting or a severe headache please notify your doctor  immediately.  FOLLOW-UP:  Please make an appointment with your surgeon as instructed. You do not need to follow up with anesthesia unless specifically instructed to do so.  WOUND CARE INSTRUCTIONS (if applicable):  Keep a dry clean dressing on the anesthesia/puncture wound site if there is drainage.  Once the wound has quit draining you may leave it open to air.  Generally you should leave the bandage intact for twenty four hours unless there is drainage.  If the epidural site drains for more than 36-48 hours please call the anesthesia department.  QUESTIONS?:  Please feel free to call your physician or the hospital operator if you have any questions, and they will be happy to assist you.

## 2016-03-09 ENCOUNTER — Encounter (HOSPITAL_COMMUNITY): Payer: Self-pay

## 2016-03-09 ENCOUNTER — Encounter (HOSPITAL_COMMUNITY)
Admission: RE | Admit: 2016-03-09 | Discharge: 2016-03-09 | Disposition: A | Discharge status: Home / Self Care | Payer: 59 | Source: Ambulatory Visit | Attending: Podiatry | Admitting: Podiatry

## 2016-03-09 DIAGNOSIS — E1142 Type 2 diabetes mellitus with diabetic polyneuropathy: Secondary | ICD-10-CM | POA: Diagnosis not present

## 2016-03-09 DIAGNOSIS — M869 Osteomyelitis, unspecified: Secondary | ICD-10-CM | POA: Diagnosis not present

## 2016-03-10 ENCOUNTER — Encounter (HOSPITAL_COMMUNITY): Payer: Self-pay | Admitting: *Deleted

## 2016-03-10 ENCOUNTER — Encounter (HOSPITAL_COMMUNITY)
Admission: RE | Disposition: A | Discharge status: Home / Self Care | Payer: Self-pay | Source: Ambulatory Visit | Attending: Podiatry

## 2016-03-10 ENCOUNTER — Ambulatory Visit (HOSPITAL_COMMUNITY)
Admission: RE | Admit: 2016-03-10 | Discharge: 2016-03-10 | Disposition: A | Discharge status: Home / Self Care | Payer: 59 | Source: Ambulatory Visit | Attending: Podiatry | Admitting: Podiatry

## 2016-03-10 ENCOUNTER — Ambulatory Visit (HOSPITAL_COMMUNITY): Payer: 59 | Admitting: Anesthesiology

## 2016-03-10 ENCOUNTER — Ambulatory Visit (HOSPITAL_COMMUNITY): Payer: 59

## 2016-03-10 DIAGNOSIS — E1142 Type 2 diabetes mellitus with diabetic polyneuropathy: Secondary | ICD-10-CM | POA: Diagnosis not present

## 2016-03-10 DIAGNOSIS — Z89421 Acquired absence of other right toe(s): Secondary | ICD-10-CM | POA: Diagnosis not present

## 2016-03-10 DIAGNOSIS — I10 Essential (primary) hypertension: Secondary | ICD-10-CM | POA: Insufficient documentation

## 2016-03-10 DIAGNOSIS — Z6841 Body Mass Index (BMI) 40.0 and over, adult: Secondary | ICD-10-CM | POA: Diagnosis not present

## 2016-03-10 DIAGNOSIS — Z9889 Other specified postprocedural states: Secondary | ICD-10-CM

## 2016-03-10 DIAGNOSIS — E1169 Type 2 diabetes mellitus with other specified complication: Secondary | ICD-10-CM | POA: Diagnosis not present

## 2016-03-10 DIAGNOSIS — M868X7 Other osteomyelitis, ankle and foot: Secondary | ICD-10-CM | POA: Insufficient documentation

## 2016-03-10 DIAGNOSIS — M797 Fibromyalgia: Secondary | ICD-10-CM | POA: Insufficient documentation

## 2016-03-10 DIAGNOSIS — E11621 Type 2 diabetes mellitus with foot ulcer: Secondary | ICD-10-CM | POA: Insufficient documentation

## 2016-03-10 DIAGNOSIS — L97519 Non-pressure chronic ulcer of other part of right foot with unspecified severity: Secondary | ICD-10-CM | POA: Diagnosis not present

## 2016-03-10 DIAGNOSIS — M869 Osteomyelitis, unspecified: Secondary | ICD-10-CM | POA: Diagnosis not present

## 2016-03-10 HISTORY — PX: AMPUTATION: SHX166

## 2016-03-10 LAB — GLUCOSE, CAPILLARY
Glucose-Capillary: 150 mg/dL — ABNORMAL HIGH (ref 65–99)
Glucose-Capillary: 157 mg/dL — ABNORMAL HIGH (ref 65–99)

## 2016-03-10 SURGERY — AMPUTATION, FOOT, RAY
Anesthesia: Monitor Anesthesia Care | Site: Foot | Laterality: Right

## 2016-03-10 MED ORDER — 0.9 % SODIUM CHLORIDE (POUR BTL) OPTIME
TOPICAL | Status: DC | PRN
  Administered 2016-03-10: 1000 mL

## 2016-03-10 MED ORDER — CHLORHEXIDINE GLUCONATE CLOTH 2 % EX PADS: 6.0000 | MEDICATED_PAD | Freq: Once | CUTANEOUS | Status: DC

## 2016-03-10 MED ORDER — FENTANYL CITRATE (PF) 100 MCG/2ML IJ SOLN
INTRAMUSCULAR | Status: AC
  Filled 2016-03-10: qty 2

## 2016-03-10 MED ORDER — ONDANSETRON HCL 4 MG/2ML IJ SOLN
INTRAMUSCULAR | Status: AC
  Filled 2016-03-10: qty 2

## 2016-03-10 MED ORDER — CEFAZOLIN SODIUM-DEXTROSE 2-4 GM/100ML-% IV SOLN
INTRAVENOUS | Status: AC
  Filled 2016-03-10: qty 100

## 2016-03-10 MED ORDER — MIDAZOLAM HCL 2 MG/2ML IJ SOLN
INTRAMUSCULAR | Status: AC
  Filled 2016-03-10: qty 2

## 2016-03-10 MED ORDER — PROPOFOL 500 MG/50ML IV EMUL
INTRAVENOUS | Status: DC | PRN
  Administered 2016-03-10: 150 ug/kg/min via INTRAVENOUS

## 2016-03-10 MED ORDER — PROPOFOL 10 MG/ML IV BOLUS: INTRAVENOUS | Status: DC | PRN

## 2016-03-10 MED ORDER — PROPOFOL 500 MG/50ML IV EMUL: INTRAVENOUS | Status: DC | PRN

## 2016-03-10 MED ORDER — BUPIVACAINE HCL (PF) 0.5 % IJ SOLN
INTRAMUSCULAR | Status: DC | PRN
  Administered 2016-03-10: 20 mL

## 2016-03-10 MED ORDER — MIDAZOLAM HCL 5 MG/5ML IJ SOLN
INTRAMUSCULAR | Status: DC | PRN
  Administered 2016-03-10 (×2): 1 mg via INTRAVENOUS

## 2016-03-10 MED ORDER — DEXTROSE 5 % IV SOLN
3.0000 g | INTRAVENOUS | Status: AC
  Administered 2016-03-10: 3 g via INTRAVENOUS
  Filled 2016-03-10: qty 3000

## 2016-03-10 MED ORDER — BUPIVACAINE HCL (PF) 0.5 % IJ SOLN
INTRAMUSCULAR | Status: AC
  Filled 2016-03-10: qty 30

## 2016-03-10 MED ORDER — ONDANSETRON HCL 4 MG/2ML IJ SOLN
4.0000 mg | Freq: Once | INTRAMUSCULAR | Status: AC
  Administered 2016-03-10: 4 mg via INTRAVENOUS

## 2016-03-10 MED ORDER — LIDOCAINE HCL (CARDIAC) 20 MG/ML IV SOLN
INTRAVENOUS | Status: DC | PRN
  Administered 2016-03-10: 25 mg via INTRAVENOUS

## 2016-03-10 MED ORDER — SUCCINYLCHOLINE CHLORIDE 20 MG/ML IJ SOLN
INTRAMUSCULAR | Status: AC
  Filled 2016-03-10: qty 1

## 2016-03-10 MED ORDER — GLYCOPYRROLATE 0.2 MG/ML IJ SOLN
0.2000 mg | Freq: Once | INTRAMUSCULAR | Status: AC | PRN
  Administered 2016-03-10: 0.2 mg via INTRAVENOUS

## 2016-03-10 MED ORDER — MIDAZOLAM HCL 2 MG/2ML IJ SOLN
1.0000 mg | INTRAMUSCULAR | Status: DC | PRN
  Administered 2016-03-10: 2 mg via INTRAVENOUS

## 2016-03-10 MED ORDER — FENTANYL CITRATE (PF) 100 MCG/2ML IJ SOLN
25.0000 ug | INTRAMUSCULAR | Status: DC | PRN
Start: 2016-03-10 — End: 2016-03-10
  Administered 2016-03-10: 25 ug via INTRAVENOUS

## 2016-03-10 MED ORDER — LACTATED RINGERS IV SOLN
INTRAVENOUS | Status: DC
  Administered 2016-03-10: 1000 mL via INTRAVENOUS

## 2016-03-10 MED ORDER — LIDOCAINE HCL (PF) 1 % IJ SOLN
INTRAMUSCULAR | Status: AC
  Filled 2016-03-10: qty 5

## 2016-03-10 MED ORDER — HYDROMORPHONE HCL 1 MG/ML IJ SOLN: 0.2500 mg | INTRAMUSCULAR | Status: DC | PRN

## 2016-03-10 MED ORDER — GLYCOPYRROLATE 0.2 MG/ML IJ SOLN
INTRAMUSCULAR | Status: AC
  Filled 2016-03-10: qty 1

## 2016-03-10 MED ORDER — CEFAZOLIN IN D5W 1 GM/50ML IV SOLN
INTRAVENOUS | Status: AC
  Filled 2016-03-10: qty 50

## 2016-03-10 SURGICAL SUPPLY — 47 items
BAG HAMPER (MISCELLANEOUS) ×3 IMPLANT
BANDAGE ELASTIC 4 LF NS (GAUZE/BANDAGES/DRESSINGS) ×3 IMPLANT
BANDAGE ELASTIC 4 VELCRO NS (GAUZE/BANDAGES/DRESSINGS) ×3 IMPLANT
BANDAGE ESMARK 4X12 BL STRL LF (DISPOSABLE) ×1 IMPLANT
BENZOIN TINCTURE PRP APPL 2/3 (GAUZE/BANDAGES/DRESSINGS) ×3 IMPLANT
BLADE AVERAGE 25MMX9MM (BLADE) ×1
BLADE AVERAGE 25X9 (BLADE) ×2 IMPLANT
BLADE OSC/SAG 11.5X5.5X.38 (BLADE) ×3 IMPLANT
BLADE SURG 15 STRL LF DISP TIS (BLADE) ×2 IMPLANT
BLADE SURG 15 STRL SS (BLADE) ×4
BNDG CONFORM 2 STRL LF (GAUZE/BANDAGES/DRESSINGS) ×3 IMPLANT
BNDG ESMARK 4X12 BLUE STRL LF (DISPOSABLE) ×3
BNDG GAUZE ELAST 4 BULKY (GAUZE/BANDAGES/DRESSINGS) ×3 IMPLANT
BOOT STEPPER DURA LG (SOFTGOODS) ×3 IMPLANT
BOOT STEPPER DURA MED (SOFTGOODS) IMPLANT
BOOT STEPPER DURA SM (SOFTGOODS) IMPLANT
BOOT STEPPER DURA XLG (SOFTGOODS) IMPLANT
CLOSURE WOUND 1/2 X4 (GAUZE/BANDAGES/DRESSINGS) ×1
CLOTH BEACON ORANGE TIMEOUT ST (SAFETY) ×3 IMPLANT
COVER LIGHT HANDLE STERIS (MISCELLANEOUS) ×6 IMPLANT
CUFF TOURNIQUET SINGLE 18IN (TOURNIQUET CUFF) ×3 IMPLANT
DECANTER SPIKE VIAL GLASS SM (MISCELLANEOUS) ×3 IMPLANT
DRAPE OEC MINIVIEW 54X84 (DRAPES) IMPLANT
DRSG ADAPTIC 3X8 NADH LF (GAUZE/BANDAGES/DRESSINGS) ×3 IMPLANT
ELECT REM PT RETURN 9FT ADLT (ELECTROSURGICAL) ×3
ELECTRODE REM PT RTRN 9FT ADLT (ELECTROSURGICAL) ×1 IMPLANT
FORMALIN 10 PREFIL 120ML (MISCELLANEOUS) ×6 IMPLANT
GAUZE SPONGE 4X4 12PLY STRL (GAUZE/BANDAGES/DRESSINGS) ×3 IMPLANT
GLOVE BIO SURGEON STRL SZ7.5 (GLOVE) ×3 IMPLANT
GLOVE BIOGEL PI IND STRL 7.0 (GLOVE) ×2 IMPLANT
GLOVE BIOGEL PI INDICATOR 7.0 (GLOVE) ×4
GOWN STRL REUS W/TWL LRG LVL3 (GOWN DISPOSABLE) ×6 IMPLANT
KIT ROOM TURNOVER APOR (KITS) ×3 IMPLANT
MARKER SKIN DUAL TIP RULER LAB (MISCELLANEOUS) ×3 IMPLANT
NEEDLE HYPO 27GX1-1/4 (NEEDLE) ×12 IMPLANT
NS IRRIG 1000ML POUR BTL (IV SOLUTION) ×3 IMPLANT
PACK BASIC LIMB (CUSTOM PROCEDURE TRAY) ×3 IMPLANT
PAD ARMBOARD 7.5X6 YLW CONV (MISCELLANEOUS) ×3 IMPLANT
PAD TELFA 3X4 1S STER (GAUZE/BANDAGES/DRESSINGS) ×3 IMPLANT
SET BASIN LINEN APH (SET/KITS/TRAYS/PACK) ×3 IMPLANT
SOL PREP PROV IODINE SCRUB 4OZ (MISCELLANEOUS) ×3 IMPLANT
SPONGE LAP 18X18 X RAY DECT (DISPOSABLE) ×3 IMPLANT
STRIP CLOSURE SKIN 1/2X4 (GAUZE/BANDAGES/DRESSINGS) ×2 IMPLANT
SUT PROLENE 3 0 PS 2 (SUTURE) ×6 IMPLANT
SUT VICRYL AB 3-0 FS1 BRD 27IN (SUTURE) ×3 IMPLANT
SYR CONTROL 10ML LL (SYRINGE) ×6 IMPLANT
TOWEL OR 17X26 4PK STRL BLUE (TOWEL DISPOSABLE) IMPLANT

## 2016-03-10 NOTE — Anesthesia Preprocedure Evaluation (Signed)
Anesthesia Evaluation  Patient identified by MRN, date of birth, ID band Patient awake    Reviewed: Allergy & Precautions, H&P , NPO status , Patient's Chart, lab work & pertinent test results  History of Anesthesia Complications Negative for: history of anesthetic complications  Airway Mallampati: I  TM Distance: >3 FB     Dental  (+) Teeth Intact, Poor Dentition, Loose,    Pulmonary Current Smoker,    Pulmonary exam normal        Cardiovascular hypertension, Pt. on medications + Peripheral Vascular Disease  + dysrhythmias Atrial Fibrillation  Rhythm:Regular Rate:Normal     Neuro/Psych PSYCHIATRIC DISORDERS Anxiety Depression  Neuromuscular disease    GI/Hepatic   Endo/Other  diabetes, Poorly Controlled, Type 2, Insulin DependentMorbid obesity  Renal/GU      Musculoskeletal  (+) Fibromyalgia -  Abdominal   Peds  Hematology   Anesthesia Other Findings   Reproductive/Obstetrics                             Anesthesia Physical Anesthesia Plan  ASA: III  Anesthesia Plan: MAC   Post-op Pain Management:    Induction: Intravenous  Airway Management Planned: Simple Face Mask  Additional Equipment:   Intra-op Plan:   Post-operative Plan:   Informed Consent: I have reviewed the patients History and Physical, chart, labs and discussed the procedure including the risks, benefits and alternatives for the proposed anesthesia with the patient or authorized representative who has indicated his/her understanding and acceptance.     Plan Discussed with:   Anesthesia Plan Comments:         Anesthesia Quick Evaluation

## 2016-03-10 NOTE — Transfer of Care (Signed)
Immediate Anesthesia Transfer of Care Note  Patient: Michele NettlesCynthia D [name]  Procedure(s) Performed: Procedure(s): PARTIAL AMPUTATION 5TH RAY RIGHT FOOT (Right)  Patient Location: PACU  Anesthesia Type:MAC  Level of Consciousness: awake, alert , oriented and patient cooperative  Airway & Oxygen Therapy: Patient Spontanous Breathing and Patient connected to nasal cannula oxygen  Post-op Assessment: Report given to RN and Post -op Vital signs reviewed and stable  Post vital signs: Reviewed and stable  Last Vitals:  Vitals:   03/10/16 0730 03/10/16 0844  BP: (!) 101/42 113/80  Pulse:    Resp: (!) 25 20  Temp:  36.5 C    Last Pain:  Vitals:   03/10/16 0638  TempSrc: Oral  PainSc: 2       Patients Stated Pain Goal: 7 (03/10/16 16100638)  Complications: No apparent anesthesia complications

## 2016-03-10 NOTE — Brief Op Note (Signed)
BRIEF OPERATIVE NOTE  DATE OF PROCEDURE 03/10/2016  SURGEON Dallas SchimkeBenjamin Ivan Kip Cropp, [place]  OR STAFF Circulator: Nicki Reaperynthia S Wrenn, RN Scrub Person: Lizabeth LeydenBetty M Ashley, RN   PREOPERATIVE DIAGNOSIS 1.  Osteomyelitis fifth ray, right foot 2.  Ulceration, right foot 3.  Diabetes mellitus with peripheral neuropathy  POSTOPERATIVE DIAGNOSIS Same  PROCEDURE Partial fifth ray amputation, right foot  ANESTHESIA Monitor Anesthesia Care   HEMOSTASIS Pneumatic ankle tourniquet set at 250 mmHg  ESTIMATED BLOOD LOSS Minimal (<5 cc)  MATERIALS USED None  INJECTABLES 0.5% Marcaine plain  PATHOLOGY 1.  Aerobic and anaerobic culture 2.  Partial fifth ray, right foot 3.  Wafer of bone from fifth metatarsal for margins  COMPLICATIONS None

## 2016-03-10 NOTE — H&P (Signed)
HISTORY AND PHYSICAL INTERVAL NOTE:  03/10/2016  7:10 AM  [name]  has presented today for surgery, with the diagnosis of osteomyelitis right foot, diabetes mellitus with peripheral neuropathy right foot.  The various methods of treatment have been discussed with the patient.  No guarantees were given.  After consideration of risks, benefits and other options for treatment, the patient has consented to surgery.  I have reviewed the patients' chart and labs.    Patient Vitals for the past 24 hrs:  BP Temp Temp src Pulse Resp SpO2 Height Weight  03/10/16 0638 115/72 98.4 F (36.9 C) Oral (!) 58 20 95 % 5' 7.5" (1.715 m) (!) 301 lb (136.5 kg)    A history and physical examination was performed in my office.  The patient was reexamined.  There have been no changes to this history and physical examination.  [name], DPM

## 2016-03-10 NOTE — Discharge Instructions (Signed)
These instructions will give you an idea of what to expect after surgery and how to manage issues that may arise before your first post op office visit. ° °Pain Management °Pain is best managed by “staying ahead” of it. If pain gets out of control, it is difficult to get it back under control. Local anesthesia that lasts 6-8 hours is used to numb the foot and decrease pain.  For the best pain control, take the pain medication every 4 hours for the first 2 days post op. On the third day pain medication can be taken as needed.  ° °Post Op Nausea °Nausea is common after surgery, so it is managed proactively.  °If prescribed, use the prescribed nausea medication regularly for the first 2 days post op. ° °Bandages °Do not worry if there is blood on the bandage. What looks like a lot of blood on the bandage is actually a small amount. Blood on the dressing spreads out as it is absorbed by the gauze, the same way a drop of water spreads out on a paper towel.  °If the bandages feel wet or dry, stiff and uncomfortable, call the office during office hours and we will schedule a time for you to have the bandage changed.  °Unless you are specifically told otherwise, we will do the first bandage change in the office.  °Keep your bandage dry. If the bandage becomes wet or soiled, notify the office and we will schedule a time to change the bandage. ° °Activity °It is best to spend most of the first 2 days after surgery lying down with the foot elevated above the level of your heart. °You may put weight on your heel while wearing the CAM Walker (black boot).   °You may only get up to go to the restroom. ° °Driving °Do not drive until you are able to respond in an emergency (i.e. slam on the brakes). This usually occurs after the bone has healed - 6 to 8 weeks. ° °Call the Office °If you have a fever over 101°F.  °If you have increasing pain after the initial post op pain has settled down.  °If you have increasing redness, swelling,  or drainage.  °If you have any questions or concerns.  ° ° °PATIENT INSTRUCTIONS °POST-ANESTHESIA ° °IMMEDIATELY FOLLOWING SURGERY:  Do not drive or operate machinery for the first twenty four hours after surgery.  Do not make any important decisions for twenty four hours after surgery or while taking narcotic pain medications or sedatives.  If you develop intractable nausea and vomiting or a severe headache please notify your doctor immediately. ° °FOLLOW-UP:  Please make an appointment with your surgeon as instructed. You do not need to follow up with anesthesia unless specifically instructed to do so. ° °WOUND CARE INSTRUCTIONS (if applicable):  Keep a dry clean dressing on the anesthesia/puncture wound site if there is drainage.  Once the wound has quit draining you may leave it open to air.  Generally you should leave the bandage intact for twenty four hours unless there is drainage.  If the epidural site drains for more than 36-48 hours please call the anesthesia department. ° °QUESTIONS?:  Please feel free to call your physician or the hospital operator if you have any questions, and they will be happy to assist you.    ° ° ° °

## 2016-03-10 NOTE — Anesthesia Postprocedure Evaluation (Signed)
Anesthesia Post Note  Patient: Michele NettlesCynthia D [name]  Procedure(s) Performed: Procedure(s) (LRB): PARTIAL AMPUTATION 5TH RAY RIGHT FOOT (Right)  Patient location during evaluation: PACU Anesthesia Type: MAC Level of consciousness: awake and alert and oriented Pain management: pain level controlled Vital Signs Assessment: post-procedure vital signs reviewed and stable Respiratory status: spontaneous breathing and patient connected to nasal cannula oxygen Cardiovascular status: stable Postop Assessment: no signs of nausea or vomiting Anesthetic complications: no    Last Vitals:  Vitals:   03/10/16 0730 03/10/16 0844  BP: (!) 101/42 113/80  Pulse:    Resp: (!) 25 20  Temp:  36.5 C    Last Pain:  Vitals:   03/10/16 0638  TempSrc: Oral  PainSc: 2                  Chanie Soucek A

## 2016-03-10 NOTE — Op Note (Signed)
OPERATIVE NOTE  DATE OF PROCEDURE 03/10/2016   SURGEON Dallas SchimkeBenjamin Ivan Clive Parcel, [place]  OR STAFF Circulator: Nicki Reaperynthia S Wrenn, RN Scrub Person: Lizabeth LeydenBetty M Ashley, RN   PREOPERATIVE DIAGNOSIS 1.  Osteomyelitis fifth ray, right foot 2.  Ulceration, right foot 3.  Diabetes mellitus with peripheral neuropathy  POSTOPERATIVE DIAGNOSIS Same  PROCEDURE Partial fifth ray amputation, right foot  ANESTHESIA Monitor Anesthesia Care   HEMOSTASIS Pneumatic ankle tourniquet set at 250 mmHg  ESTIMATED BLOOD LOSS Minimal (<5 cc)  MATERIALS USED None  INJECTABLES 0.5% Marcaine plain  PATHOLOGY 1.  Aerobic and anaerobic culture 2.  Partial fifth ray, right foot 3.  Wafer of bone from fifth metatarsal for margins  COMPLICATIONS None  INDICATIONS:  Chronic ulceration of the right foot with erosive changes on plain film radiography.  An MRI was also performed to evaluate the extent of infection.  DESCRIPTION OF THE PROCEDURE:  The patient was brought to the operating room and placed on the operative table in the supine position.  A pneumatic ankle tourniquet was applied to the operative extremity.  Following sedation, the surgical site was anesthetized with 0.5% Marcaine plain.  The foot was then prepped, scrubbed, and draped in the usual sterile technique.  The foot was elevated, exsanguinated and the pneumatic ankle tourniquet inflated to 250 mmHg.    Attention was directed to the lateral aspect of the right foot.  2 converging semielliptical incisions were made encompassing the fifth toe circumferentially.  The incision was continued along the dorsal lateral aspect of the fifth metatarsal.  Dissection was continued deep down to the level of the fifth metatarsal.  The head of the fifth metatarsal was brown in color and fragmented consistent with osteomyelitis.  An osteotomy was performed in normal appearing bone.  The distal fifth metatarsal was freed of all soft tissue attachments, removed  along with the fifth toe and passed from the operative field.  The surgical field was debrided using a #15 blade and scissors to bleeding, viable soft tissue.  The surgical wound was irrigated with copious amounts of sterile irrigant.  A second osteotomy was performed using a power bone saw to resect a small wafer of bonefrom the distal aspect of the residual fifth metatarsal bone for margins.  The surgical wound was irrigated with copious amounts of sterile irrigant.  The skin was reapproximated using Prolene.  A sterile compressive dressing was applied to the operative field.  The pneumatic ankle tourniquet was deflated and a prompt hyperemic response was noted to all remaining digits of the right foot.   The patient tolerated the procedure well.  The patient was then transferred to PACU with vital signs stable and vascular status intact to all toes of the operative foot.

## 2016-03-10 NOTE — Anesthesia Procedure Notes (Signed)
Procedure Name: MAC Date/Time: 03/10/2016 7:30 AM Performed by: Pernell DupreADAMS, Tarhonda Hollenberg A Pre-anesthesia Checklist: Patient identified, Emergency Drugs available, Suction available and Patient being monitored Oxygen Delivery Method: Simple face mask

## 2016-03-13 ENCOUNTER — Other Ambulatory Visit: Payer: Self-pay | Admitting: *Deleted

## 2016-03-13 NOTE — Patient Outreach (Addendum)
[place]oke with [place] via [place] phone to complete transition of care call. See transition of care template for details.  Arline AspCindy had partial fifth ray amputation, right foot on 10/13 for pre-op diagnosis of osteomyelitis of right fifth toe and chronic ulceration . She says her pain is well controlled and she will see her surgeon on 10/17 to have the surgical dressing removed. She say her blood sugars are running 100-103 in the morning. ( Her POC Hgb A1C was 5.5%  on 02/10/16 she it was checked at her primary care MD's office. She has a rocker shoe on her right foot and is not using an assistive device to ambulate. She says she will be out of work for a minim of 6 weeks and may be out up to 3 months.  Advised Arline AspCindy this RNCM will call her next week to assess her ongoing post-op recovery. Bary RichardJanet S. Hauser RN,CCM,CDE [facility] Care Management Coordinator [facility] Office Phone [phone] Office Fax [phone]

## 2016-03-15 LAB — AEROBIC/ANAEROBIC CULTURE (SURGICAL/DEEP WOUND)

## 2016-03-15 LAB — AEROBIC/ANAEROBIC CULTURE W GRAM STAIN (SURGICAL/DEEP WOUND): Culture: NO GROWTH

## 2016-03-16 ENCOUNTER — Encounter (HOSPITAL_COMMUNITY): Payer: Self-pay | Admitting: Podiatry

## 2016-03-16 ENCOUNTER — Other Ambulatory Visit: Payer: Self-pay | Admitting: Family Medicine

## 2016-03-16 MED FILL — dilTIAZem HCL 120 MG TABS: 120 | 90 days supply | Qty: 90 | Fill #3

## 2016-03-16 MED FILL — LISINOPRIL 5 MG TABLET: 5 | 90 days supply | Qty: 45 | Fill #3

## 2016-03-16 NOTE — Telephone Encounter (Signed)
May have this in 2 refills-duplicate

## 2016-03-16 NOTE — Telephone Encounter (Signed)
May have this +2 refills 

## 2016-03-17 MED FILL — LYRICA 100 MG CAPSULE: 100 | 90 days supply | Qty: 180 | Fill #0

## 2016-03-20 ENCOUNTER — Other Ambulatory Visit: Payer: Self-pay | Admitting: *Deleted

## 2016-03-20 ENCOUNTER — Ambulatory Visit: Payer: 59 | Admitting: Skilled Nursing Facility1

## 2016-03-20 NOTE — Patient Outreach (Signed)
Spoke with Michele Aspindy via cell phone to complete 2nd transition of care call. [name] is s/p partial fifth ray amputation, right foot on 10/13 for pre-op diagnosis of osteomyelitis of right fifth toe and chronic ulceration. She states she continues to do well. She says she saw her surgeon last week and received a good report and she will see him again next week. She says she has minimal pain and the incision is healing well.  Advised [name] this RNCM will call her next week to assess her ongoing recovery from surgery. [name] RN,CCM,CDE [facility] Care Management Coordinator [facility] Office Phone [phone] Office Fax [phone]

## 2016-03-27 ENCOUNTER — Other Ambulatory Visit: Payer: Self-pay | Admitting: *Deleted

## 2016-03-27 NOTE — Patient Outreach (Signed)
Spoke with [name] to complete 3rd transition of care phone call to asArline Aspsess her recovery from partial right foot amputation on 03/10/16. [name] says she continues to recovery well, is not requiring prescription pain medication and says the dressing is dry and intact. She says she will see her surgeon tomorrow and he will remove the dressing for the first time.  Advised [name] this RNCM will call her next week to complete the transition of care calls. Bary RichardJanet S. Yann Biehn RN,CCM,CDE [facility] Care Management Coordinator [facility] Office Phone [phone] Office Fax [phone]

## 2016-03-28 DIAGNOSIS — T8130XA Disruption of wound, unspecified, initial encounter: Secondary | ICD-10-CM | POA: Diagnosis not present

## 2016-03-28 DIAGNOSIS — Z4802 Encounter for removal of sutures: Secondary | ICD-10-CM | POA: Diagnosis not present

## 2016-03-30 ENCOUNTER — Ambulatory Visit: Payer: 59 | Admitting: Psychiatry

## 2016-03-30 ENCOUNTER — Other Ambulatory Visit: Payer: 59

## 2016-03-30 ENCOUNTER — Ambulatory Visit (HOSPITAL_BASED_OUTPATIENT_CLINIC_OR_DEPARTMENT_OTHER): Payer: 59 | Admitting: Genetic Counselor

## 2016-03-30 DIAGNOSIS — Z808 Family history of malignant neoplasm of other organs or systems: Secondary | ICD-10-CM

## 2016-03-30 DIAGNOSIS — Z8489 Family history of other specified conditions: Secondary | ICD-10-CM | POA: Diagnosis not present

## 2016-03-30 DIAGNOSIS — Z315 Encounter for genetic counseling: Secondary | ICD-10-CM

## 2016-03-30 DIAGNOSIS — Z803 Family history of malignant neoplasm of breast: Secondary | ICD-10-CM | POA: Diagnosis not present

## 2016-03-30 DIAGNOSIS — Z8542 Personal history of malignant neoplasm of other parts of uterus: Secondary | ICD-10-CM

## 2016-03-30 DIAGNOSIS — Z800 Family history of malignant neoplasm of digestive organs: Secondary | ICD-10-CM

## 2016-03-31 ENCOUNTER — Encounter: Payer: Self-pay | Admitting: Genetic Counselor

## 2016-03-31 DIAGNOSIS — Z8542 Personal history of malignant neoplasm of other parts of uterus: Secondary | ICD-10-CM | POA: Insufficient documentation

## 2016-03-31 NOTE — Progress Notes (Signed)
REFERRING PROVIDER: Kathyrn Drown, MD [address] [place] [place], [place] [address]  PRIMARY PROVIDER:  Sallee Lange, MD  PRIMARY REASON FOR VISIT:  1. History of endometrial cancer   2. Family history of genetic disorder   3. Family history of malignant neoplasm of adrenal gland   4. Family history of breast cancer in female   38. Family history of glioblastoma   6. Family history of liver cancer      HISTORY OF PRESENT ILLNESS:   [name], a 60 y.o. female, was seen for a [place] cancer genetics consultation at the request of Dr. Wolfgang Phoenix due to a personal history of endometrial cancer at 24 and family history of pathogenic SDHC mutation and family history of cancer.  [name] presents to clinic today to discuss the possibility of a hereditary predisposition to cancer, genetic testing, and to further clarify her future cancer risks, as well as potential cancer risks for family members.   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 This was treated with TAH-BSO. [name] reports history of a benign "cold nodule" removed from her thyroid in 1996-1997.  She report no additional personal history of cancer.  HORMONAL RISK FACTORS:  Menarche was at age 60.  First live birth at age - not assessed.  OCP use for approximately 0 years.  Ovaries intact: no.  Hysterectomy: yes, TAH-BSO in 2001 due to endometrial cancer.  Menopausal status: has had a hysterectomy.  HRT use: reports history of "thyroid medicine" for a few months years. Colonoscopy: yes; had first colonoscopy within the last 5 years - no polyps found. Mammogram within the last year: gets every 2-3 years; had needed follow-ups in the past, but follow-ups have been normal. Number of breast biopsies: 0. Up to date with pelvic exams:  yes. Any excessive radiation exposure/other exposures in the past:  Reports history of secondhand smoke exposures;  uses insect control chemicals around the house  Past Medical History:  Diagnosis Date  . Anxiety   . Atrial fibrillation ([place])   . Bilateral carotid artery stenosis 05/06/15   showed 50-69% stenosis bilaterally  . Cancer ([place])    uterine  . Cerebrovascular small vessel disease 04/26/15   per MRA of brain without contrast  . Degenerative joint disease   . Depression   . Diabetes mellitus    niddm  . Dysrhythmia    hx of A Fib  . Fibromyalgia   . History of stress test 2007   NM stress test negative  . Hyperlipidemia   . Hypertension   . Intracranial vascular stenosis 04/26/15   significant vascular stenosis per MRA of brain without contrast  . Neuropathy [facility])     Past Surgical History:  Procedure Laterality Date  . ABDOMINAL HYSTERECTOMY  2000  . AMPUTATION  04/11/2011   Procedure: AMPUTATION DIGIT;  Surgeon: Marcheta Grammes;  Location: [facility];  Service: Orthopedics;  Laterality: Left;  Amputation Second Toe Left Foot  . AMPUTATION Right 03/10/2016   Procedure: PARTIAL AMPUTATION 5TH RAY RIGHT FOOT;  Surgeon: Caprice Beaver, DPM;  Location: [facility];  Service: Podiatry;  Laterality: Right;  . BONE BIOPSY  04/11/2011   Procedure: BONE BIOPSY;  Surgeon: Marcheta Grammes;  Location: [facility];  Service: Orthopedics;  Laterality: Left;  Bone Biopsy Left Foot Third Toe   . CARPAL TUNNEL RELEASE  2003   bilateral   . CHOLECYSTECTOMY  2007  . COLONOSCOPY    . HERNIA REPAIR  2007   incisional hernia repair x2  . KNEE ARTHROSCOPY     left knee-multiple  . KNEE ARTHROSCOPY  2000   right knee  . REPLACEMENT TOTAL KNEE BILATERAL     2006  . THYROID CYST EXCISION  1996   removal of nodule  . TOE AMPUTATION  November 2012  . TUBAL LIGATION  1978    Social History   Social History  . Marital status: Married    Spouse name: N/A  . Number of children: 5  . Years of education: [place]   Occupational History  . [employer]    Social History Main  Topics  . Smoking status: Former Smoker    Packs/day: 0.50    Years: 40.00    Types: Cigarettes    Quit date: 02/27/2016  . Smokeless tobacco: Never Used     Comment: As of 03/30/16, quit in October 2017  . Alcohol use No  . Drug use: No  . Sexual activity: Yes    Birth control/ protection: Surgical   Other Topics Concern  . None   Social History Narrative   Patient drinks about 4 cups of caffeine daily.   Patient is left handed.      FAMILY HISTORY:  We obtained a detailed, 4-generation family history.  Significant diagnoses are listed below: Family History  Problem Relation Age of Onset  . Brain cancer Father     glioblastoma dx. early 58s; d. 55y  . Atrial fibrillation Father   . Diabetes Father   . Bipolar disorder Father   . Heart disease Father   . Osteoarthritis Mother   . Fibromyalgia Mother   . Thyroid disease Mother   . Other Mother     +SDHC mutation  . Cancer Mother     adrenal gland cancer  . Skin cancer Mother     unspecified type  . Fibroids Mother     hx of hysterectomy  . Heart attack Brother     about age 49  . Stroke Brother   . Atrial fibrillation Brother   . Diabetes Brother   . Other Brother     tumor removed from his carotid artery, dx 52-54  . Stroke Sister   . Fibromyalgia Sister   . Thyroid disease Maternal Aunt   . Alzheimer's disease Maternal Aunt     d. 16  . Diabetes Paternal Aunt   . Thyroid disease Paternal Uncle   . Renal cancer Paternal Grandfather     dx. 85-86  . Diabetes Paternal Grandfather   . Heart Problems Paternal Grandfather     d. 76  . Arthritis Brother   . Bipolar disorder Daughter   . Stroke Maternal Grandmother     d. 60  . High blood pressure Maternal Grandfather   . Heart Problems Maternal Grandfather   . Liver cancer Paternal Grandmother     dx. 70; mets to lung, d. 69  . Thyroid disease Maternal Aunt   . Lupus Cousin     maternal 1st cousin  . Breast cancer Other     (x2) paternal great aunts  (PGF's sisters)  . Breast cancer Other     paternal great grandmother (PGF's mother)  . Brain cancer Other 50    paternal great grandfather (PGF's father)  . Anesthesia problems Neg Hx   . Hypotension Neg Hx   . Malignant hyperthermia Neg Hx   .  Pseudochol deficiency Neg Hx     Ms. Hoffart has two daughters and one son.  Her son died in a car accident at 36.  Her oldest daughter is 31 and has never had cancer.  She has a 109 year old daughter of her own.  Ms. Schickling younger daughter is 73 and has never had cancer.  She does have a history of endometriosis for which she underwent a hysterectomy.  This daughter has two daughters and one son, ages 42-22, who have been adopted by Ms. Hardin Negus.  This youngest son has sensory integration disorder, but is now getting ready to graduate from high school.  Ms. Pascual has one full sister and two full brothers, ages 110-59.  Her oldest brother, Nicole Kindred, had a tumor removed from his carotid artery at age 53-54.  Ms. Munoz reports no history of cancer for any of her nieces or nephews.    Ms. Uhlir mother is currently 48.  She was found to have what sounds like a pathogenic SDHC gene mutation, although Ms. Thull does not have a copy of the test report with her today.  Dr. Wolfgang Phoenix reports that this mutation is called "[id number].Y482N".  Ms. Oros mother has a history of a adrenal gland cancer and unspecified age.  She also has a history of fibroids and has had a hysterectomy.  She has one full sister and one maternal half-sister.  Her full sister died from alzheimer's-related illness at 31, and she also has a history of thyroid issues.  Her half-sister is currently 9 and has never had cancer, but also has a history of thyroid issues.  Ms. Witting reports no known history of cancer for any of her maternal first cousins.  Her maternal grandfather died of a massive stroke at 75.  Her grandfather died due to high blood pressure and heart issues at 77.  Ms.  Doren Custard has limited information for her maternal great aunts/uncles and great grandparents.  Ms. Tyree father was diagnosed with a glioblastoma in his early 64s.  He died from brain cancer and heart disease at 36.  He had one full brother and sister, both of whom are in their early 31s and have not had cancer.  Ms. Haseman reports no known history of cancer for her paternal first cousins.  Her paternal grandmother died of liver cancer at 39.  Her grandfather was diagnosed with kidney cancer in his mid87s.  He died of heart trouble at 17.  He had three sisters and two of his sisters had breast cancer.  His mother also had a history of breast cancer, and his father was diagnosed with brain cancer at 36.  Ms. Thorup has no further information for other paternal relatives.  Patient's maternal ancestors are of Caucasian and Native American descent, and paternal ancestors are of [place] and [place] descent. There is no reported Ashkenazi Jewish ancestry. There is no known consanguinity.  GENETIC COUNSELING ASSESSMENT: LINDA GRIMMER is a 60 y.o. female with a personal history of early-onset uterine cancer and family history of pathogenic SDHC mutation and adrenal cancer. We, therefore, discussed and recommended the following at today's visit.   DISCUSSION: We reviewed the characteristics, features and inheritance patterns of hereditary cancer syndromes, particularly those caused by mutations within the [facility] gene and Lynch syndrome genes. We also discussed genetic testing, including the appropriate family members to test, the process of testing, insurance coverage and turn-around-time for results. We discussed the implications of a negative, positive and/or variant of uncertain significant  result. We recommended Ms. Yassin pursue genetic testing for the 42-gene [facility] Common Hereditary Cancers Panel (Breast, Gyn, GI) through [facility].  The 42-gene [facility] Common Hereditary Cancers  Panel (Breast, Gyn, GI) performed by [facility] [place]) includes sequencing and/or deletion/duplication analysis for the following genes: APC, ATM, AXIN2, BARD1, BMPR1A, BRCA1, BRCA2, BRIP1, CDH1, CDKN2A, CHEK2, DICER1, EPCAM, GREM1, KIT, MEN1, MLH1, MSH2, MSH6, MUTYH, NBN, NF1, PALB2, PDGFRA, PMS2, POLD1, POLE, PTEN, RAD50, RAD51C, RAD51D, SDHA, SDHB, SDHC, SDHD, SMAD4, SMARCA4, STK11, TP53, TSC1, TSC2, and VHL.    Based on Ms. Sweeden's personal and family history of cancer, she meets medical criteria for genetic testing. Despite that she meets criteria, she may still have an out of pocket cost. We discussed that if her out of pocket cost for testing is over $100, the laboratory will call and confirm whether she wants to proceed with testing.  If the out of pocket cost of testing is less than $100 she will be billed by the genetic testing laboratory.   PLAN: After considering the risks, benefits, and limitations, Ms. Benninger  provided informed consent to pursue genetic testing and the blood sample was sent to [facility] for analysis of the 42-gene [facility] Common Hereditary Cancers Panel (Breast, Gyn, GI). Results should be available within approximately 2-3 weeks' time, at which point they will be disclosed by telephone to Ms. Laguardia, as will any additional recommendations warranted by these results. Ms. Zunker will receive a summary of her genetic counseling visit and a copy of her results once available. This information will also be available in Epic. We encouraged Ms. Dubose to remain in contact with cancer genetics annually so that we can continuously update the family history and inform her of any changes in cancer genetics and testing that may be of benefit for her family. Ms. Frayne questions were answered to her satisfaction today. Our contact information was provided should additional questions or concerns arise.  Thank you for the referral and allowing  [place] to share in the care of your patient.   Jeanine Luz, MS, [facility] Certified Genetic Counselor [email or web address] Phone: [phone]  The patient was seen for a total of 60 minutes in face-to-face genetic counseling.  This patient was discussed with Drs. Magrinat, Lindi Adie and/or Burr Medico who agrees with the above.    _______________________________________________________________________ For Office Staff:  Number of people involved in session: 2 Was an Intern/ student involved with case: no

## 2016-04-07 ENCOUNTER — Other Ambulatory Visit: Payer: Self-pay | Admitting: *Deleted

## 2016-04-07 NOTE — Patient Outreach (Signed)
Spoke with [place] via phone to complete the last transition of care phone call.  She states she saw her surgeon a week ago, and again this week and her incision is healing without incident and she has been released to return to work on 04/10/16. Advised Arline AspCindy this RNCM will send an e-mail to her [facility] e-mail address with dates and times this RNCM will be at [facility] so that routine Link To Wellness follow up can be arranged. Bary RichardJanet S. Hauser RN,CCM,CDE [facility] Care Management Coordinator Link To Wellness Office Phone [phone] Office Fax [phone]

## 2016-04-24 ENCOUNTER — Other Ambulatory Visit: Payer: Self-pay | Admitting: Family Medicine

## 2016-04-24 ENCOUNTER — Telehealth: Payer: Self-pay | Admitting: Genetic Counselor

## 2016-04-24 MED FILL — LANTUS SOLOSTAR 100 UNITS/M: 100 | 56 days supply | Qty: 45 | Fill #4

## 2016-04-24 NOTE — Telephone Encounter (Signed)
Duplicate

## 2016-04-24 NOTE — Telephone Encounter (Signed)
Discussed with [name] that her genetic test result was negative for mutations within any of 42 genes on the [facility] Common Hereditary Cancers Panel (Breast, Gyn, GI) through [facility].  Additionally, no variants of uncertain significance (VUSes) were found.  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  Discussed that this is a true negative result for us.  We would still like to verify this information with a copy of her mother's test report.  [name] will get us a copy if she is able to.  Discussed that her brothers (and other maternal relatives) still need genetic testing for this familial mutation as there is a 50% chance that they could have inherited this.  Discussed that [name]'s uterine cancer was most likely sporadic, although we can never totally rule out a genetic cause, as our genetic testing may not be perfect at this time.  [name] should continue to follow her doctors' recommendations for future cancer screening.  She knows she is welcome to call or email with any questions she may have.  I have emailed her a copy of her test result.

## 2016-04-24 NOTE — Telephone Encounter (Signed)
May have 90 days of refills

## 2016-04-25 ENCOUNTER — Ambulatory Visit: Payer: Self-pay | Admitting: Genetic Counselor

## 2016-04-25 DIAGNOSIS — Z8489 Family history of other specified conditions: Secondary | ICD-10-CM

## 2016-04-25 DIAGNOSIS — Z809 Family history of malignant neoplasm, unspecified: Secondary | ICD-10-CM

## 2016-04-25 DIAGNOSIS — Z1379 Encounter for other screening for genetic and chromosomal anomalies: Principal | ICD-10-CM

## 2016-04-25 DIAGNOSIS — Z8542 Personal history of malignant neoplasm of other parts of uterus: Secondary | ICD-10-CM

## 2016-04-25 MED FILL — HUMALOG 100 UNITS/ML KWIKPE: 100 | 40 days supply | Qty: 30 | Fill #0

## 2016-05-03 ENCOUNTER — Other Ambulatory Visit: Payer: Self-pay | Admitting: Family Medicine

## 2016-05-03 MED FILL — traMADol HCL 50 MG TABS: 50 | 30 days supply | Qty: 180 | Fill #0

## 2016-05-03 MED FILL — PARoxetine HCL 20 MG TABS: 20 | 90 days supply | Qty: 360 | Fill #0

## 2016-05-03 NOTE — Telephone Encounter (Signed)
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

## 2016-05-04 ENCOUNTER — Ambulatory Visit: Payer: 59 | Admitting: Cardiology

## 2016-05-10 ENCOUNTER — Other Ambulatory Visit: Payer: Self-pay | Admitting: *Deleted

## 2016-05-10 ENCOUNTER — Encounter: Payer: Self-pay | Admitting: *Deleted

## 2016-05-10 VITALS — BP 110/60 | Ht 67.0 in | Wt 307.0 lb

## 2016-05-10 DIAGNOSIS — E11610 Type 2 diabetes mellitus with diabetic neuropathic arthropathy: Secondary | ICD-10-CM

## 2016-05-10 LAB — POCT GLYCOSYLATED HEMOGLOBIN (HGB A1C): Hemoglobin A1C: 6.3

## 2016-05-10 NOTE — Addendum Note (Signed)
Addended by: Bary RichardHAUSER, Edward Trevino S on: 05/10/2016 02:58 PM   Modules accepted: Orders

## 2016-05-10 NOTE — Patient Outreach (Addendum)
[facility] Care Management   05/10/2016  [name] [birth year] [id number]  Michele NettlesCynthia D Hemp is an 60 y.o. female who [facility] Care Management office for routine Link To Wellness follow up for self management assistance with Type II DM, HTN and morbid obesity.  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 She says she wears the walking boot at work to support her ankle that has Charcot deformities. She denies balance issues when she is wearing her  orthotic shoes.  [name] says her husband continues to do well after gastric sleeve surgery on 11/08/15 and as a result of his change in eating behavior, she too is following a more CHO controlled meal plan and is losing weight. She says she plans to have metabolic surgery next year after she accrues more paid annual leave time as she had an unexpected medical leave due to her foot surgery. She says she will have a cardiac work up on 06/21/15 for surgical clearance as she has a history of atrial fibrillation.  She reports her fasting in the low 100s and after meals <170 consistently. She defines her hypoglycemic threshold at <80. She denies any episodes of hypoglycemia. She says she continues to titrate her insulin to match her CHO consumption. She says she saw Dr. Gerda DissLuking in September and her Hgb A1C was very good at 5.5%. [name] is requesting a POC Hgb A1C be assessed today.    Objective:   [name] is wearing a walking boot on there right foot to support her ankle as she has Charcot changes in the ankle.   Review of Systems  Constitutional: Negative.     Physical Exam  Constitutional: She is oriented to person, place, and time. She appears well-developed and well-nourished.  Neurological: She is alert and oriented to person, place, and time.  Skin: Skin is warm and dry.  Psychiatric: She has a  normal mood and affect. Her behavior is normal. Judgment and thought content normal.   Filed Weights   05/10/16 1018  Weight: (!) 307 lb (139.3 kg)   POC Hgb A1C= 6.3% POC 3 hours post meal CBG= 118  Encounter Medications:   Outpatient Encounter Prescriptions as of 11/24/2015  Medication Sig Note  . aspirin 81 MG chewable tablet Chew 1 tablet (81 mg total) by mouth daily.   Marland Kitchen. diltiazem (CARDIZEM) 120 MG tablet TAKE 1 TABLET BY MOUTH DAILY   . HUMALOG KWIKPEN 100 UNIT/ML KiwkPen INJECT 20-25 UNITS WITH MEALS 05/10/2016: Reports taking 20 units before meals  . ibuprofen (ADVIL,MOTRIN) 800 MG tablet Take 1 tablet (800 mg total) by mouth daily.   . Insulin Glargine (LANTUS SOLOSTAR) 100 UNIT/ML Solostar Pen 30 UNITS TWO TIMES A DAY. MAY TITRATE UP TO 80 UNITS NIGHTLY 11/24/2015: 25 units twice daily  . INVOKANA 300 MG TABS tablet TAKE 1 TABLET BY MOUTH EVERY MORNING.   Marland Kitchen. lisinopril (PRINIVIL,ZESTRIL) 5 MG tablet TAKE 1/2 TABLET BY MOUTH ONCE DAILY   . LYRICA 100 MG capsule TAKE 1 CAPSULE BY MOUTH TWICE DAILY   . Multiple Vitamin (MULTIVITAMIN) tablet Take 1 tablet by mouth daily.   Marland Kitchen. PARoxetine (PAXIL) 20 MG tablet TAKE 4 TABLETS BY MOUTH EVERY MORNING   . traMADol (ULTRAM) 50 MG tablet Take 2 tablets (100 mg total) by mouth every 8 (eight) hours as needed. for pain    No  facility-administered encounter medications on file as of 11/24/2015.    Functional Status:   In your present state of health, do you have any difficulty performing the following activities: 03/09/2016 11/24/2015  Hearing? N N  Vision? N N  Difficulty concentrating or making decisions? N N  Walking or climbing stairs? N N  Dressing or bathing? N N  Doing errands, shopping? N N  Some recent data might be hidden    Fall/Depression Screening:    PHQ 2/9 Scores 11/24/2015 04/05/2015 09/21/2014 03/19/2014  PHQ - 2 Score 2 3 2  0  PHQ- 9 Score 7 8 9  -    Assessment:  [employer] employee and Link To Wellness member with  morbid obesity, Type II DM and HTN,    Plan:  [facility] Problem One        Most Recent Value   Care Plan Problem One  Morbid obesity (BMI= 48.2), Type II DM currently meeting Hgb A1C target of <7.0% as evidenced by POC Hgb A1C= 6.3% today, HTN with consistent blood pressure readings of <130/<80, elevated total cholesterol and LDL on panel drawn 04/09/15 but intolerant to statins   Role Documenting the Problem One  Care Management Coordinator   Care Plan for Problem One  Active   [facility] Long Term Goal (31-90 days)  Ongoing good glycemic control as evidenced by Hgb A1C<7.0% with no reported episodes of blood sugar <60 at/by next link To Wellness visit, continued good control of HTN as evidenced by consistent blood pressure readings of <130/<80 and ongoing weight loss or no weight gain   [facility] Long Term Goal Start Date  05/10/16   Interventions for Problem One Long Term Goal  Discussed [name]'s recovery from foot surgery,  reviewed medication list and assessed adherence,  reinforced importance of taking all medications as prescribed, reviewed blood sugar readings and reviewed targets for fasting and post meal,  assessed POC CBG and Hgb A1C, discussed results and targets and correlation of Hgb A1C to estimated blood glucose, updated [name] on new guidelines for blood pressure targets of <130/<80, reviewed upcoming appointments with healthcare providers, will arrange for  Link To Wellness follow up March 2018 as [name] is not eligible for the [facility] disease management platform because she does not have a smartphone      RNCM to fax today's office visit note to Dr. Gerda DissLuking.  RNCM will meet quarterly and as needed with patient per Link To Wellness program guidelines to assist with Type II DM, HTN, hyperlipidemia and morbid obesity self-management and assess patient's progress toward mutually set goals.  Bary RichardJanet S. Story Vanvranken RN,CCM,CDE [facility] Care Management Coordinator Link To  Wellness Office Phone [phone] Office Fax [phone]

## 2016-05-14 DIAGNOSIS — Z1379 Encounter for other screening for genetic and chromosomal anomalies: Principal | ICD-10-CM

## 2016-05-14 NOTE — Progress Notes (Signed)
GENETIC TEST RESULT  HPI: [name] was previously seen in the [facility] due to a known family history of a pathogenic SDHC mutation, personal history of uterine cancer at age 60, and family history of adrenal gland cancer and other cancers, and concerns regarding a hereditary predisposition to cancer. Please refer to our prior cancer genetics clinic note from March 30, 2016 for more information regarding [name]'s medical, social and family histories, and our assessment and recommendations, at the time. [name]'s recent genetic test results were disclosed to her, as were recommendations warranted by these results. These results and recommendations are discussed in more detail below.  GENETIC TEST RESULTS: Genetic testing reported out on April 22, 2016 through the 42-gene [facility] Common Hereditary Cancers Panel (Breast, Gyn, GI) found no deleterious mutations.  Additionally, no variants of uncertain significance (VUSes) were found. The test report will be scanned into EPIC and will be located under the Molecular Pathology section of the Results Review tab.   We discussed with [name] that since the current genetic testing is not perfect, it is possible there may be a gene mutation in one of these genes that current testing cannot detect, but that chance is small. We also discussed, that it is possible that another gene that has not yet been discovered, or that we have not yet tested, is responsible for the cancer diagnoses in the family, and it is, therefore, important to remain in touch with cancer genetics in the future so that we can continue to offer [name] the most up-to-date genetic testing.   KNOWN FAMILIAL MUTATION: One of our primary concerns in recommending [name] pursue genetic testing was due to the known family history of a pathogenic SDHC gene mutation called "[id number] ([id number])", previously identified in [name]'s mother.    [name]'s test was normal and did not reveal the familial mutation. We call this result a true negative result in the [facility] gene because the cancer-causing mutation was identified in [name]'s family, and she did not inherit it.  Given this negative result, [name]'s chances of developing SDHC-related cancers are the same as they are in the general population.  We would still like to just verify this with a copy of [name]'s mother's report.  We encouraged her to get a copy and share this with [name].  CANCER SCREENING RECOMMENDATIONS: We still do not have an explanation for [name]'s early-onset uterine cancer or for the family history of other cancers outside of that explained by the familial SDHC gene mutation.  This result is reassuring and indicates that [name] likely does not have an increased risk for a future cancer due to a mutation in one of these genes. This normal test also suggests that [name]'s cancer was most likely not due to an inherited predisposition associated with one of these genes.  Most cancers happen by chance and this negative test suggests that her cancer falls into this category.  We, therefore, recommended she continue to follow the cancer management and screening guidelines provided by her oncology and primary healthcare providers.   RECOMMENDATIONS FOR FAMILY MEMBERS: Men and women in this family might be at some increased risk of developing cancer, over the general population risk, simply due to the family history of cancer. We recommended women in this family have a yearly mammogram beginning at age 60, or 7210 years younger than the earliest onset of cancer, an annual clinical breast exam, and  perform monthly breast self-exams. Women in this family should also have a gynecological exam as recommended by their primary provider. All family members should have a colonoscopy by age 150.  (Men and women in the maternal family who are found to have  inherited the known familial SDHC mutation will need to follow specialist recommendations for medical management and cancer screening due to their higher tumor/cancer risks.)  Based on [name]'s family history of the SDHC gene mutation in her mother, we recommended her siblings and other maternal relatives undergo genetic counseling and testing to determine whether or not they have inherited the familial SDHC mutation. [name] will let [name] know if we can be of any assistance in coordinating genetic counseling and/or testing for these family members.   FOLLOW-UP: Lastly, we discussed with [name] that cancer genetics is a rapidly advancing field and it is possible that new genetic tests will be appropriate for her and/or her family members in the future. We encouraged her to remain in contact with cancer genetics on an annual basis so we can update her personal and family histories and let her know of advances in cancer genetics that may benefit this family.   Our contact number was provided. [name]'s questions were answered to her satisfaction, and she knows she is welcome to call [name] at anytime with additional questions or concerns.   Vance PeperKayla Quorra Rosene, MS, [facility] Certified Genetic Counselor [email or web address] Phone: [phone]

## 2016-05-18 MED FILL — JARDIANCE 25 MG TABLET: 25 | 90 days supply | Qty: 90 | Fill #1

## 2016-05-18 MED FILL — UNIFINE PENTIPS 31GX3/16": 31G X 5 MM | 30 days supply | Qty: 100 | Fill #1

## 2016-05-18 MED FILL — UNIFINE PENTIPS 31GX3/16: 31G X 5 MM | 30 days supply | Qty: 100 | Fill #1

## 2016-05-30 DIAGNOSIS — B351 Tinea unguium: Secondary | ICD-10-CM | POA: Diagnosis not present

## 2016-05-30 DIAGNOSIS — E1142 Type 2 diabetes mellitus with diabetic polyneuropathy: Secondary | ICD-10-CM | POA: Diagnosis not present

## 2016-05-31 ENCOUNTER — Ambulatory Visit (INDEPENDENT_AMBULATORY_CARE_PROVIDER_SITE_OTHER): Payer: 59 | Admitting: Family Medicine

## 2016-05-31 ENCOUNTER — Encounter: Payer: Self-pay | Admitting: Family Medicine

## 2016-05-31 VITALS — BP 150/88 | Ht 67.75 in | Wt 309.2 lb

## 2016-05-31 DIAGNOSIS — E1140 Type 2 diabetes mellitus with diabetic neuropathy, unspecified: Secondary | ICD-10-CM | POA: Diagnosis not present

## 2016-05-31 DIAGNOSIS — I10 Essential (primary) hypertension: Secondary | ICD-10-CM | POA: Diagnosis not present

## 2016-05-31 DIAGNOSIS — E784 Other hyperlipidemia: Secondary | ICD-10-CM

## 2016-05-31 DIAGNOSIS — R400 Somnolence: Secondary | ICD-10-CM

## 2016-05-31 DIAGNOSIS — E7849 Other hyperlipidemia: Secondary | ICD-10-CM

## 2016-05-31 MED ORDER — INSULIN LISPRO 100 UNIT/ML (KWIKPEN): PEN_INJECTOR | SUBCUTANEOUS | 3 refills | Status: DC

## 2016-05-31 MED ORDER — DILTIAZEM HCL 120 MG PO TABS: 120.0000 mg | ORAL_TABLET | Freq: Every day | ORAL | 5 refills | Status: AC

## 2016-05-31 MED ORDER — INSULIN GLARGINE 100 UNIT/ML SOLOSTAR PEN: PEN_INJECTOR | SUBCUTANEOUS | 5 refills | Status: AC

## 2016-05-31 MED ORDER — PREGABALIN 100 MG PO CAPS: 100.0000 mg | ORAL_CAPSULE | Freq: Two times a day (BID) | ORAL | 1 refills | Status: DC

## 2016-05-31 MED ORDER — LISINOPRIL 5 MG PO TABS: 2.5000 mg | ORAL_TABLET | Freq: Every day | ORAL | 3 refills | Status: AC

## 2016-05-31 MED ORDER — MODAFINIL 200 MG PO TABS: 200.0000 mg | ORAL_TABLET | Freq: Every day | ORAL | 1 refills | Status: DC

## 2016-05-31 MED FILL — dilTIAZem HCL 120 MG TABS: 120 | 90 days supply | Qty: 90 | Fill #0

## 2016-05-31 MED FILL — LANTUS SOLOSTAR 100 UNITS/M: 100 | 28 days supply | Qty: 30 | Fill #0

## 2016-05-31 MED FILL — HUMALOG 100 UNITS/ML KWIKPE: 100 | 60 days supply | Qty: 15 | Fill #0

## 2016-05-31 MED FILL — LISINOPRIL 5 MG TABLET: 5 | 90 days supply | Qty: 45 | Fill #0

## 2016-05-31 NOTE — Progress Notes (Signed)
   Subjective:    Patient ID: [name], female    DOB: [birth year], 61 y.o.   MRN: [id number]  Diabetes  She presents for her follow-up diabetic visit. She has type 2 diabetes mellitus. There are no hypoglycemic associated symptoms. Pertinent negatives for hypoglycemia include no confusion. There are no diabetic associated symptoms. Pertinent negatives for diabetes include no chest pain, no fatigue, no polydipsia, no polyphagia and no weakness. There are no hypoglycemic complications. There are no diabetic complications. There are no known risk factors for coronary artery disease. Current diabetic treatment includes insulin injections.   Patient had A1C done on 05/10/16 and it was 6.3.  Patient states that she wants to discuss a medication to keep her from sleeping all the time.  Patient states that she has had right foot surgery back in October 2017.   Review of Systems  Constitutional: Negative for activity change, appetite change and fatigue.  HENT: Negative for congestion.   Respiratory: Negative for cough.   Cardiovascular: Negative for chest pain.  Gastrointestinal: Negative for abdominal pain.  Endocrine: Negative for polydipsia and polyphagia.  Neurological: Negative for weakness.  Psychiatric/Behavioral: Negative for confusion.       Objective:   Physical Exam  Constitutional: She appears well-nourished. No distress.  Cardiovascular: Normal rate, regular rhythm and normal heart sounds.   No murmur heard. Pulmonary/Chest: Effort normal and breath sounds normal. No respiratory distress.  Musculoskeletal: She exhibits no edema.  Lymphadenopathy:    She has no cervical adenopathy.  Neurological: She is alert. She exhibits normal muscle tone.  Psychiatric: Her behavior is normal.  Vitals reviewed.    25 minutes was spent with the patient. Greater than half the time was spent in discussion and answering questions and counseling regarding the issues that the patient  came in for today.      Assessment & Plan:  Medications were updated. Severe somnolence-referral for sleep study-Provigil 200 mg 1 daily this is helped her in the past Depression under good control continue current measures Painful neuropathy uses tramadol when necessary not abusing it Lyrica helps with the neuropathy Taking her blood pressure medicine blood pressure under good control Diabetes A1c looks under good control taking Jardiance as well as insulin Patient is also taking 81 mg aspirin tolerating this to reduce her risk of stroke and heart attack Morbid obesity discuss importance of exercise try to lose weight Patient is taking about gastric bypass surgery more specifically gastric sleeve January 2019 Patient will do her lab work follow-up in 6 months

## 2016-06-01 MED FILL — MODAFINIL 200 MG TABLET: 200 | 90 days supply | Qty: 90 | Fill #0

## 2016-06-02 DIAGNOSIS — E1140 Type 2 diabetes mellitus with diabetic neuropathy, unspecified: Secondary | ICD-10-CM | POA: Diagnosis not present

## 2016-06-02 DIAGNOSIS — I10 Essential (primary) hypertension: Secondary | ICD-10-CM | POA: Diagnosis not present

## 2016-06-02 DIAGNOSIS — E784 Other hyperlipidemia: Secondary | ICD-10-CM | POA: Diagnosis not present

## 2016-06-03 LAB — LIPID PANEL
CHOL/HDL RATIO: 4.3 ratio (ref 0.0–4.4)
Cholesterol, Total: 239 mg/dL — ABNORMAL HIGH (ref 100–199)
HDL: 55 mg/dL (ref >39)
LDL Calculated: 144 mg/dL — ABNORMAL HIGH (ref 0–99)
Triglycerides: 202 mg/dL — ABNORMAL HIGH (ref 0–149)
VLDL CHOLESTEROL CAL: 40 mg/dL (ref 5–40)

## 2016-06-03 LAB — BASIC METABOLIC PANEL
BUN/Creatinine Ratio: 34 — ABNORMAL HIGH (ref 12–28)
BUN: 20 mg/dL (ref 8–27)
CO2: 25 mmol/L (ref 18–29)
Calcium: 10.9 mg/dL — ABNORMAL HIGH (ref 8.7–10.3)
Chloride: 103 mmol/L (ref 96–106)
Creatinine, Ser: 0.58 mg/dL (ref 0.57–1.00)
GFR calc Af Amer: 116 mL/min/{1.73_m2} (ref >59)
GFR, EST NON AFRICAN AMERICAN: 101 mL/min/{1.73_m2} (ref >59)
Glucose: 110 mg/dL — ABNORMAL HIGH (ref 65–99)
POTASSIUM: 4.5 mmol/L (ref 3.5–5.2)
SODIUM: 141 mmol/L (ref 134–144)

## 2016-06-03 LAB — HEPATIC FUNCTION PANEL
ALT: 21 IU/L (ref 0–32)
AST: 20 IU/L (ref 0–40)
Albumin: 4.2 g/dL (ref 3.6–4.8)
Alkaline Phosphatase: 104 IU/L (ref 39–117)
Bilirubin Total: 0.2 mg/dL (ref 0.0–1.2)
Bilirubin, Direct: 0.08 mg/dL (ref 0.00–0.40)
Total Protein: 7.1 g/dL (ref 6.0–8.5)

## 2016-06-03 LAB — MICROALBUMIN / CREATININE URINE RATIO
Creatinine, Urine: 81.4 mg/dL
Microalb/Creat Ratio: 314.3 mg/g creat — ABNORMAL HIGH (ref 0.0–30.0)
Microalbumin, Urine: 255.8 ug/mL

## 2016-06-05 ENCOUNTER — Other Ambulatory Visit: Payer: Self-pay | Admitting: *Deleted

## 2016-06-07 ENCOUNTER — Encounter: Payer: Self-pay | Admitting: Family Medicine

## 2016-06-13 LAB — PTH, INTACT AND CALCIUM
Calcium: 10.2 mg/dL (ref 8.7–10.3)
PTH: 84 pg/mL — ABNORMAL HIGH (ref 15–65)

## 2016-06-13 LAB — VITAMIN D 25 HYDROXY (VIT D DEFICIENCY, FRACTURES): VIT D 25 HYDROXY: 11.4 ng/mL — AB (ref 30.0–100.0)

## 2016-06-19 ENCOUNTER — Other Ambulatory Visit: Payer: Self-pay | Admitting: *Deleted

## 2016-06-19 DIAGNOSIS — E559 Vitamin D deficiency, unspecified: Secondary | ICD-10-CM

## 2016-06-19 MED ORDER — VITAMIN D (ERGOCALCIFEROL) 1.25 MG (50000 UNIT) PO CAPS: 50000.0000 [IU] | ORAL_CAPSULE | ORAL | 1 refills | Status: DC

## 2016-06-19 MED FILL — VIT D2 1.25 MG (50,000 UNIT: 1.25 MG | 28 days supply | Qty: 4 | Fill #0

## 2016-06-20 ENCOUNTER — Ambulatory Visit: Payer: 59 | Admitting: Cardiology

## 2016-06-22 MED FILL — traMADol HCL 50 MG TABS: 50 | 30 days supply | Qty: 180 | Fill #1

## 2016-07-11 ENCOUNTER — Telehealth: Payer: Self-pay | Admitting: Family Medicine

## 2016-07-11 MED FILL — LYRICA 100 MG CAPSULE: 100 | 90 days supply | Qty: 180 | Fill #1

## 2016-07-11 MED FILL — VIT D2 1.25 MG (50,000 UNIT: 1.25 MG | 28 days supply | Qty: 4 | Fill #1

## 2016-07-11 NOTE — Telephone Encounter (Signed)
Pt dropped off a placard form to be filled out. Form is in folder in Dr. Isidore Moosffice.

## 2016-07-12 NOTE — Telephone Encounter (Signed)
done

## 2016-08-08 DIAGNOSIS — E1142 Type 2 diabetes mellitus with diabetic polyneuropathy: Secondary | ICD-10-CM | POA: Diagnosis not present

## 2016-08-08 DIAGNOSIS — B351 Tinea unguium: Secondary | ICD-10-CM | POA: Diagnosis not present

## 2016-08-08 MED FILL — PARoxetine HCL 20 MG TABS: 20 | 90 days supply | Qty: 360 | Fill #1

## 2016-08-21 MED FILL — LANTUS SOLOSTAR 100 UNITS/M: 100 | 28 days supply | Qty: 30 | Fill #1

## 2016-08-21 MED FILL — HUMALOG 100 UNITS/ML KWIKPE: 100 | 20 days supply | Qty: 15 | Fill #1

## 2016-08-31 MED FILL — traMADol HCL 50 MG TABS: 50 | 30 days supply | Qty: 180 | Fill #2

## 2016-09-04 ENCOUNTER — Other Ambulatory Visit: Payer: Self-pay | Admitting: *Deleted

## 2016-09-04 VITALS — BP 116/65 | Ht 70.0 in | Wt 311.0 lb

## 2016-09-04 DIAGNOSIS — E1169 Type 2 diabetes mellitus with other specified complication: Secondary | ICD-10-CM

## 2016-09-04 DIAGNOSIS — Z794 Long term (current) use of insulin: Principal | ICD-10-CM

## 2016-09-04 LAB — POCT GLYCOSYLATED HEMOGLOBIN (HGB A1C): Hemoglobin A1C: 6.7

## 2016-09-04 NOTE — Patient Outreach (Addendum)
[facility]) Care Management   09/04/2016  [name] Sep 14, 1955 [id number]  [name] is an 61 y.o. female who presents to the [facility] Care Management office for routine Link To Wellness follow up for self management assistance with Type II DM, HTN and morbid obesity.  Subjective: [name] says her Charcot foot deformity is giving her more problems.She plans to work for 5 more years and says she needs something to help her tolerate her long work hours on her feet. She says she still plans on pursuing gastric sleeve but will likely have it in 2019 as she doesn't have enough accrued paid time off to have the surgery in this benefit year.   She reports her fasting variance as <150. She says she saw Dr. Gerda Diss in January.  [name] is requesting a POC Hgb A1C be assessed today.    Objective:     Review of Systems  Constitutional: Negative.     Physical Exam  Constitutional: She is oriented to person, place, and time. She appears well-developed and well-nourished.  Neurological: She is alert and oriented to person, place, and time.  Skin: Skin is warm and dry.  Psychiatric: She has a normal mood and affect. Her behavior is normal. Judgment and thought content normal.   Filed Weights   09/04/16 1121  Weight: (!) 311 lb (141.1 kg)   BP= 116/65- large cuff POC Hgb A1C= 6.7%  Encounter Medications:   Outpatient Encounter Prescriptions as of 11/24/2015  Medication Sig Note  . aspirin 81 MG chewable tablet Chew 1 tablet (81 mg total) by mouth daily.   Marland Kitchen diltiazem (CARDIZEM) 120 MG tablet TAKE 1 TABLET BY MOUTH DAILY   . HUMALOG KWIKPEN 100 UNIT/ML KiwkPen INJECT 20-25 UNITS WITH MEALS 05/10/2016: Reports taking 20 units before meals  . ibuprofen (ADVIL,MOTRIN) 800 MG tablet Take 1 tablet (800 mg total) by mouth daily.   . Insulin Glargine (LANTUS SOLOSTAR) 100 UNIT/ML Solostar Pen 30 UNITS TWO TIMES A DAY. MAY TITRATE UP TO 80  UNITS NIGHTLY 11/24/2015: 25 units twice daily  . INVOKANA 300 MG TABS tablet TAKE 1 TABLET BY MOUTH EVERY MORNING.   Marland Kitchen lisinopril (PRINIVIL,ZESTRIL) 5 MG tablet TAKE 1/2 TABLET BY MOUTH ONCE DAILY   . LYRICA 100 MG capsule TAKE 1 CAPSULE BY MOUTH TWICE DAILY   . Multiple Vitamin (MULTIVITAMIN) tablet Take 1 tablet by mouth daily.   Marland Kitchen PARoxetine (PAXIL) 20 MG tablet TAKE 4 TABLETS BY MOUTH EVERY MORNING   . traMADol (ULTRAM) 50 MG tablet Take 2 tablets (100 mg total) by mouth every 8 (eight) hours as needed. for pain    No facility-administered encounter medications on file as of 11/24/2015.    Functional Status:   In your present state of health, do you have any difficulty performing the following activities: 03/09/2016 11/24/2015  Hearing? N N  Vision? N N  Difficulty concentrating or making decisions? N N  Walking or climbing stairs? N N  Dressing or bathing? N N  Doing errands, shopping? N N  Some recent data might be hidden    Fall/Depression Screening:    PHQ 2/9 Scores 05/31/2016 11/24/2015 04/05/2015 09/21/2014 03/19/2014  PHQ - 2 Score 0 0  PHQ- 9 Score - -    Assessment:  [employer] employee and Link To Wellness member with morbid obesity, Type II DM and HTN,    Plan:  [facility] CM Care Plan Problem One  Most Recent Value   Care Plan Problem One  Morbid obesity (BMI= 44.7), Type II DM currently meeting Hgb A1C target of <7.0% as evidenced by POC Hgb A1C= 6.7% today, HTN with consistent blood pressure readings of <140/<90, lipid panel of 06/02/16 showed elevated total cholesterol, triglycerides and LDL but intolerant to statins   Role Documenting the Problem One  Care Management Coordinator   Care Plan for Problem One  Active   [facility] Long Term Goal (31-90 days)  Ongoing good glycemic control as evidenced by Hgb A1C<7.0% with no reported episodes of blood sugar <60 at/by next Link To Wellness visit, continued good control of HTN as evidenced by consistent blood pressure  readings of <140/<90, improved lipid panel at next assessment and ongoing weight loss or no weight gain   [facility] Long Term Goal Start Date  09/04/16   Interventions for Problem One Long Term Goal  Reviewed medication list and assessed adherence,  reinforced importance of taking all medications as prescribed, reviewed blood sugar readings and reviewed targets for fasting and post meal,  assessed POC Hgb A1C and discussed results and targets and correlation of Hgb A1C to estimated blood glucose, updated [name] on ADA guidelines for blood pressure targets of <140/<90, provided written information on CROW shoe for Charcot deformity and encouraged her to speak with her podiatrist at her next appointment,  arranged for  Link To Wellness follow up in September 2018 as [name] is not eligible for the [facility] disease management platform because she does not have a smartphone      RNCM to fax today's office visit note to Dr. Gerda Diss.  RNCM will meet quarterly and as needed with patient per Link To Wellness program guidelines to assist with Type II DM, HTN, hyperlipidemia and morbid obesity self-management and assess patient's progress toward mutually set goals.  Bary Richard RN,CCM,CDE [facility] Care Management Coordinator Link To Wellness Office Phone [phone] Office Fax [phone]

## 2016-09-05 ENCOUNTER — Encounter: Payer: Self-pay | Admitting: Family Medicine

## 2016-09-11 MED FILL — LISINOPRIL 5 MG TABLET: 5 | 90 days supply | Qty: 45 | Fill #1

## 2016-09-11 MED FILL — MODAFINIL 200 MG TABLET: 200 | 90 days supply | Qty: 90 | Fill #1

## 2016-09-11 MED FILL — JARDIANCE 25 MG TABLET: 25 | 30 days supply | Qty: 30 | Fill #2

## 2016-09-11 MED FILL — dilTIAZem HCL 120 MG TABS: 120 | 90 days supply | Qty: 90 | Fill #1

## 2016-09-20 ENCOUNTER — Emergency Department (HOSPITAL_COMMUNITY)
Admission: EM | Admit: 2016-09-20 | Discharge: 2016-09-20 | Disposition: A | Discharge status: Home / Self Care | Payer: 59 | Attending: Emergency Medicine | Admitting: Emergency Medicine

## 2016-09-20 ENCOUNTER — Emergency Department (HOSPITAL_COMMUNITY): Payer: 59

## 2016-09-20 ENCOUNTER — Encounter (HOSPITAL_COMMUNITY): Payer: Self-pay | Admitting: Cardiology

## 2016-09-20 DIAGNOSIS — Z7982 Long term (current) use of aspirin: Secondary | ICD-10-CM | POA: Insufficient documentation

## 2016-09-20 DIAGNOSIS — R0602 Shortness of breath: Secondary | ICD-10-CM | POA: Diagnosis not present

## 2016-09-20 DIAGNOSIS — E119 Type 2 diabetes mellitus without complications: Secondary | ICD-10-CM | POA: Diagnosis not present

## 2016-09-20 DIAGNOSIS — R0981 Nasal congestion: Secondary | ICD-10-CM | POA: Diagnosis not present

## 2016-09-20 DIAGNOSIS — I10 Essential (primary) hypertension: Secondary | ICD-10-CM | POA: Insufficient documentation

## 2016-09-20 DIAGNOSIS — Z794 Long term (current) use of insulin: Secondary | ICD-10-CM | POA: Insufficient documentation

## 2016-09-20 DIAGNOSIS — Z87891 Personal history of nicotine dependence: Secondary | ICD-10-CM | POA: Diagnosis not present

## 2016-09-20 DIAGNOSIS — R079 Chest pain, unspecified: Secondary | ICD-10-CM

## 2016-09-20 DIAGNOSIS — R0789 Other chest pain: Secondary | ICD-10-CM | POA: Insufficient documentation

## 2016-09-20 DIAGNOSIS — Z8541 Personal history of malignant neoplasm of cervix uteri: Secondary | ICD-10-CM | POA: Diagnosis not present

## 2016-09-20 HISTORY — DX: Type 2 diabetes mellitus with diabetic neuropathic arthropathy: E11.610

## 2016-09-20 LAB — CBC WITH DIFFERENTIAL/PLATELET
BASOS PCT: 0 %
Basophils Absolute: 0 10*3/uL (ref 0.0–0.1)
EOS ABS: 0.3 10*3/uL (ref 0.0–0.7)
EOS PCT: 2 %
HCT: 43.7 % (ref 36.0–46.0)
HEMOGLOBIN: 14.9 g/dL (ref 12.0–15.0)
LYMPHS ABS: 3.2 10*3/uL (ref 0.7–4.0)
LYMPHS PCT: 29 %
MCH: 33 pg (ref 26.0–34.0)
MCHC: 34.1 g/dL (ref 30.0–36.0)
MCV: 96.9 fL (ref 78.0–100.0)
MONO ABS: 0.7 10*3/uL (ref 0.1–1.0)
MONOS PCT: 6 %
NEUTROS ABS: 7 10*3/uL (ref 1.7–7.7)
NEUTROS PCT: 63 %
PLATELETS: 204 10*3/uL (ref 150–400)
RBC: 4.51 MIL/uL (ref 3.87–5.11)
RDW: 13.5 % (ref 11.5–15.5)
WBC: 11.3 10*3/uL — ABNORMAL HIGH (ref 4.0–10.5)

## 2016-09-20 LAB — TROPONIN I: Troponin I: <0.03 ng/mL (ref <0.03)

## 2016-09-20 LAB — BASIC METABOLIC PANEL
Anion gap: 9 (ref 5–15)
BUN: 24 mg/dL — AB (ref 6–20)
CALCIUM: 10.3 mg/dL (ref 8.9–10.3)
CO2: 22 mmol/L (ref 22–32)
CREATININE: 0.7 mg/dL (ref 0.44–1.00)
Chloride: 107 mmol/L (ref 101–111)
GFR calc Af Amer: >60 mL/min (ref >60)
GFR calc non Af Amer: >60 mL/min (ref >60)
GLUCOSE: 155 mg/dL — AB (ref 65–99)
Potassium: 4.1 mmol/L (ref 3.5–5.1)
SODIUM: 138 mmol/L (ref 135–145)

## 2016-09-20 LAB — BRAIN NATRIURETIC PEPTIDE: B Natriuretic Peptide: 199 pg/mL — ABNORMAL HIGH (ref 0.0–100.0)

## 2016-09-20 MED ORDER — FUROSEMIDE 20 MG PO TABS: 20.0000 mg | ORAL_TABLET | Freq: Every day | ORAL | 0 refills | Status: DC

## 2016-09-20 MED ORDER — IBUPROFEN 800 MG PO TABS
800.0000 mg | ORAL_TABLET | Freq: Once | ORAL | Status: AC
  Administered 2016-09-20: 800 mg via ORAL
  Filled 2016-09-20: qty 1

## 2016-09-20 MED ORDER — FUROSEMIDE 10 MG/ML IJ SOLN
40.0000 mg | Freq: Once | INTRAMUSCULAR | Status: AC
  Administered 2016-09-20: 40 mg via INTRAVENOUS
  Filled 2016-09-20: qty 4

## 2016-09-20 MED ORDER — IPRATROPIUM-ALBUTEROL 0.5-2.5 (3) MG/3ML IN SOLN
3.0000 mL | RESPIRATORY_TRACT | Status: DC
  Administered 2016-09-20: 3 mL via RESPIRATORY_TRACT
  Filled 2016-09-20: qty 3

## 2016-09-20 MED ORDER — IPRATROPIUM-ALBUTEROL 0.5-2.5 (3) MG/3ML IN SOLN
3.0000 mL | Freq: Once | RESPIRATORY_TRACT | Status: AC
  Administered 2016-09-20: 3 mL via RESPIRATORY_TRACT
  Filled 2016-09-20: qty 3

## 2016-09-20 NOTE — ED Provider Notes (Signed)
[facility] Provider Note   CSN: [id number] Arrival date & time: 09/20/16  1203  By signing my name below, I, Majel Homer, attest that this documentation has been prepared under the direction and in the presence of Marily Memos, MD . Electronically Signed: Majel Homer, Scribe. 09/20/2016. 12:40 PM.  History   Chief Complaint Chief Complaint  Patient presents with  . Chest Pain   The history is provided by the patient. No language interpreter was used.   HPI Comments: [name] is a 61 y.o. female with PMHx of A-Fib on Aspirin, DM with Charcot's joint arthropathy, HTN, fibromyalgia, and uterine cancer, who presents to the Emergency Department complaining of gradually worsening, shortness of breath that began last night. Pt reports associated chest tightness that began last night, congestion that began yesterday, and lightheadedness that began this morning while sitting up. She states her current symptoms feels similar to when she was first diagnosed with A-Fib. She notes she takes Diltiazem 120 mg daily and has not missed any doses. Pt denies any fever, new or abnormal leg swelling, pain in her bilateral calves, nausea, vomiting, and hx of PE or COPD.   Past Medical History:  Diagnosis Date  . Anxiety   . Atrial fibrillation (HCC)   . Bilateral carotid artery stenosis 05/06/15   showed 50-69% stenosis bilaterally  . Cancer (HCC)    uterine  . Cerebrovascular small vessel disease 04/26/15   per MRA of brain without contrast  . Degenerative joint disease   . Depression   . Diabetes mellitus    niddm  . 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 Dysrhythmia    hx of A Fib  . Fibromyalgia   . History of stress test 2007   NM stress test negative  . Hyperlipidemia   . Hypertension   . Intracranial vascular stenosis 04/26/15   significant vascular stenosis per MRA of brain without contrast  . Neuropathy     Patient Active Problem List   Diagnosis  Date Noted  . Genetic testing 05/14/2016  . History of endometrial cancer 03/31/2016  . Excessive sleepiness 04/29/2015  . Memory loss 04/29/2015  . Diabetic neuropathy, painful (HCC) 10/21/2013  . Morbid obesity (HCC) 04/19/2013  . Fibromyalgia 04/19/2013  . Ventral hernia 02/24/2013  . Carotid stenosis 02/24/2013  . Chest pain 02/03/2013  . Pneumonia 02/03/2013  . Charcot foot due to diabetes mellitus (HCC) 12/10/2012  . Type 2 diabetes mellitus not at goal [facility]) 12/10/2012  . Essential hypertension, benign 12/10/2012  . Hyperlipemia 12/10/2012    Past Surgical History:  Procedure Laterality Date  . ABDOMINAL HYSTERECTOMY  2000  . AMPUTATION  04/11/2011   Procedure: AMPUTATION DIGIT;  Surgeon: Dallas Schimke;  Location: [facility] OR[facility];  Service: Orthopedics;  Laterality: Left;  Amputation Second Toe Left Foot  . AMPUTATION Right 03/10/2016   Procedure: PARTIAL AMPUTATION 5TH RAY RIGHT FOOT;  Surgeon: Ferman Hamming, DPM;  Location: [facility];  Service: Podiatry;  Laterality: Right;  . BONE BIOPSY  04/11/2011   Procedure: BONE BIOPSY;  Surgeon: Dallas Schimke;  Location: [facility];  Service: Orthopedics;  Laterality: Left;  Bone Biopsy Left Foot Third Toe   . CARPAL TUNNEL RELEASE  2003   bilateral   . CHOLECYSTECTOMY  2007  . COLONOSCOPY    . HERNIA REPAIR  2007   incisional hernia repair x2  . KNEE ARTHROSCOPY     left knee-multiple  . KNEE ARTHROSCOPY  2000  right knee  . REPLACEMENT TOTAL KNEE BILATERAL     2006  . THYROID CYST EXCISION  1996   removal of nodule  . TOE AMPUTATION  November 2012  . TUBAL LIGATION  1978    OB History    Gravida Para Term Preterm AB Living   SAB TAB Ectopic Multiple Live Births                 Home Medications    Prior to Admission medications   Medication Sig Start Date End Date Taking? Authorizing Provider  aspirin 81 MG chewable tablet Chew 1 tablet (81 mg total) by mouth daily. 02/03/13  Yes Standley Brooking, MD  Cyanocobalamin (VITAMIN B 12 PO) Take 5,000 Units by mouth daily.   Yes Historical Provider, MD  diltiazem (CARDIZEM) 120 MG tablet Take 1 tablet (120 mg total) by mouth daily. 05/31/16  Yes Babs Sciara, MD  empagliflozin (JARDIANCE) 25 MG TABS tablet Take 25 mg by mouth daily. 02/10/16  Yes Babs Sciara, MD  ibuprofen (ADVIL,MOTRIN) 800 MG tablet Take 1 tablet (800 mg total) by mouth daily. Patient taking differently: Take 800 mg by mouth 2 (two) times daily as needed for moderate pain.  02/03/13  Yes Standley Brooking, MD  Insulin Glargine (LANTUS SOLOSTAR) 100 UNIT/ML Solostar Pen 30 UNITS TWO TIMES A DAY. MAY TITRATE UP TO 80 UNITS NIGHTLY 05/31/16  Yes Babs Sciara, MD  insulin lispro (HUMALOG KWIKPEN) 100 UNIT/ML KiwkPen INJECT 20-25 UNITS WITH MEAL 05/31/16  Yes Babs Sciara, MD  lisinopril (PRINIVIL,ZESTRIL) 5 MG tablet Take 0.5 tablets (2.5 mg total) by mouth daily. 05/31/16  Yes Babs Sciara, MD  modafinil (PROVIGIL) 200 MG tablet Take 1 tablet (200 mg total) by mouth daily. 05/31/16  Yes Babs Sciara, MD  Multiple Vitamin (MULTIVITAMIN) tablet Take 1 tablet by mouth daily.   Yes Historical Provider, MD  PARoxetine (PAXIL) 20 MG tablet TAKE 4 TABLETS BY MOUTH EVERY MORNING 05/03/16  Yes Babs Sciara, MD  pregabalin (LYRICA) 100 MG capsule Take 1 capsule (100 mg total) by mouth 2 (two) times daily. 05/31/16  Yes Babs Sciara, MD  traMADol (ULTRAM) 50 MG tablet TAKE 2 TABLETS BY MOUTH EVERY 8 HOURS AS NEEDED FOR PAIN 05/03/16  Yes Babs Sciara, MD  furosemide (LASIX) 20 MG tablet Take 1 tablet (20 mg total) by mouth daily. 09/20/16   Marily Memos, MD    Family History Family History  Problem Relation Age of Onset  . Brain cancer Father     glioblastoma dx. early 17s; d. 73y  . Atrial fibrillation Father   . Diabetes Father   . Bipolar disorder Father   . Heart disease Father   . Osteoarthritis Mother   . Fibromyalgia Mother   . Thyroid disease Mother   . Other  Mother     +SDHC mutation  . Cancer Mother     adrenal gland cancer  . Skin cancer Mother     unspecified type  . Fibroids Mother     hx of hysterectomy  . Heart attack Brother     about age 3  . Stroke Brother   . Atrial fibrillation Brother   . Diabetes Brother   . Other Brother     tumor removed from his carotid artery, dx 52-54  . Stroke Sister   . Fibromyalgia Sister   . Thyroid disease Maternal  Aunt   . Alzheimer's disease Maternal Aunt     d. 59  . Diabetes Paternal Aunt   . Thyroid disease Paternal Uncle   . Renal cancer Paternal Grandfather     dx. 85-86  . Diabetes Paternal Grandfather   . Heart Problems Paternal Grandfather     d. 8  . Arthritis Brother   . Bipolar disorder Daughter   . Stroke Maternal Grandmother     d. 1  . High blood pressure Maternal Grandfather   . Heart Problems Maternal Grandfather   . Liver cancer Paternal Grandmother     dx. 67; mets to lung, d. 69  . Thyroid disease Maternal Aunt   . Lupus Cousin     maternal 1st cousin  . Breast cancer Other     (x2) paternal great aunts (PGF's sisters)  . Breast cancer Other     paternal great grandmother (PGF's mother)  . Brain cancer Other 50    paternal great grandfather (PGF's father)  . Anesthesia problems Neg Hx   . Hypotension Neg Hx   . Malignant hyperthermia Neg Hx   . Pseudochol deficiency Neg Hx     Social History Social History  Substance Use Topics  . Smoking status: Former Smoker    Packs/day: 0.50    Years: 40.00    Types: Cigarettes    Quit date: 02/27/2016  . Smokeless tobacco: Never Used     Comment: As of 03/30/16, quit in October 2017  . Alcohol use No     Allergies   Chantix [varenicline tartrate]; Codeine; and Statins   Review of Systems Review of Systems  Constitutional: Negative for fever.  HENT: Positive for congestion.   Respiratory: Positive for chest tightness and shortness of breath.   Cardiovascular: Negative for leg swelling.    Gastrointestinal: Negative for nausea and vomiting.  All other systems reviewed and are negative.  Physical Exam Updated Vital Signs BP (!) 142/67 (BP Location: Left Arm)   Pulse (!) 52   Temp 98.2 F (36.8 C) (Oral)   Resp 20   Ht  (1.778 m)   Wt (!) 311 lb (141.1 kg)   SpO2 96%   BMI 44.62 kg/m   Physical Exam  Constitutional: She is oriented to person, place, and time. She appears well-developed and well-nourished. No distress.  HENT:  Head: Normocephalic and atraumatic.  Eyes: EOM are normal.  Neck: Normal range of motion.  Cardiovascular: Normal rate, regular rhythm and normal heart sounds.   Pulmonary/Chest: No respiratory distress.  Faint crackles on the right but otherwise clear lungs. No tachypnea or respiratory distress.   Abdominal: Soft. She exhibits no distension. There is no tenderness. A hernia is present.  LLQ and periumbilical hernia.   Musculoskeletal: Normal range of motion. She exhibits edema.  Mild lower extremity edema that is equal on both sides   Neurological: She is alert and oriented to person, place, and time.  Skin: Skin is warm and dry.  Psychiatric: She has a normal mood and affect. Judgment normal.  Nursing note and vitals reviewed.  ED Treatments / Results  DIAGNOSTIC STUDIES:  Oxygen Saturation is 94% on RA, normal by my interpretation.    COORDINATION OF CARE:  12:35 PM Discussed treatment plan with pt at bedside and pt agreed to plan.  Labs (all labs ordered are listed, but only abnormal results are displayed) Labs Reviewed  CBC WITH DIFFERENTIAL/PLATELET - Abnormal; Notable for the following:  Result Value   WBC 11.3 (*)    All other components within normal limits  BASIC METABOLIC PANEL - Abnormal; Notable for the following:    Glucose, Bld 155 (*)    BUN 24 (*)    All other components within normal limits  BRAIN NATRIURETIC PEPTIDE - Abnormal; Notable for the following:    B Natriuretic Peptide 199.0 (*)    All  other components within normal limits  TROPONIN I  TROPONIN I    EKG  EKG Interpretation  Date/Time:  Wednesday September 20 2016 12:13:11 EDT Ventricular Rate:  51 PR Interval:    QRS Duration: 85 QT Interval:  435 QTC Calculation: 401 R Axis:   71 Text Interpretation:  Junctional rhythm Anteroseptal infarct, age indeterminate Baseline wander in lead(s) V1 V3 new TWI in aVL, otherwise similar to 02/2016 Confirmed by [facility] MD, Barbara Cower [phone]) on 09/20/2016 12:28:40 PM       Radiology Dg Chest Portable 1 View  Result Date: 09/20/2016 CLINICAL DATA:  Chest pain and shortness of breast since last night, central chest pain, hypertension, diabetes mellitus, fibromyalgia, atrial fibrillation, history uterine cancer EXAM: PORTABLE CHEST 1 VIEW COMPARISON:  Portable exam 1223 hours compared to 05/05/2013 FINDINGS: Borderline enlargement of cardiac silhouette with pulmonary vascular congestion. Mediastinal contours normal. No gross infiltrate, pleural effusion or pneumothorax. Bones unremarkable. IMPRESSION: Borderline enlargement of cardiac silhouette with pulmonary vascular congestion. No acute infiltrate. Electronically Signed   By: Ulyses Southward M.D.   On: 09/20/2016 12:40    Procedures Procedures (including critical care time)  Medications Ordered in ED Medications  ipratropium-albuterol (DUONEB) 0.5-2.5 (3) MG/3ML nebulizer solution 3 mL (3 mLs Nebulization Given 09/20/16 1504)  furosemide (LASIX) injection 40 mg (40 mg Intravenous Given 09/20/16 1512)  ibuprofen (ADVIL,MOTRIN) tablet 800 mg (800 mg Oral Given 09/20/16 1520)    Initial Impression / Assessment and Plan / ED Course  I have reviewed the triage vital signs and the nursing notes.  Pertinent labs & imaging results that were available during my care of the patient were reviewed by me and considered in my medical decision making (see chart for details).     Signs and symptoms seemed be more consistent with new onset heart  failure although mild at this time. Patient significant improvement with diuresis in the emergency room. I discussed the case with Dr. Purvis Sheffield, who said his office would make an appointment. A delta troponin was negative making an acute coronary event unlikely. Considered PE but no e/o DVT or other risk factors for same and had a more likely explanation so it is unlikely but if not improving in time, may consider PE study as an outpatient.   Discharge on Lasix with close cardiology follow-up.  I personally performed the services described in this documentation, which was scribed in my presence. The recorded information has been reviewed and is accurate.   Final Clinical Impressions(s) / ED Diagnoses   Final diagnoses:  Nonspecific chest pain    New Prescriptions Discharge Medication List as of 09/20/2016  4:34 PM    START taking these medications   Details  furosemide (LASIX) 20 MG tablet Take 1 tablet (20 mg total) by mouth daily., Starting Wed 09/20/2016, Print         Marily Memos, MD 09/20/16 [phone]

## 2016-09-20 NOTE — ED Triage Notes (Signed)
Chest pain and sob since last night

## 2016-09-21 MED FILL — FUROSEMIDE 20 MG TABLET: 20 | 30 days supply | Qty: 30 | Fill #0

## 2016-09-22 ENCOUNTER — Telehealth: Payer: Self-pay | Admitting: *Deleted

## 2016-09-22 MED FILL — HUMALOG 100 UNITS/ML KWIKPE: 100 | 20 days supply | Qty: 15 | Fill #2

## 2016-09-22 NOTE — Telephone Encounter (Signed)
Pharmacy called to confirm dosage of humalog. Patient is currently taking -20-25 units with meals per EPIC

## 2016-09-28 DIAGNOSIS — H5213 Myopia, bilateral: Secondary | ICD-10-CM | POA: Diagnosis not present

## 2016-10-03 ENCOUNTER — Telehealth: Payer: Self-pay | Admitting: Family Medicine

## 2016-10-03 MED ORDER — PREGABALIN 100 MG PO CAPS
100.0000 mg | ORAL_CAPSULE | Freq: Three times a day (TID) | ORAL | 1 refills | Status: DC
Start: 2016-10-03 — End: 2017-01-23

## 2016-10-03 NOTE — Telephone Encounter (Signed)
Currently on lyrica 100mg   One BID

## 2016-10-03 NOTE — Telephone Encounter (Signed)
She may increase this 100mg , take 1 pill 3 times daily may have new prescription if patient does not have enough

## 2016-10-03 NOTE — Telephone Encounter (Signed)
Pt is having increased neuropathy pain in her legs and has an appt in about ten days. Pt is wanting to know if the dosage on her lyrica can be increased. Please advise.

## 2016-10-03 NOTE — Telephone Encounter (Signed)
Prescription sent electronically to pharmacy. Patient notified. 

## 2016-10-04 MED FILL — LYRICA 100 MG CAPSULE: 100 | 90 days supply | Qty: 270 | Fill #0

## 2016-10-05 ENCOUNTER — Encounter: Payer: Self-pay | Admitting: Cardiology

## 2016-10-05 NOTE — Progress Notes (Signed)
Cardiology Office Note  Date: 10/06/2016   ID: [name], DOB [birth year], MRN [id number]  PCP: Babs Sciara, MD  Consulting Cardiologist: Nona Dell, MD   Chief Complaint  Patient presents with  . Shortness of Breath    History of Present Illness: [name] is a 61 y.o. female referred for cardiology consultation by Dr. Harold Hedge for the assessment of suspected heart failure symptoms. Records indicate that she was seen in the ER in late April with shortness of breath and chest discomfort as well as congestion. She thought that she may have been back in atrial fibrillation which she had experienced in the past, but was in sinus rhythm on assessment. There was no evidence of ACS by serial troponin I levels, she was started on Lasix and cardiology office visit was scheduled after discussion with Dr. Purvis Sheffield by phone.  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 Tells me that with the ER visit discussed above she was at work and just starting to feel more short of breath than normal. Interestingly, she had had some adjustments in her diabetic regimen, previously on Invokana and urinating more regularly, less so on Jardiance. Since being placed on daily low-dose Lasix she feels better. Has mild ankle edema when she is up on her feet. Does not report any angina symptoms or palpitations at this point. She thinks that her last episode of atrial fibrillation was many years ago. She has not been anticoagulated.  I reviewed previous cardiac records. I saw her in consultation back in 2014 for evaluation of atypical chest pain. She underwent a Lexiscan Myoview at that time that was low risk as outlined below. So had an echocardiogram done at that point with normal LVEF and mild diastolic dysfunction.  She has additional history of carotid artery disease based on Dopplers from 2016. We discussed getting a follow-up study to ensure stability. She is on aspirin but has a history  of statin intolerance. Last LDL was 144.  Past Medical History:  Diagnosis Date  . Anxiety   . Bilateral carotid artery stenosis   . Degenerative joint disease   . Depression   . 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 Essential hypertension   . Fibromyalgia   . History of atrial fibrillation   . History of uterine cancer   . Hyperlipidemia   . Intracranial vascular stenosis 04/26/2015   Noted by brain MRI  . Neuropathy     Past Surgical History:  Procedure Laterality Date  . ABDOMINAL HYSTERECTOMY  2000  . AMPUTATION  04/11/2011   Procedure: AMPUTATION DIGIT;  Surgeon: Dallas Schimke;  Location: [facility];  Service: Orthopedics;  Laterality: Left;  Amputation Second Toe Left Foot  . AMPUTATION Right 03/10/2016   Procedure: PARTIAL AMPUTATION 5TH RAY RIGHT FOOT;  Surgeon: Ferman Hamming, DPM;  Location: [facility];  Service: Podiatry;  Laterality: Right;  . BONE BIOPSY  04/11/2011   Procedure: BONE BIOPSY;  Surgeon: Dallas Schimke;  Location: [facility];  Service: Orthopedics;  Laterality: Left;  Bone Biopsy Left Foot Third Toe   . CARPAL TUNNEL RELEASE  2003   bilateral   . CHOLECYSTECTOMY  2007  . COLONOSCOPY    . HERNIA REPAIR  2007   incisional hernia repair x2  . KNEE ARTHROSCOPY     left knee-multiple  . KNEE ARTHROSCOPY  2000   right knee  . REPLACEMENT TOTAL KNEE BILATERAL     2006  .  THYROID CYST EXCISION  1996   removal of nodule  . TOE AMPUTATION  November 2012  . TUBAL LIGATION  1978    Current Outpatient Prescriptions  Medication Sig Dispense Refill  . aspirin 81 MG chewable tablet Chew 1 tablet (81 mg total) by mouth daily.    . Cyanocobalamin (VITAMIN B 12 PO) Take 5,000 Units by mouth daily.    Marland Kitchen diltiazem (CARDIZEM) 120 MG tablet Take 1 tablet (120 mg total) by mouth daily. 90 tablet 5  . empagliflozin (JARDIANCE) 25 MG TABS tablet Take 25 mg by mouth daily. 90 tablet 2  . furosemide (LASIX) 20 MG tablet Take 1 tablet  (20 mg total) by mouth daily. 30 tablet 0  . ibuprofen (ADVIL,MOTRIN) 800 MG tablet Take 1 tablet (800 mg total) by mouth daily. (Patient taking differently: Take 800 mg by mouth 2 (two) times daily as needed for moderate pain. )    . Insulin Glargine (LANTUS SOLOSTAR) 100 UNIT/ML Solostar Pen 30 UNITS TWO TIMES A DAY. MAY TITRATE UP TO 80 UNITS NIGHTLY 45 mL 5  . insulin lispro (HUMALOG KWIKPEN) 100 UNIT/ML KiwkPen INJECT 20-25 UNITS WITH MEAL 15 mL 3  . lisinopril (PRINIVIL,ZESTRIL) 5 MG tablet Take 0.5 tablets (2.5 mg total) by mouth daily. 90 tablet 3  . modafinil (PROVIGIL) 200 MG tablet Take 1 tablet (200 mg total) by mouth daily. 90 tablet 1  . Multiple Vitamin (MULTIVITAMIN) tablet Take 1 tablet by mouth daily.    Marland Kitchen PARoxetine (PAXIL) 20 MG tablet TAKE 4 TABLETS BY MOUTH EVERY MORNING 360 tablet 3  . pregabalin (LYRICA) 100 MG capsule Take 1 capsule (100 mg total) by mouth 3 (three) times daily. 270 capsule 1  . traMADol (ULTRAM) 50 MG tablet TAKE 2 TABLETS BY MOUTH EVERY 8 HOURS AS NEEDED FOR PAIN 180 tablet 3   No current facility-administered medications for this visit.    Allergies:  Chantix [varenicline tartrate]; Codeine; and Statins   Social History: The patient  reports that she has been smoking Cigarettes.  She has a 20.00 pack-year smoking history. She has never used smokeless tobacco. She reports that she does not drink alcohol or use drugs.   Family History: The patient's family history includes Alzheimer's disease in her maternal aunt; Arthritis in her brother; Atrial fibrillation in her brother and father; Bipolar disorder in her daughter and father; Brain cancer in her father; Brain cancer (age of onset: 61) in her other; Breast cancer in her other and other; Cancer in her mother; Diabetes in her brother, father, paternal aunt, and paternal grandfather; Fibroids in her mother; Fibromyalgia in her mother and sister; Heart Problems in her maternal grandfather and paternal  grandfather; Heart attack in her brother; Heart disease in her father; High blood pressure in her maternal grandfather; Liver cancer in her paternal grandmother; Lupus in her cousin; Osteoarthritis in her mother; Other in her brother and mother; Renal cancer in her paternal grandfather; Skin cancer in her mother; Stroke in her brother, maternal grandmother, and sister; Thyroid disease in her maternal aunt, maternal aunt, mother, and paternal uncle.   ROS:  Please see the history of present illness. Otherwise, complete review of systems is positive for abdominal hernia.  All other systems are reviewed and negative.   Physical Exam: VS:  BP 122/70 (BP Location: Left Arm, Cuff Size: Large)   Pulse 80   Ht 5\' 10"  (1.778 m)   Wt (!) 310 lb 6.4 oz (140.8 kg)   SpO2 97%  BMI 44.54 kg/m , BMI Body mass index is 44.54 kg/m.  Wt Readings from Last 3 Encounters:  10/06/16 (!) 310 lb 6.4 oz (140.8 kg)  09/20/16 (!) 311 lb (141.1 kg)  09/04/16 (!) 311 lb (141.1 kg)    General: Morbidly obese woman, appears comfortable at rest. HEENT: Conjunctiva and lids normal, oropharynx clear. Neck: Supple, no elevated JVP, soft left carotid bruit, no thyromegaly. Lungs: Diminished breath sounds but clear without wheezing, nonlabored breathing at rest. Cardiac: Regular rate and rhythm, no S3 or significant systolic murmur, no pericardial rub. Abdomen: Soft, pannus, bowel sounds present. Extremities: Mild lower leg edema, distal pulses 2+. Skin: Warm and dry. Musculoskeletal: No kyphosis. Neuropsychiatric: Alert and oriented x3, affect grossly appropriate.  ECG: I personally reviewed the tracing from 09/20/2016 which showed sinus bradycardia with low voltage.  Recent Labwork: 06/02/2016: ALT 21; AST 20 09/20/2016: B Natriuretic Peptide 199.0; BUN 24; Creatinine, Ser 0.70; Hemoglobin 14.9; Platelets 204; Potassium 4.1; Sodium 138     Component Value Date/Time   CHOL 239 (H) 06/02/2016 1341   TRIG 202 (H)  06/02/2016 1341   HDL 55 06/02/2016 1341   CHOLHDL 4.3 06/02/2016 1341   CHOLHDL 4.7 01/07/2014 1115   VLDL 42 (H) 01/07/2014 1115   LDLCALC 144 (H) 06/02/2016 1341    Other Studies Reviewed Today:  Echocardiogram 02/03/2013: Study Conclusions  - Left ventricle: The cavity size was normal. There was mild concentric hypertrophy. Systolic function was normal. The estimated ejection fraction was in the range of 60% to 65%. Wall motion was normal; there were no regional wall motion abnormalities. Doppler parameters are consistent with abnormal left ventricular relaxation (grade 1 diastolic dysfunction). - Aortic valve: Mildly calcified annulus. Trileaflet. No significant regurgitation. - Left atrium: The atrium was at the upper limits of normal in size. - Tricuspid valve: Physiologic regurgitation. - Pulmonary arteries: Systolic pressure could not be accurately estimated. - Inferior vena cava: Poorly visualized. - Pericardium, extracardiac: There was no pericardial effusion. Impressions:  - No prior study for comparison. Mild LVH with LVEF 60-65%, grade 1 diastolic dysfunction. Upper normal left atrial size. Unable to assess PASP.  Chest x-ray 09/20/2016: FINDINGS: Borderline enlargement of cardiac silhouette with pulmonary vascular congestion.  Mediastinal contours normal.  No gross infiltrate, pleural effusion or pneumothorax.  Bones unremarkable.  IMPRESSION: Borderline enlargement of cardiac silhouette with pulmonary vascular congestion.  No acute infiltrate.  Lexiscan Myoview 9/70/2014: IMPRESSION: Low risk Lexiscan Myoview.  There were no diagnostic ST-segment abnormalities, no arrhythmias noted.  Perfusion imaging shows evidence of breast attenuation, however no scar or ischemia.  LVEF is normal at 59% with normal LV volumes.  Assessment and Plan:  1. Shortness of breath and intermittent leg edema. She states that she has  improved since being on Lasix. LVEF was 60-65% with mild diastolic dysfunction is a 2014. We discussed obtaining a follow-up echocardiogram and for now staying on low-dose Lasix. She is not reporting any angina symptoms to suggest ischemic workup at this point.  2. Remote history of atrial fibrillation, no obvious recurrences in the last several years. She has not been anticoagulated long-term and remains on aspirin. If she has a repeat event, CHADSVASC score of 4 would suggest anticoagulation discussion.  3. Bilateral carotid artery disease, 50-69% ICA stenoses as of 2016. Follow-up carotid Dopplers will be obtained.  4. History of statin intolerance. Recent LDL 144. Could consider Zetia or Niaspan. She has follow-up with Dr. Gerda DissLuking soon.  Current medicines were reviewed with the patient today.  Orders Placed This Encounter  Procedures  . US Carotid Duplex Bilateral  . ECHOCARDIOGRAM COMPLETE    Disposition: Call with test results.  Signed, Jonelle Sidle, MD, [name] 10/06/2016 3:26 PM    [facility] [address], [place], [place] [address] Phone: [phone]; Fax: [phone]

## 2016-10-06 ENCOUNTER — Encounter: Payer: Self-pay | Admitting: Cardiology

## 2016-10-06 ENCOUNTER — Ambulatory Visit (INDEPENDENT_AMBULATORY_CARE_PROVIDER_SITE_OTHER): Payer: 59 | Admitting: Cardiology

## 2016-10-06 VITALS — BP 122/70 | HR 80 | Ht 70.0 in | Wt 310.4 lb

## 2016-10-06 DIAGNOSIS — I779 Disorder of arteries and arterioles, unspecified: Secondary | ICD-10-CM | POA: Diagnosis not present

## 2016-10-06 DIAGNOSIS — R0602 Shortness of breath: Secondary | ICD-10-CM | POA: Diagnosis not present

## 2016-10-06 DIAGNOSIS — E782 Mixed hyperlipidemia: Secondary | ICD-10-CM | POA: Diagnosis not present

## 2016-10-06 DIAGNOSIS — Z8679 Personal history of other diseases of the circulatory system: Secondary | ICD-10-CM | POA: Diagnosis not present

## 2016-10-06 DIAGNOSIS — Z789 Other specified health status: Secondary | ICD-10-CM | POA: Diagnosis not present

## 2016-10-06 DIAGNOSIS — I739 Peripheral vascular disease, unspecified: Secondary | ICD-10-CM

## 2016-10-06 NOTE — Patient Instructions (Signed)
Medication Instructions:  Your physician recommends that you continue on your current medications as directed. Please refer to the Current Medication list given to you today.  Labwork: NONE  Testing/Procedures: Your physician has requested that you have an echocardiogram. Echocardiography is a painless test that uses sound waves to create images of your heart. It provides your doctor with information about the size and shape of your heart and how well your heart's chambers and valves are working. This procedure takes approximately one hour. There are no restrictions for this procedure.  Your physician has requested that you have a carotid duplex. This test is an ultrasound of the carotid arteries in your neck. It looks at blood flow through these arteries that supply the brain with blood. Allow one hour for this exam. There are no restrictions or special instructions.  Follow-Up: Your physician recommends that you schedule a follow-up appointment PENDING TEST RESULTS   Any Other Special Instructions Will Be Listed Below (If Applicable).  If you need a refill on your cardiac medications before your next appointment, please call your pharmacy.

## 2016-10-10 ENCOUNTER — Encounter: Payer: Self-pay | Admitting: Family Medicine

## 2016-10-10 ENCOUNTER — Ambulatory Visit (INDEPENDENT_AMBULATORY_CARE_PROVIDER_SITE_OTHER): Payer: 59 | Admitting: Family Medicine

## 2016-10-10 VITALS — BP 146/94 | Ht 70.0 in | Wt 311.6 lb

## 2016-10-10 DIAGNOSIS — E1140 Type 2 diabetes mellitus with diabetic neuropathy, unspecified: Secondary | ICD-10-CM | POA: Diagnosis not present

## 2016-10-10 DIAGNOSIS — M25562 Pain in left knee: Secondary | ICD-10-CM

## 2016-10-10 DIAGNOSIS — E782 Mixed hyperlipidemia: Secondary | ICD-10-CM

## 2016-10-10 DIAGNOSIS — M25552 Pain in left hip: Secondary | ICD-10-CM

## 2016-10-10 DIAGNOSIS — I10 Essential (primary) hypertension: Secondary | ICD-10-CM

## 2016-10-10 MED ORDER — INSULIN LISPRO 100 UNIT/ML (KWIKPEN): PEN_INJECTOR | SUBCUTANEOUS | 3 refills | Status: DC

## 2016-10-10 MED ORDER — EZETIMIBE 10 MG PO TABS: 10.0000 mg | ORAL_TABLET | Freq: Every day | ORAL | 12 refills | Status: AC

## 2016-10-10 MED ORDER — FUROSEMIDE 20 MG PO TABS: 20.0000 mg | ORAL_TABLET | Freq: Every day | ORAL | 1 refills | Status: AC

## 2016-10-10 MED ORDER — EMPAGLIFLOZIN 25 MG PO TABS
25.0000 mg | ORAL_TABLET | Freq: Every day | ORAL | 2 refills | Status: AC
Start: 2016-10-10 — End: ?

## 2016-10-10 MED ORDER — FUROSEMIDE 20 MG PO TABS: 20.0000 mg | ORAL_TABLET | Freq: Every day | ORAL | 1 refills | Status: DC

## 2016-10-10 MED ORDER — MODAFINIL 200 MG PO TABS
200.0000 mg | ORAL_TABLET | Freq: Every day | ORAL | 1 refills | Status: AC
Start: 2016-10-10 — End: ?

## 2016-10-10 MED ORDER — TRAMADOL HCL 50 MG PO TABS: 100.0000 mg | ORAL_TABLET | Freq: Three times a day (TID) | ORAL | 3 refills | Status: DC | PRN

## 2016-10-10 MED FILL — JARDIANCE 25 MG TABLET: 25 | 30 days supply | Qty: 30 | Fill #0

## 2016-10-10 MED FILL — EZETIMIBE 10 MG TAB: 10 | 30 days supply | Qty: 30 | Fill #0

## 2016-10-10 MED FILL — HUMALOG 100 UNITS/ML KWIKPE: 100 | 18 days supply | Qty: 15 | Fill #0

## 2016-10-10 NOTE — Progress Notes (Signed)
   Subjective:    Patient ID: [name], female    DOB: 01-Jan-1956, 61 y.o.   MRN: [id number]  HPI Patient arrives for a follow up from a recent ER visit for new onset CHF. Patient is follow ing up with cardiology and has echo and dopplers scheduled thru them. Patient does take her 81 mg aspirin to prevent heart attacks and strokes no problems with it Takes her diltiazem regularly for blood pressure denies any problems with atrial fib Is taking her diabetes medicine recent A1c looked good Cholesterol does not tolerate statins may need to be on a different medicine Does need refills on medications Also having knee and hip pain on the left side present over the patient patient's moods past couple months would like to have some x-rays Patient's moods overall are doing well on current medication Taking Lyrica 3 times a day to help her neuropathy this is doing well Takes her tramadol as needed for pain Dr Kennyth LoseAlusio-This is who she sees for orthopedics  Review of Systems  Constitutional: Negative for activity change, appetite change and fatigue.  HENT: Negative for congestion.   Respiratory: Negative for cough.   Cardiovascular: Negative for chest pain.  Gastrointestinal: Negative for abdominal pain.  Endocrine: Negative for polydipsia and polyphagia.  Neurological: Negative for weakness.  Psychiatric/Behavioral: Negative for confusion.       Objective:   Physical Exam  Constitutional: She appears well-nourished. No distress.  Cardiovascular: Normal rate, regular rhythm and normal heart sounds.   No murmur heard. Pulmonary/Chest: Effort normal and breath sounds normal. No respiratory distress.  Musculoskeletal: She exhibits no edema.  Lymphadenopathy:    She has no cervical adenopathy.  Neurological: She is alert. She exhibits normal muscle tone.  Psychiatric: Her behavior is normal.  Vitals reviewed.  Patient had diabetic eye checkup recently She sees podiatry for foot  exams and regular follow-up Recent A1c 6.7     Assessment & Plan:  HTN decent control she watches her diet and takes her medicine Diabetic neuropathy Lyrica 3 times a day does help Arthralgias she does use tramadol 2 tablets up to 3 times a day denies abusing it states it does help her with her pain Morbid obesity she is considering gastric bypass but this won't be for another one or 2 years she is try to watch her diet watch portions and stay active Left knee pain she's had previous knee replacement having a lot of pain in it we will do an x-ray Left hip pain and discomfort has a lot of pain with it stiffness discomfort decreased range of motion we will do x-ray Hyperlipidemia does not tolerate statins will tries zetia 10 mg daily.  Follow-up in 6 months time

## 2016-10-11 MED FILL — traMADol HCL 50 MG TABS: 50 | 30 days supply | Qty: 180 | Fill #0

## 2016-10-12 ENCOUNTER — Ambulatory Visit (HOSPITAL_COMMUNITY)
Admission: RE | Admit: 2016-10-12 | Discharge: 2016-10-12 | Disposition: A | Discharge status: Home / Self Care | Payer: 59 | Source: Ambulatory Visit | Attending: Family Medicine | Admitting: Family Medicine

## 2016-10-12 ENCOUNTER — Ambulatory Visit (HOSPITAL_COMMUNITY)
Admission: RE | Admit: 2016-10-12 | Discharge: 2016-10-12 | Disposition: A | Discharge status: Home / Self Care | Payer: 59 | Source: Ambulatory Visit | Attending: Cardiology | Admitting: Cardiology

## 2016-10-12 DIAGNOSIS — M25552 Pain in left hip: Secondary | ICD-10-CM | POA: Diagnosis not present

## 2016-10-12 DIAGNOSIS — I10 Essential (primary) hypertension: Secondary | ICD-10-CM | POA: Insufficient documentation

## 2016-10-12 DIAGNOSIS — R0602 Shortness of breath: Secondary | ICD-10-CM | POA: Insufficient documentation

## 2016-10-12 DIAGNOSIS — Z96652 Presence of left artificial knee joint: Secondary | ICD-10-CM | POA: Diagnosis not present

## 2016-10-12 DIAGNOSIS — M25562 Pain in left knee: Secondary | ICD-10-CM | POA: Insufficient documentation

## 2016-10-12 DIAGNOSIS — E119 Type 2 diabetes mellitus without complications: Secondary | ICD-10-CM | POA: Insufficient documentation

## 2016-10-12 DIAGNOSIS — E785 Hyperlipidemia, unspecified: Secondary | ICD-10-CM | POA: Insufficient documentation

## 2016-10-12 DIAGNOSIS — I6523 Occlusion and stenosis of bilateral carotid arteries: Secondary | ICD-10-CM

## 2016-10-12 DIAGNOSIS — I739 Peripheral vascular disease, unspecified: Principal | ICD-10-CM

## 2016-10-12 DIAGNOSIS — I779 Disorder of arteries and arterioles, unspecified: Secondary | ICD-10-CM

## 2016-10-12 DIAGNOSIS — M4802 Spinal stenosis, cervical region: Secondary | ICD-10-CM | POA: Diagnosis not present

## 2016-10-12 NOTE — Progress Notes (Signed)
*  PRELIMINARY RESULTS* Echocardiogram 2D Echocardiogram has been performed.  Jeryl Columbialliott, Tensley Wery 10/12/2016, 12:17 PM

## 2016-10-17 DIAGNOSIS — B351 Tinea unguium: Secondary | ICD-10-CM | POA: Diagnosis not present

## 2016-10-17 DIAGNOSIS — E1142 Type 2 diabetes mellitus with diabetic polyneuropathy: Secondary | ICD-10-CM | POA: Diagnosis not present

## 2016-10-26 MED FILL — LANTUS SOLOSTAR 100 UNITS/M: 100 | 28 days supply | Qty: 30 | Fill #2

## 2016-11-10 ENCOUNTER — Encounter (HOSPITAL_COMMUNITY): Payer: Self-pay

## 2016-11-15 MED FILL — PARoxetine HCL 20 MG TABS: 20 | 90 days supply | Qty: 360 | Fill #2

## 2016-11-15 MED FILL — FUROSEMIDE 20 MG TABLET: 20 | 90 days supply | Qty: 90 | Fill #0

## 2016-11-27 MED FILL — traMADol HCL 50 MG TABS: 50 | 30 days supply | Qty: 180 | Fill #1

## 2016-11-27 MED FILL — LANTUS SOLOSTAR 100 UNITS/M: 100 | 28 days supply | Qty: 30 | Fill #3

## 2016-11-27 MED FILL — HUMALOG 100 UNITS/ML KWIKPE: 100 | 18 days supply | Qty: 15 | Fill #1

## 2016-11-27 MED FILL — JARDIANCE 25 MG TABLET: 25 | 30 days supply | Qty: 30 | Fill #1

## 2016-11-27 MED FILL — EZETIMIBE 10 MG TAB: 10 | 30 days supply | Qty: 30 | Fill #1

## 2016-12-01 ENCOUNTER — Telehealth: Payer: Self-pay | Admitting: Family Medicine

## 2016-12-01 NOTE — Telephone Encounter (Signed)
Form placed in Dr. Marina GoodellScotts folder/basket.

## 2016-12-01 NOTE — Telephone Encounter (Signed)
Pt dropped off a handicap placard form to be filled out. Form is in nurse box.  

## 2016-12-02 IMAGING — DX DG ANKLE COMPLETE 3+V*L*
3 series · 3 of 3 positions shown · non-contrast
Comparison: Left foot series 04/11/2011.

CLINICAL DATA: 58-year-old female with lateral ankle pain swelling
and bruising since 08/06/2014 with no known injury. Initial
encounter.

EXAM:
LEFT ANKLE COMPLETE - 3+ VIEW

[ankle ap]
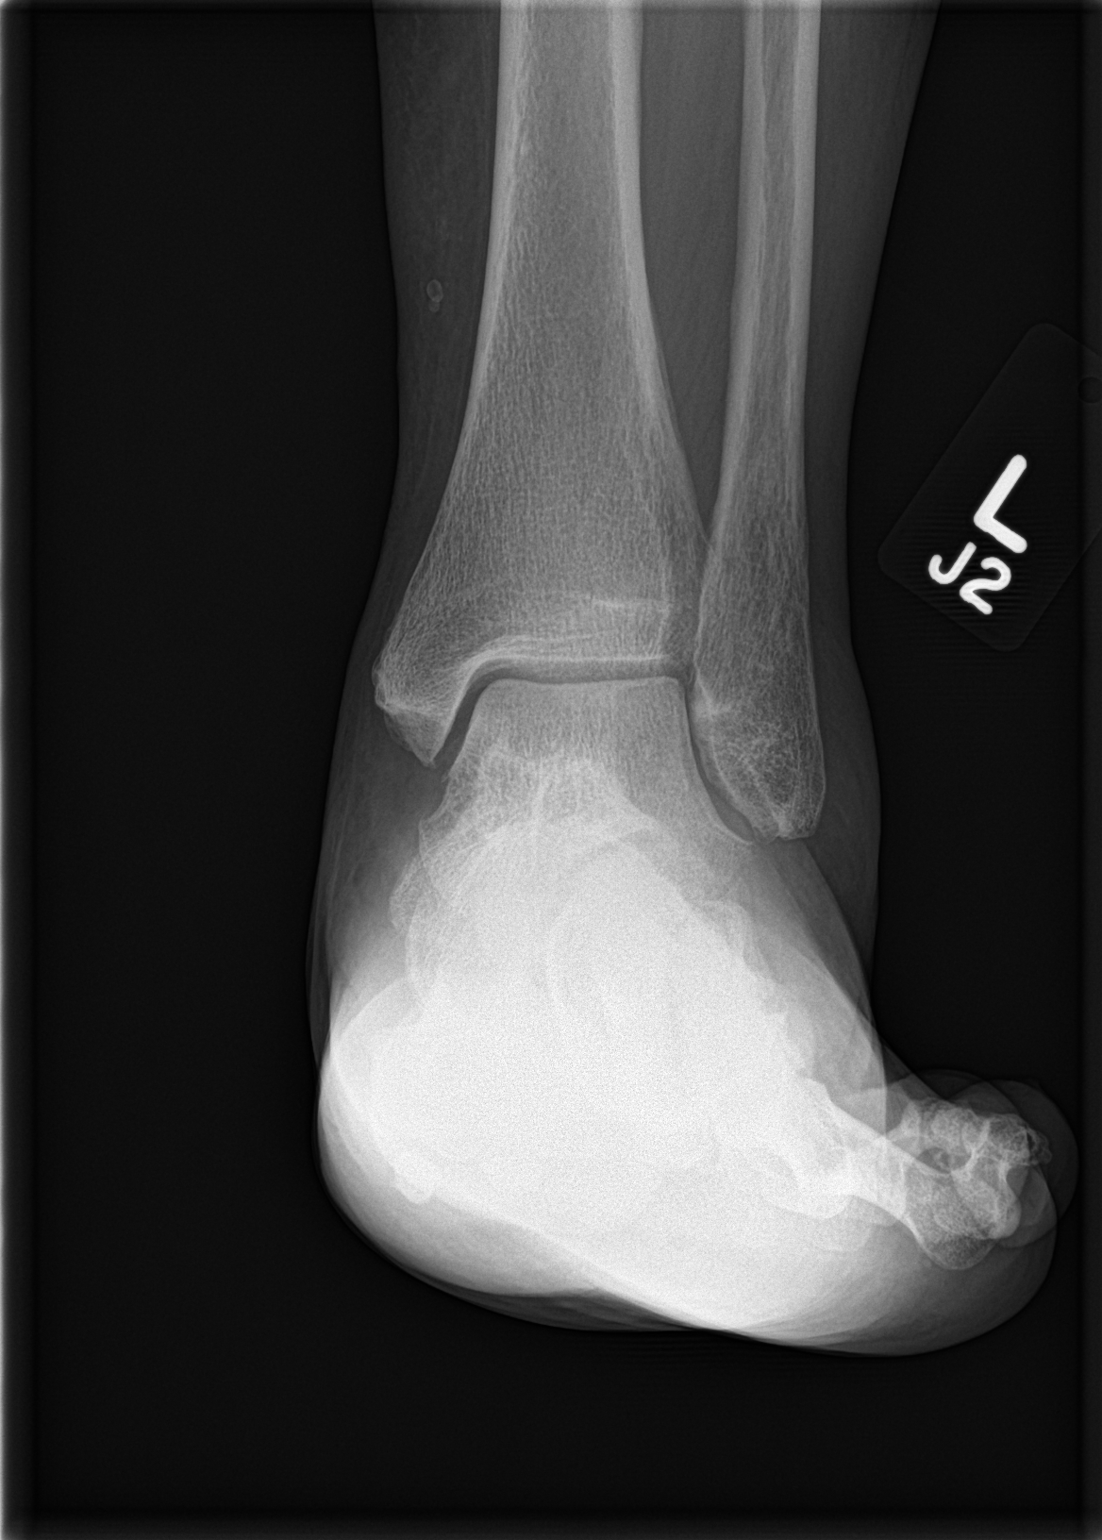

[ankle obl]
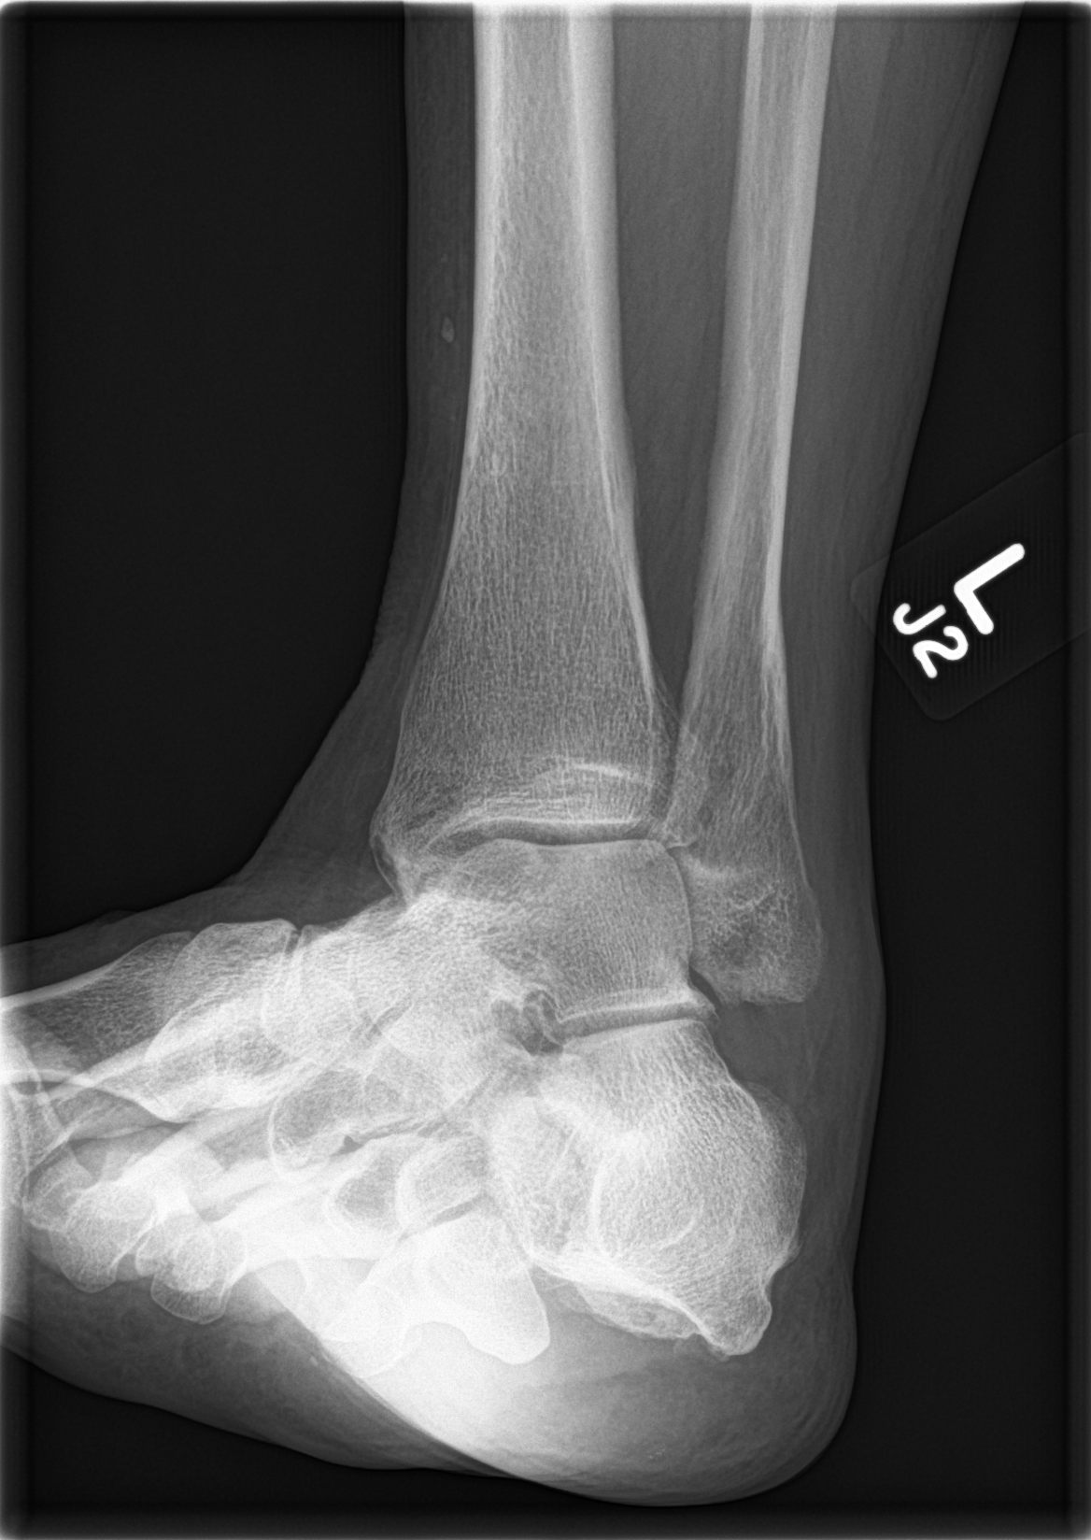

[ankle lat]
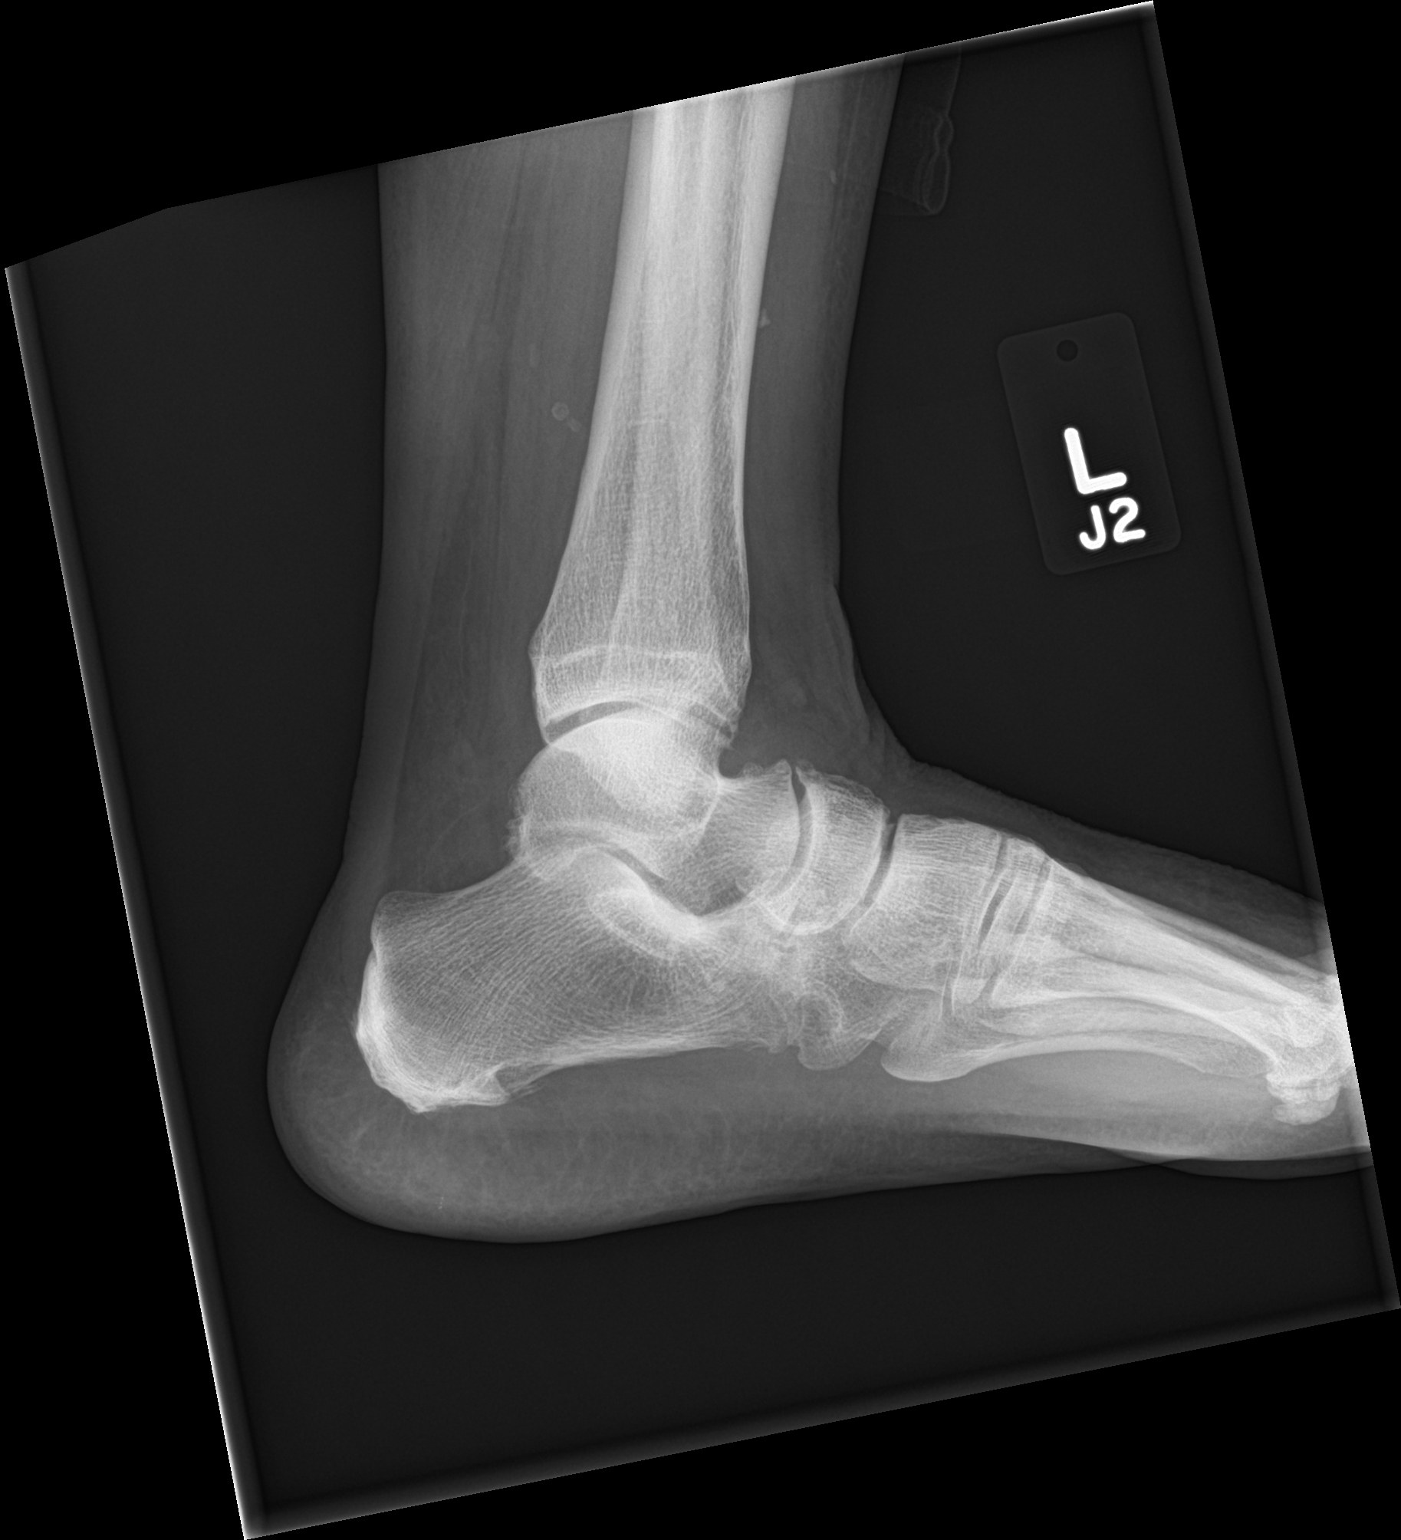

[3 of 3 positions shown; findings below may reference images not displayed]

FINDINGS: Vascular phleboliths along the distal left tibia. Mortise joint
alignment is preserved an the talar dome appears intact. However,
there is degenerative spurring at the neck of the talus, a at the
talonavicular articulation, the talocalcaneal articulation, and to a
lesser extent about the medial and lateral malleoli. Difficult to
exclude a small joint effusion. No acute fracture or dislocation
identified. Calcaneus appears intact with degenerative spurring.
IMPRESSION: Degenerative changes about the left ankle with no acute fracture or
dislocation identified.

## 2016-12-03 NOTE — Telephone Encounter (Signed)
This was completed

## 2016-12-05 ENCOUNTER — Other Ambulatory Visit: Payer: Self-pay | Admitting: Family Medicine

## 2016-12-05 DIAGNOSIS — Z1231 Encounter for screening mammogram for malignant neoplasm of breast: Principal | ICD-10-CM

## 2016-12-11 ENCOUNTER — Ambulatory Visit (HOSPITAL_COMMUNITY)
Admission: RE | Admit: 2016-12-11 | Discharge: 2016-12-11 | Disposition: A | Discharge status: Home / Self Care | Payer: 59 | Source: Ambulatory Visit | Attending: Family Medicine | Admitting: Family Medicine

## 2016-12-11 DIAGNOSIS — Z1231 Encounter for screening mammogram for malignant neoplasm of breast: Secondary | ICD-10-CM

## 2016-12-11 MED FILL — LISINOPRIL 5 MG TABLET: 5 | 90 days supply | Qty: 45 | Fill #2

## 2016-12-11 MED FILL — MODAFINIL 200 MG TAB: 200 | 90 days supply | Qty: 90 | Fill #0

## 2016-12-26 DIAGNOSIS — B351 Tinea unguium: Secondary | ICD-10-CM | POA: Diagnosis not present

## 2016-12-26 DIAGNOSIS — E1142 Type 2 diabetes mellitus with diabetic polyneuropathy: Secondary | ICD-10-CM | POA: Diagnosis not present

## 2016-12-27 ENCOUNTER — Ambulatory Visit (HOSPITAL_COMMUNITY)
Admission: RE | Admit: 2016-12-27 | Discharge: 2016-12-27 | Disposition: A | Discharge status: Home / Self Care | Payer: 59 | Source: Ambulatory Visit | Attending: Podiatry | Admitting: Podiatry

## 2016-12-27 ENCOUNTER — Other Ambulatory Visit (HOSPITAL_COMMUNITY): Payer: Self-pay | Admitting: Podiatry

## 2016-12-27 DIAGNOSIS — M7989 Other specified soft tissue disorders: Secondary | ICD-10-CM | POA: Diagnosis not present

## 2016-12-27 DIAGNOSIS — M79671 Pain in right foot: Secondary | ICD-10-CM | POA: Diagnosis not present

## 2017-01-03 MED FILL — JARDIANCE 25 MG TABLET: 25 | 30 days supply | Qty: 30 | Fill #2

## 2017-01-03 MED FILL — dilTIAZem HCL 120 MG TABS: 120 | 90 days supply | Qty: 90 | Fill #2

## 2017-01-08 MED FILL — traMADol HCL 50 MG TABS: 50 | 30 days supply | Qty: 180 | Fill #2

## 2017-01-18 MED FILL — LYRICA 100 MG CAPSULE: 100 | 90 days supply | Qty: 270 | Fill #1

## 2017-01-23 ENCOUNTER — Ambulatory Visit (INDEPENDENT_AMBULATORY_CARE_PROVIDER_SITE_OTHER): Payer: 59 | Admitting: Family Medicine

## 2017-01-23 ENCOUNTER — Encounter: Payer: Self-pay | Admitting: Family Medicine

## 2017-01-23 VITALS — BP 130/68 | Ht 70.0 in | Wt 310.4 lb

## 2017-01-23 DIAGNOSIS — I10 Essential (primary) hypertension: Secondary | ICD-10-CM | POA: Diagnosis not present

## 2017-01-23 DIAGNOSIS — E11610 Type 2 diabetes mellitus with diabetic neuropathic arthropathy: Secondary | ICD-10-CM | POA: Diagnosis not present

## 2017-01-23 DIAGNOSIS — E119 Type 2 diabetes mellitus without complications: Principal | ICD-10-CM

## 2017-01-23 LAB — POCT GLYCOSYLATED HEMOGLOBIN (HGB A1C): HEMOGLOBIN A1C: 5.8

## 2017-01-23 MED ORDER — PREGABALIN 200 MG PO CAPS
200.0000 mg | ORAL_CAPSULE | Freq: Two times a day (BID) | ORAL | 3 refills | Status: AC
Start: 2017-01-23 — End: ?

## 2017-01-23 MED ORDER — HYDROCODONE-ACETAMINOPHEN 7.5-325 MG PO TABS: 1.0000 | ORAL_TABLET | Freq: Four times a day (QID) | ORAL | 0 refills | Status: DC | PRN

## 2017-01-23 NOTE — Progress Notes (Signed)
   Subjective:    Patient ID: [name], female    DOB: [birth year], 61 y.o.   MRN: [id number]  Diabetes  She presents for her follow-up diabetic visit. She has type 2 diabetes mellitus. Pertinent negatives for hypoglycemia include no confusion. Pertinent negatives for diabetes include no chest pain, no fatigue, no polydipsia, no polyphagia and no weakness. She has not had a previous visit with a dietitian. She does not see a podiatrist.Eye exam is current.  25 minutes was spent with the patient. Greater than half the time was spent in discussion and answering questions and counseling regarding the issues that the patient came in for today.  Patient has concerns of increased pain in bilateral legs.   Results for orders placed or performed in visit on 01/23/17  POCT HgB A1C  Result Value Ref Range   Hemoglobin A1C 5.8   Significant time spent would talk with patient she has a lot of foot pain discomfort ankle pain he has to do with her chart, foot as well as her diabetes with neuropathy and also her morbid obesity she is trying to lose some weight she does take her diabetic medicine states her sugars been running good she denies any low sugar spells She also is requesting some pain medicine to use on occasional basis when she does long trips because of foot pain with walking she will not use this on a regular basis and denies misusing or abusing medicines. She does take her blood pressure medicine on a regular basis and does take her cholesterol medicine although recently she had stopped taking it but she will resume it.   Review of Systems  Constitutional: Negative for activity change, appetite change and fatigue.  HENT: Negative for congestion.   Respiratory: Negative for cough.   Cardiovascular: Negative for chest pain.  Gastrointestinal: Negative for abdominal pain.  Endocrine: Negative for polydipsia and polyphagia.  Skin: Negative for color change.  Neurological: Negative for  weakness.  Psychiatric/Behavioral: Negative for confusion.       Objective:   Physical Exam  Constitutional: She appears well-developed and well-nourished. No distress.  HENT:  Head: Normocephalic and atraumatic.  Eyes: Right eye exhibits no discharge. Left eye exhibits no discharge.  Neck: No tracheal deviation present.  Cardiovascular: Normal rate, regular rhythm and normal heart sounds.   No murmur heard. Pulmonary/Chest: Effort normal and breath sounds normal. No respiratory distress. She has no wheezes. She has no rales.  Musculoskeletal: She exhibits no edema.  Lymphadenopathy:    She has no cervical adenopathy.  Neurological: She is alert. She exhibits normal muscle tone.  Skin: Skin is warm and dry. No erythema.  Psychiatric: Her behavior is normal.  Vitals reviewed.  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 Watch diet closely.  Charcot foot patient benefits from diabetic shoes. Detailed diabetic foot exam was done today  Morbid obesity patient was counseled to watch calories and to try to be physically active as possible  Blood pressure good control  Hyperlipidemia previous labs reviewed we will do more labs on next visit

## 2017-01-24 MED FILL — HYDROCODON-APAP 7.5-325: 7.5-325 | 5 days supply | Qty: 20 | Fill #0

## 2017-01-30 MED FILL — LANTUS SOLOSTAR 100 UNITS/M: 100 | 28 days supply | Qty: 30 | Fill #4

## 2017-01-30 MED FILL — HUMALOG 100 UNITS/ML KWIKPE: 100 | 18 days supply | Qty: 15 | Fill #2

## 2017-02-05 ENCOUNTER — Ambulatory Visit: Payer: Self-pay | Admitting: *Deleted

## 2017-02-13 ENCOUNTER — Encounter: Payer: Self-pay | Admitting: Family Medicine

## 2017-02-13 ENCOUNTER — Ambulatory Visit (INDEPENDENT_AMBULATORY_CARE_PROVIDER_SITE_OTHER): Payer: 59 | Admitting: Family Medicine

## 2017-02-13 VITALS — BP 132/64 | Ht 70.0 in | Wt 315.0 lb

## 2017-02-13 DIAGNOSIS — M4808 Spinal stenosis, sacral and sacrococcygeal region: Secondary | ICD-10-CM

## 2017-02-13 DIAGNOSIS — E1140 Type 2 diabetes mellitus with diabetic neuropathy, unspecified: Secondary | ICD-10-CM

## 2017-02-13 DIAGNOSIS — M4800 Spinal stenosis, site unspecified: Secondary | ICD-10-CM | POA: Diagnosis not present

## 2017-02-13 MED ORDER — HYDROCODONE-ACETAMINOPHEN 7.5-325 MG PO TABS: 1.0000 | ORAL_TABLET | Freq: Four times a day (QID) | ORAL | 0 refills | Status: DC | PRN

## 2017-02-13 NOTE — Progress Notes (Addendum)
   Subjective:    Patient ID: [name], female    DOB: 10/25/1955, 61 y.o.   MRN: [id number]  HPI Patient here today for upper and lower leg pain that is getting worse. It is causing problems with her every day living. Says she is concerned that she may have ot go out on disability as it is causing so much pain.  Patient states hydrocodone did help her with the pain did not cause drowsiness she is interested in trying this to get her pain under better management currently the pain is 9 out of 10 so severe she cannot work she would like to be up to keep working.  She is having progressive weakness and pain in the legs and having shorter distances she can walk without having progressive weakness and risk of falling Review of Systems  Constitutional: Negative for activity change, appetite change and fatigue.  HENT: Negative for congestion.   Respiratory: Negative for cough.   Cardiovascular: Negative for chest pain.  Gastrointestinal: Negative for abdominal pain.  Endocrine: Negative for polydipsia and polyphagia.  Musculoskeletal: Positive for arthralgias and back pain.  Skin: Negative for color change.  Neurological: Positive for weakness and numbness.  Psychiatric/Behavioral: Negative for confusion.       Objective:   Physical Exam  Constitutional: She appears well-developed and well-nourished. No distress.  HENT:  Head: Normocephalic and atraumatic.  Eyes: Right eye exhibits no discharge. Left eye exhibits no discharge.  Neck: No tracheal deviation present.  Cardiovascular: Normal rate, regular rhythm and normal heart sounds.   No murmur heard. Pulmonary/Chest: Effort normal and breath sounds normal. No respiratory distress. She has no wheezes. She has no rales.  Musculoskeletal: She exhibits no edema.  Lymphadenopathy:    She has no cervical adenopathy.  Neurological: She is alert. She exhibits normal muscle tone.  Skin: Skin is warm and dry. No erythema.    Psychiatric: Her behavior is normal.  Vitals reviewed.  Patient has dense neuropathy of the lower legs subjective painful neuropathy of the lower legs also has weakness in the quadriceps muscle as well as hip adductors  Diabetic foot examination she also has evidence of amputation and Charcot diabetic changes.  She does have a history of previous foot ulceration and she also has mild peripheral vascular disease  25 minutes was spent with the patient. Greater than half the time was spent in discussion and answering questions and counseling regarding the issues that the patient came in for today.        Assessment & Plan:  Suspected spinal stenosis-clinically her symptoms go along with this. Patient would benefit from MRI the lumbar spine. She has already tried gabapentin and Lyrica she is also tried ibuprofen she is been followed here in progressively getting worse with the neuropathy in her legs now having weakness  Painful diabetic neuropathy tramadol no longer taking care of that issue recommend hydrocodone up to 4 times a day caution drowsiness for home use initially it does well with this may take it throughout the day if he does cause drowsiness no driving  Although the patient does have painful diabetic neuropathy I believe the weakness that is coming in her legs is more likely due to spinal stenosis and therefore needs an MRI they will need consultation with orthopedic back specialists or neurosurgical after MRI is completed  Patient will follow-up in 3-4 weeks to see how well the pain medication is doing

## 2017-02-14 MED FILL — FUROSEMIDE 20 MG TABLET: 20 | 90 days supply | Qty: 90 | Fill #1

## 2017-02-14 MED FILL — PARoxetine HCL 20 MG TABS: 20 | 90 days supply | Qty: 360 | Fill #3

## 2017-02-14 MED FILL — HUMALOG 100 UNITS/ML KWIKPE: 100 | 18 days supply | Qty: 15 | Fill #3

## 2017-02-14 MED FILL — HYDROCODON-APAP 7.5-325: 7.5-325 | 30 days supply | Qty: 120 | Fill #0

## 2017-02-14 MED FILL — JARDIANCE 25 MG TABLET: 25 | 30 days supply | Qty: 30 | Fill #3

## 2017-02-15 ENCOUNTER — Ambulatory Visit (HOSPITAL_COMMUNITY): Payer: 59

## 2017-02-16 ENCOUNTER — Encounter: Payer: Self-pay | Admitting: Family Medicine

## 2017-02-21 ENCOUNTER — Telehealth: Payer: Self-pay | Admitting: Family Medicine

## 2017-02-21 NOTE — Telephone Encounter (Signed)
FYI, Patient just wanted to let Dr. Lorin Picket know that she is thinking about going out on FMLA for her legs. Possibly next week.

## 2017-02-22 ENCOUNTER — Telehealth: Payer: Self-pay | Admitting: Family Medicine

## 2017-02-22 ENCOUNTER — Ambulatory Visit (HOSPITAL_COMMUNITY)
Admission: RE | Admit: 2017-02-22 | Discharge: 2017-02-22 | Disposition: A | Discharge status: Home / Self Care | Payer: 59 | Source: Ambulatory Visit | Attending: Family Medicine | Admitting: Family Medicine

## 2017-02-22 DIAGNOSIS — E1140 Type 2 diabetes mellitus with diabetic neuropathy, unspecified: Secondary | ICD-10-CM | POA: Diagnosis not present

## 2017-02-22 DIAGNOSIS — M4800 Spinal stenosis, site unspecified: Secondary | ICD-10-CM | POA: Diagnosis present

## 2017-02-22 DIAGNOSIS — M5136 Other intervertebral disc degeneration, lumbar region: Secondary | ICD-10-CM | POA: Insufficient documentation

## 2017-02-22 DIAGNOSIS — M48061 Spinal stenosis, lumbar region without neurogenic claudication: Secondary | ICD-10-CM | POA: Insufficient documentation

## 2017-02-22 DIAGNOSIS — M545 Low back pain: Secondary | ICD-10-CM | POA: Diagnosis not present

## 2017-02-22 DIAGNOSIS — M4808 Spinal stenosis, sacral and sacrococcygeal region: Secondary | ICD-10-CM | POA: Insufficient documentation

## 2017-02-22 DIAGNOSIS — Z029 Encounter for administrative examinations, unspecified: Secondary | ICD-10-CM

## 2017-02-22 NOTE — Telephone Encounter (Signed)
That would be fine-we would need direction from the patient if she thinks she will need to be out temporarily such as a few weeks or if she is seeking long-term disability

## 2017-02-22 NOTE — Telephone Encounter (Signed)
Patient brought in FMLA to be filled out,please review form and fill in highlighted areas.She is asking for a year coverage but she is seeing to two specialist.She will be following up with them instead of you.Form is in your folder.

## 2017-02-23 ENCOUNTER — Other Ambulatory Visit: Payer: Self-pay | Admitting: *Deleted

## 2017-02-23 DIAGNOSIS — R9389 Abnormal findings on diagnostic imaging of other specified body structures: Secondary | ICD-10-CM

## 2017-02-24 DIAGNOSIS — I10 Essential (primary) hypertension: Secondary | ICD-10-CM | POA: Diagnosis not present

## 2017-02-24 DIAGNOSIS — E559 Vitamin D deficiency, unspecified: Secondary | ICD-10-CM | POA: Diagnosis not present

## 2017-02-24 DIAGNOSIS — E1140 Type 2 diabetes mellitus with diabetic neuropathy, unspecified: Secondary | ICD-10-CM | POA: Diagnosis not present

## 2017-02-24 DIAGNOSIS — R938 Abnormal findings on diagnostic imaging of other specified body structures: Secondary | ICD-10-CM | POA: Diagnosis not present

## 2017-02-25 ENCOUNTER — Encounter: Payer: Self-pay | Admitting: Family Medicine

## 2017-02-25 LAB — BASIC METABOLIC PANEL
BUN/Creatinine Ratio: 28 (ref 12–28)
BUN: 16 mg/dL (ref 8–27)
CALCIUM: 10.3 mg/dL (ref 8.7–10.3)
CO2: 25 mmol/L (ref 20–29)
CREATININE: 0.58 mg/dL (ref 0.57–1.00)
Chloride: 106 mmol/L (ref 96–106)
GFR calc Af Amer: 115 mL/min/{1.73_m2} (ref >59)
GFR calc non Af Amer: 100 mL/min/{1.73_m2} (ref >59)
Glucose: 159 mg/dL — ABNORMAL HIGH (ref 65–99)
Potassium: 4.6 mmol/L (ref 3.5–5.2)
SODIUM: 143 mmol/L (ref 134–144)

## 2017-02-25 NOTE — Telephone Encounter (Signed)
It is very hard to predict how long this patient will be out of work I recommend that he do a work note that goes at least for 90 days. Whether or not she stays out shorter or longer than this is dependent upon the results of neurology evaluation as well as specialist evaluation of the spinal stenosis and whether or not sure need to have any type of injection surgery rehabilitation or physical therapy thank you

## 2017-02-26 ENCOUNTER — Telehealth: Payer: Self-pay | Admitting: Family Medicine

## 2017-02-26 LAB — PTH, INTACT AND CALCIUM
Calcium: 10.2 mg/dL (ref 8.7–10.3)
PTH: 64 pg/mL (ref 15–65)

## 2017-02-26 LAB — VITAMIN D 25 HYDROXY (VIT D DEFICIENCY, FRACTURES): VIT D 25 HYDROXY: 14.2 ng/mL — AB (ref 30.0–100.0)

## 2017-02-26 NOTE — Telephone Encounter (Signed)
Patient is requesting results to labwork.

## 2017-02-26 NOTE — Telephone Encounter (Signed)
Letter sent yesteday. I called and read letter to pt. Pt notified other bloodwork still pending.

## 2017-02-27 LAB — CBC WITH DIFFERENTIAL/PLATELET
BASOS: 0 %
Basophils Absolute: 0 10*3/uL (ref 0.0–0.2)
EOS (ABSOLUTE): 0.2 10*3/uL (ref 0.0–0.4)
Eos: 3 %
HEMATOCRIT: 45.6 % (ref 34.0–46.6)
Hemoglobin: 15.8 g/dL (ref 11.1–15.9)
IMMATURE GRANS (ABS): 0 10*3/uL (ref 0.0–0.1)
Immature Granulocytes: 0 %
LYMPHS: 29 %
Lymphocytes Absolute: 2 10*3/uL (ref 0.7–3.1)
MCH: 33.5 pg — AB (ref 26.6–33.0)
MCHC: 34.6 g/dL (ref 31.5–35.7)
MCV: 97 fL (ref 79–97)
Monocytes Absolute: 0.3 10*3/uL (ref 0.1–0.9)
Monocytes: 4 %
NEUTROS ABS: 4.4 10*3/uL (ref 1.4–7.0)
Neutrophils: 64 %
PLATELETS: 183 10*3/uL (ref 150–379)
RBC: 4.72 x10E6/uL (ref 3.77–5.28)
RDW: 13.6 % (ref 12.3–15.4)
WBC: 6.9 10*3/uL (ref 3.4–10.8)

## 2017-02-27 LAB — SEDIMENTATION RATE: Sed Rate: 18 mm/hr (ref 0–40)

## 2017-02-27 LAB — PROTEIN ELECTROPHORESIS, SERUM
A/G RATIO SPE: 1.1 (ref 0.7–1.7)
ALBUMIN ELP: 3.5 g/dL (ref 2.9–4.4)
Alpha 1: 0.2 g/dL (ref 0.0–0.4)
Alpha 2: 0.9 g/dL (ref 0.4–1.0)
Beta: 1.1 g/dL (ref 0.7–1.3)
GAMMA GLOBULIN: 1 g/dL (ref 0.4–1.8)
GLOBULIN, TOTAL: 3.2 g/dL (ref 2.2–3.9)
TOTAL PROTEIN: 6.7 g/dL (ref 6.0–8.5)

## 2017-02-27 LAB — HEPATIC FUNCTION PANEL
ALK PHOS: 91 IU/L (ref 39–117)
ALT: 21 IU/L (ref 0–32)
AST: 17 IU/L (ref 0–40)
Albumin: 4.1 g/dL (ref 3.6–4.8)
Bilirubin Total: 0.4 mg/dL (ref 0.0–1.2)
Bilirubin, Direct: 0.09 mg/dL (ref 0.00–0.40)

## 2017-03-01 DIAGNOSIS — M48062 Spinal stenosis, lumbar region with neurogenic claudication: Secondary | ICD-10-CM | POA: Diagnosis not present

## 2017-03-01 DIAGNOSIS — Z6841 Body Mass Index (BMI) 40.0 and over, adult: Secondary | ICD-10-CM | POA: Diagnosis not present

## 2017-03-02 ENCOUNTER — Other Ambulatory Visit: Payer: Self-pay | Admitting: Neurosurgery

## 2017-03-02 DIAGNOSIS — M48062 Spinal stenosis, lumbar region with neurogenic claudication: Secondary | ICD-10-CM

## 2017-03-06 ENCOUNTER — Telehealth: Payer: Self-pay | Admitting: Family Medicine

## 2017-03-06 DIAGNOSIS — B351 Tinea unguium: Secondary | ICD-10-CM | POA: Diagnosis not present

## 2017-03-06 DIAGNOSIS — M79671 Pain in right foot: Secondary | ICD-10-CM | POA: Diagnosis not present

## 2017-03-06 DIAGNOSIS — E11610 Type 2 diabetes mellitus with diabetic neuropathic arthropathy: Secondary | ICD-10-CM | POA: Diagnosis not present

## 2017-03-06 DIAGNOSIS — E1142 Type 2 diabetes mellitus with diabetic polyneuropathy: Secondary | ICD-10-CM | POA: Diagnosis not present

## 2017-03-06 NOTE — Telephone Encounter (Signed)
Patient had [employer] short term disability form faxed over. Please review ,fill in number 12. Date /sign in your box.

## 2017-03-07 DIAGNOSIS — M1712 Unilateral primary osteoarthritis, left knee: Secondary | ICD-10-CM | POA: Diagnosis not present

## 2017-03-07 DIAGNOSIS — M25552 Pain in left hip: Secondary | ICD-10-CM | POA: Diagnosis not present

## 2017-03-08 ENCOUNTER — Other Ambulatory Visit (HOSPITAL_COMMUNITY): Payer: Self-pay | Admitting: Neurosurgery

## 2017-03-08 ENCOUNTER — Ambulatory Visit (HOSPITAL_COMMUNITY)
Admission: RE | Admit: 2017-03-08 | Discharge: 2017-03-08 | Disposition: A | Discharge status: Home / Self Care | Payer: 59 | Source: Ambulatory Visit | Attending: Vascular Surgery | Admitting: Vascular Surgery

## 2017-03-08 DIAGNOSIS — I739 Peripheral vascular disease, unspecified: Secondary | ICD-10-CM

## 2017-03-08 DIAGNOSIS — Z029 Encounter for administrative examinations, unspecified: Secondary | ICD-10-CM

## 2017-03-09 NOTE — Telephone Encounter (Signed)
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 Because of significant pain in the legs along with numbness and weakness in her legs she cannot stand for any significant length of time. Because of her back pain she cannot sit for any significant length of time. She is not capable of doing the walking necessary for her job. She is not capable of doing any twisting lifting or bending. In addition to this the patient is undergoing evaluation by neurology and neurosurgery for further treatments. This disability will be for at least 3 months then be reevaluated at that time.

## 2017-03-12 ENCOUNTER — Telehealth: Payer: Self-pay | Admitting: Family Medicine

## 2017-03-12 NOTE — Telephone Encounter (Signed)
Patient short term disability keeps sending over forms. She was to see two specialist. One on 10/3 and the other on 10/10/. I think this form should be filled out by them because we have no recent notes to send I sent all with the last form.And Dr. Gerilyn Pilgrim was doing the MRI not our office. Form is in your box

## 2017-03-12 NOTE — Telephone Encounter (Signed)
This patient is currently out on disability. She is unable to do any significant standing sitting for any length of time because of severe neuropathy and pain in her legs. She is currently undergoing further testing by neurology with nerve conduction studies she has seen neurosurgery for lumbar spinal stenosis. They are concerned about thoracic involvement. They have scheduled a thoracic MRI. Currently this is pending. It is very difficult to say when this patient will be able to go back to work. There is a possibility that she may end up having to 5 disability. Preliminary early we are projecting January return to work this would give time for the specialist to do physical therapy injections or possible surgery. Thank you

## 2017-03-12 NOTE — Telephone Encounter (Signed)
I have dictated a short statement  That can be included with this disability form. Please inform the patient that it will be necessary for her to schedule a follow-up visit after she has undergone neurology assessment

## 2017-03-13 NOTE — Telephone Encounter (Signed)
Please close

## 2017-03-14 DIAGNOSIS — M1612 Unilateral primary osteoarthritis, left hip: Secondary | ICD-10-CM | POA: Diagnosis not present

## 2017-03-14 NOTE — Telephone Encounter (Signed)
As best I can tell it is now officially closed thank you

## 2017-03-16 ENCOUNTER — Ambulatory Visit
Admission: RE | Admit: 2017-03-16 | Discharge: 2017-03-16 | Disposition: A | Discharge status: Home / Self Care | Payer: 59 | Source: Ambulatory Visit | Attending: Neurosurgery | Admitting: Neurosurgery

## 2017-03-16 DIAGNOSIS — M48062 Spinal stenosis, lumbar region with neurogenic claudication: Secondary | ICD-10-CM

## 2017-03-16 DIAGNOSIS — M4804 Spinal stenosis, thoracic region: Secondary | ICD-10-CM | POA: Diagnosis not present

## 2017-03-19 ENCOUNTER — Telehealth: Payer: Self-pay | Admitting: *Deleted

## 2017-03-19 DIAGNOSIS — R410 Disorientation, unspecified: Principal | ICD-10-CM

## 2017-03-19 NOTE — Telephone Encounter (Signed)
Patient called stating she needs her hydrocodone refilled. Patient states it is 7.5 currently and she would like for it to be cut down to 5 and the same qty. Please advise [phone]  Patient also requested a referral to Dr Gerilyn Pilgrimoonquah for difficulties talking and confusion

## 2017-03-19 NOTE — Telephone Encounter (Signed)
Patient may have a prescription for hydrocodone 5 mg/325 mg, #120, one 4 times a day when necessary pain-stop hydrocodone 7.5 mg. Patient to keep appointment in early November. May have referral to neurology per request for difficulty speaking and confusion issue with this or any concern for possibility of stroke it would be wise for the patient to be seen here immediately or obviously ER if any strokelike condition

## 2017-03-20 MED ORDER — HYDROCODONE-ACETAMINOPHEN 5-325 MG PO TABS: 1.0000 | ORAL_TABLET | Freq: Four times a day (QID) | ORAL | 0 refills | Status: DC | PRN

## 2017-03-20 NOTE — Telephone Encounter (Signed)
Left message to return call( prescription printed and referral ordered in EPIC)

## 2017-03-20 NOTE — Telephone Encounter (Signed)
Patient notified and stated that her memory issue and difficulty speaking is a long term issue and she has no concerns of acute problem. Referral ordered in EPIC and prescription up front for pick up.

## 2017-03-22 ENCOUNTER — Other Ambulatory Visit: Payer: Self-pay | Admitting: *Deleted

## 2017-03-22 NOTE — Patient Outreach (Signed)
Received return e-mail from [name] at 9:30 am today stating she is out on medical leave due to bilateral leg pain and this was the reason she missed her [facility] on 02/05/17. She says she has been late in responding to this RNCM's emails because she is having trouble using the computer. She asked that she be contacted via her home number [phone] if an over the phone visit is needed.  She also advised that her most recent Hgb A1C was 5.8% when it was checked the last week of August. Per EPIC appointment record [name] has has numerous recent tests including an MRI of her lumbar and thoracic spine and also ABI's of her lower extremities.  Called and spoke with [name] and advised her of plan to call [name] in December to assess her health status at that time.  Bary RichardJanet S. Hauser RN,CCM,CDE [facility] Care Management Coordinator [facility] and [facility] Office Phone [phone] Office Fax [phone]

## 2017-03-23 MED FILL — HYDROCODON-APAP 5-325: 5-325 | 30 days supply | Qty: 120 | Fill #0

## 2017-03-27 ENCOUNTER — Encounter: Payer: Self-pay | Admitting: Family Medicine

## 2017-03-27 ENCOUNTER — Other Ambulatory Visit: Payer: Self-pay | Admitting: Family Medicine

## 2017-03-27 MED FILL — LISINOPRIL 5 MG TABLET: 5 | 90 days supply | Qty: 45 | Fill #3

## 2017-03-27 MED FILL — UNIFINE PENTIPS 31GX3/16: 31G X 5 MM | 30 days supply | Qty: 100 | Fill #0

## 2017-03-27 MED FILL — dilTIAZem HCL 120 MG TABS: 120 | 90 days supply | Qty: 90 | Fill #3

## 2017-03-27 MED FILL — LANTUS SOLOSTAR 100 UNITS/M: 100 | 28 days supply | Qty: 30 | Fill #5

## 2017-03-27 MED FILL — UNIFINE PENTIPS 31GX3/16": 31G X 5 MM | 30 days supply | Qty: 100 | Fill #0

## 2017-03-27 MED FILL — HUMALOG 100 UNITS/ML KWIKPE: 100 | 18 days supply | Qty: 15 | Fill #0

## 2017-03-27 MED FILL — LYRICA 200 MG CAPSULE: 200 | 90 days supply | Qty: 180 | Fill #0

## 2017-03-28 DIAGNOSIS — M48062 Spinal stenosis, lumbar region with neurogenic claudication: Secondary | ICD-10-CM | POA: Diagnosis not present

## 2017-03-28 DIAGNOSIS — I10 Essential (primary) hypertension: Secondary | ICD-10-CM | POA: Diagnosis not present

## 2017-03-28 DIAGNOSIS — Z6841 Body Mass Index (BMI) 40.0 and over, adult: Secondary | ICD-10-CM | POA: Diagnosis not present

## 2017-04-05 DIAGNOSIS — M1612 Unilateral primary osteoarthritis, left hip: Secondary | ICD-10-CM | POA: Diagnosis not present

## 2017-04-07 ENCOUNTER — Other Ambulatory Visit: Payer: Self-pay | Admitting: *Deleted

## 2017-04-07 NOTE — Patient Outreach (Signed)
Secure e-mail sent to Arline AspCindy advising her  that disease self-management services will be transitioned from the [facility] program to [facility] or [facility] in 2019. Also advised her  that a letter will be mailed to her home residence with details of this transition. Encouraged [name]  to contact this RNCM for further questions for concerns. Bary RichardJanet S. Hauser RN,CCM,CDE [facility] Care Management Coordinator [facility] and [facility] Office Phone [phone] Office Fax [phone]

## 2017-04-09 ENCOUNTER — Ambulatory Visit (INDEPENDENT_AMBULATORY_CARE_PROVIDER_SITE_OTHER): Payer: 59 | Admitting: Family Medicine

## 2017-04-09 ENCOUNTER — Encounter: Payer: Self-pay | Admitting: Family Medicine

## 2017-04-09 VITALS — BP 150/74 | Ht 70.0 in | Wt 321.0 lb

## 2017-04-09 DIAGNOSIS — E1140 Type 2 diabetes mellitus with diabetic neuropathy, unspecified: Secondary | ICD-10-CM | POA: Diagnosis not present

## 2017-04-09 DIAGNOSIS — Z79891 Long term (current) use of opiate analgesic: Secondary | ICD-10-CM | POA: Diagnosis not present

## 2017-04-09 DIAGNOSIS — E11610 Type 2 diabetes mellitus with diabetic neuropathic arthropathy: Secondary | ICD-10-CM

## 2017-04-09 MED ORDER — HYDROCODONE-ACETAMINOPHEN 5-325 MG PO TABS: 1.0000 | ORAL_TABLET | Freq: Four times a day (QID) | ORAL | 0 refills | Status: DC | PRN

## 2017-04-09 MED ORDER — HYDROCODONE-ACETAMINOPHEN 5-325 MG PO TABS: 1.0000 | ORAL_TABLET | Freq: Four times a day (QID) | ORAL | 0 refills | Status: AC | PRN

## 2017-04-09 NOTE — Progress Notes (Addendum)
Subjective:    Patient ID: Michele NettlesCynthia D Leser, female    DOB: [birth year], 61 y.o.   MRN: [id number]  HPI This patient was seen today for chronic pain  The medication list was reviewed and updated.   -Compliance with medication: Yes  - Number patient states they take daily: Three daily -when was the last dose patient took? This am around 7am.  The patient was advised the importance of maintaining medication and not using illegal substances with these.  Refills needed: No  The patient was educated that we can provide 3 monthly scripts for their medication, it is their responsibility to follow the instructions.  Side effects or complications from medications: No  Patient is aware that pain medications are meant to minimize the severity of the pain to allow their pain levels to improve to allow for better function. They are aware of that pain medications cannot totally remove their pain.  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  When she does sit at times she has to lay down to stretch her muscles therefore with her current level of issues she is not able to work  She has seen neurosurgery who diagnosed her with spinal stenosis she is seeing neurology who is doing nerve conduction studies for neuropathy she is also seen podiatry who is managing her chartcot foot   It should be noted that this patient is unable to walk any significant length or distances because of the  pain in her feet.  She is seen neurosurgery that diagnosed her with mild spinal stenosis She is going to be seen neurology coming up She is seeing Dr. Antony OdeaLucio for her knees and they have told her she has left hip arthritis radiating pain into her knees she also has podiatrist as stated above she uses a rolling walker with a cane and also a wheelchair   Review of Systems  Constitutional: Negative for activity change and appetite change.  HENT: Negative for congestion.   Respiratory: Negative for cough.   Cardiovascular: Negative for chest pain.  Gastrointestinal: Negative for abdominal pain and vomiting.  Skin: Negative for color change.  Neurological: Negative for weakness.  Psychiatric/Behavioral: Negative for confusion.       Objective:   Physical Exam  Constitutional: She appears well-nourished. No distress.  HENT:  Head: Normocephalic.  Right Ear: External ear normal.  Left Ear: External ear normal.  Eyes: Right eye exhibits no discharge. Left eye exhibits no discharge.  Neck: No tracheal deviation present.  Cardiovascular: Normal rate, regular rhythm and normal heart sounds.  No murmur heard. Pulmonary/Chest: Effort normal and breath sounds normal. No respiratory distress. She has no wheezes. She has no rales.  Musculoskeletal: She exhibits no edema.  Lymphadenopathy:    She has no cervical adenopathy.  Neurological: She is alert.  Psychiatric: Her behavior is normal.  Vitals reviewed.  This patient has foot deformity Charcot foot as  well as evidence of previous amputation does benefit from diabetic shoes       Assessment & Plan:  Chronic pain in the legs this is multifactorial neuropathy as well as Charcot foot as well as diabetic related neuropathy and also spinal stenosis patient would benefit from staying off of her feet.  I recommend short-term disability hopefully if her symptoms come under better control we will be able to get away from some of the medications and  return her back to work-there is a strong possibility patient will have long-term disability if her symptoms progress  The patient was seen today as part of a comprehensive visit regarding pain control. Patient's compliance with the medication as well as discussion regarding effectiveness was completed. Prescriptions were written. Patient was advised to follow-up in 3 months. The patient was assessed for any signs of severe side effects. The patient was advised to take the medicine as directed and to report to us if any side effect issues.  3 prescriptions pain medicine written drug registry was checked

## 2017-04-10 DIAGNOSIS — I10 Essential (primary) hypertension: Secondary | ICD-10-CM | POA: Diagnosis not present

## 2017-04-10 DIAGNOSIS — M797 Fibromyalgia: Secondary | ICD-10-CM | POA: Diagnosis not present

## 2017-04-10 DIAGNOSIS — M545 Low back pain: Secondary | ICD-10-CM | POA: Diagnosis not present

## 2017-04-13 LAB — TOXASSURE SELECT 13 (MW), URINE

## 2017-04-13 NOTE — Addendum Note (Signed)
Addended by: Lilyan PuntLUKING, Nil Bolser A on: 04/13/2017 02:51 PM   Modules accepted: Level of Service

## 2017-04-16 ENCOUNTER — Telehealth: Payer: Self-pay | Admitting: Family Medicine

## 2017-04-16 DIAGNOSIS — M79671 Pain in right foot: Secondary | ICD-10-CM | POA: Diagnosis not present

## 2017-04-16 DIAGNOSIS — E1142 Type 2 diabetes mellitus with diabetic polyneuropathy: Secondary | ICD-10-CM | POA: Diagnosis not present

## 2017-04-16 DIAGNOSIS — M25371 Other instability, right ankle: Secondary | ICD-10-CM | POA: Diagnosis not present

## 2017-04-16 MED ORDER — GLUCOSE BLOOD VI STRP: ORAL_STRIP | 5 refills | Status: DC

## 2017-04-16 MED FILL — HUMALOG 100 UNITS/ML KWIKPE: 100 | 18 days supply | Qty: 15 | Fill #1

## 2017-04-16 NOTE — Telephone Encounter (Signed)
Patient needing refills on Acu-check test strips advance plus called into [facility].

## 2017-04-16 NOTE — Addendum Note (Signed)
Addended by: Margaretha SheffieldBROWN, AUTUMN S on: 04/16/2017 10:06 AM   Modules accepted: Orders

## 2017-04-16 NOTE — Telephone Encounter (Signed)
Prescription sent electronically to pharmacy. Patient notified. 

## 2017-04-17 DIAGNOSIS — I10 Essential (primary) hypertension: Secondary | ICD-10-CM | POA: Diagnosis not present

## 2017-04-17 DIAGNOSIS — Z79899 Other long term (current) drug therapy: Secondary | ICD-10-CM | POA: Diagnosis not present

## 2017-04-24 ENCOUNTER — Other Ambulatory Visit: Payer: Self-pay | Admitting: *Deleted

## 2017-04-24 ENCOUNTER — Other Ambulatory Visit: Payer: Self-pay

## 2017-04-24 MED ORDER — GLUCOSE BLOOD VI STRP: ORAL_STRIP | 5 refills | Status: DC

## 2017-04-24 NOTE — Patient Outreach (Addendum)
Spoke with Michele Aspindy via phone advising her that disease self-management services will be transitioned from the [facility] to either [facility] or [facility] in 2019. Ensured that she completed the health risk assessment on the [email or web address] website. Also advised her that a letter will be mailed to the home residence with details of this transition. Will close case to [facility] To [facility]ness diabetes program. Michele RichardJanet S. Hauser RN,CCM,CDE [facility] Care Management Coordinator [facility] and [facility] Office Phone [phone] Office Fax [phone]

## 2017-04-25 MED FILL — FREESTYLE LITE METER: 30 days supply | Qty: 1 | Fill #0

## 2017-04-25 MED FILL — FREESTYLE LANCETS: 33 days supply | Qty: 100 | Fill #0

## 2017-04-25 MED FILL — FREESTYLE LITE TEST STRIP: 33 days supply | Qty: 100 | Fill #0

## 2017-04-27 MED FILL — HYDROCODON-APAP 5-325: 5-325 | 30 days supply | Qty: 120 | Fill #0

## 2017-05-01 ENCOUNTER — Other Ambulatory Visit (HOSPITAL_COMMUNITY): Payer: Self-pay | Admitting: Podiatry

## 2017-05-01 ENCOUNTER — Ambulatory Visit (HOSPITAL_COMMUNITY)
Admission: RE | Admit: 2017-05-01 | Discharge: 2017-05-01 | Disposition: A | Discharge status: Home / Self Care | Payer: 59 | Source: Ambulatory Visit | Attending: Podiatry | Admitting: Podiatry

## 2017-05-01 ENCOUNTER — Ambulatory Visit (INDEPENDENT_AMBULATORY_CARE_PROVIDER_SITE_OTHER): Payer: 59 | Admitting: Family Medicine

## 2017-05-01 ENCOUNTER — Encounter: Payer: Self-pay | Admitting: Family Medicine

## 2017-05-01 VITALS — BP 144/90 | Ht 70.0 in | Wt 314.0 lb

## 2017-05-01 DIAGNOSIS — L97529 Non-pressure chronic ulcer of other part of left foot with unspecified severity: Secondary | ICD-10-CM | POA: Diagnosis not present

## 2017-05-01 DIAGNOSIS — S99922A Unspecified injury of left foot, initial encounter: Secondary | ICD-10-CM | POA: Diagnosis not present

## 2017-05-01 DIAGNOSIS — E119 Type 2 diabetes mellitus without complications: Secondary | ICD-10-CM

## 2017-05-01 DIAGNOSIS — G894 Chronic pain syndrome: Secondary | ICD-10-CM | POA: Diagnosis not present

## 2017-05-01 DIAGNOSIS — M19079 Primary osteoarthritis, unspecified ankle and foot: Secondary | ICD-10-CM

## 2017-05-01 DIAGNOSIS — E11621 Type 2 diabetes mellitus with foot ulcer: Secondary | ICD-10-CM | POA: Insufficient documentation

## 2017-05-01 DIAGNOSIS — M79671 Pain in right foot: Secondary | ICD-10-CM | POA: Diagnosis not present

## 2017-05-01 DIAGNOSIS — E11610 Type 2 diabetes mellitus with diabetic neuropathic arthropathy: Secondary | ICD-10-CM | POA: Diagnosis not present

## 2017-05-01 DIAGNOSIS — M19071 Primary osteoarthritis, right ankle and foot: Secondary | ICD-10-CM | POA: Insufficient documentation

## 2017-05-01 DIAGNOSIS — L97524 Non-pressure chronic ulcer of other part of left foot with necrosis of bone: Secondary | ICD-10-CM | POA: Insufficient documentation

## 2017-05-01 DIAGNOSIS — M25571 Pain in right ankle and joints of right foot: Secondary | ICD-10-CM | POA: Diagnosis not present

## 2017-05-01 DIAGNOSIS — S99921A Unspecified injury of right foot, initial encounter: Secondary | ICD-10-CM | POA: Diagnosis not present

## 2017-05-01 LAB — POCT GLYCOSYLATED HEMOGLOBIN (HGB A1C): HEMOGLOBIN A1C: 4.9

## 2017-05-01 MED FILL — DOXYCYCLINE HYCLATE 100 MG: 100 | 10 days supply | Qty: 20 | Fill #0

## 2017-05-01 NOTE — Progress Notes (Addendum)
Subjective:    Patient ID: [name], female    DOB: 08/31/1955, 61 y.o.   MRN: [id number]  HPIFollow up ankle brace and wants to discuss disability.   Pt wanted A1C done today. Blood sugar running around 100.  Results for orders placed or performed in visit on 05/01/17  POCT glycosylated hemoglobin (Hb A1C)  Result Value Ref Range   Hemoglobin A1C 4.9   she states her sugars overall been looking really well she denies any low sugar spells-she is trying to eat healthy  Dr.McKinney-she sees him on a regular basis for Charcot foot and having chronic foot pain numbness neuropathy and ankle pain.  Recently did some x-rays of her ankles and feet.  These were reviewed with the patient today.  Severe osteoarthritis and degenerative changes as well as shifting in the alignment is noted.  Dr.Alusio- left hip arthritis-she sees him on a regular basis for knee pain and hip pain more than likely will have to have a hip replacement but this will be based upon her losing some weight  Dr.Doonquah-she is seeing him having nerve conduction studies await the results of this has neuropathy in her legs-  Dr.Henry Pool-she is seen neurosurgery they diagnosed her with spinal stenosis but stated it was not severe enough to require surgery  She does see her psychiatrist on a regular basis.  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  She does have some forgetfulness.  She also has difficulty focusing.  She also has  difficulty processing.  Ataxia-she does relate that she has a difficult time balancing of walking and finds herself feeling like she is going to fall she uses a cane she cannot feel her right foot she has a brace on it she has a difficult time sensing position in her left ankle.  She also has severe neuropathy of her lower legs and feet causing painful neuropathy as well as numbness and loss of position sense.  Weakness/pain-she relates significant weakness when she stands up significant weakness with walking-she relates that she finds herself feeling tired rundown unable to stand more than 10 minutes at a time primarily because of the severe pain she is having in her ankles feet and knees but some pain she is having in her hip as well pain medicine helps but the pain medicine makes it difficult for her to focus and concentrate she also states her pain levels are such to where she cannot focus.  In addition to this she finds that she can walk 5-10 minutes then she must sit down and rest before she can get up and walk more even more and she is quite limited and has to use handicap parking plus also she cannot cover long distances at all.  She finds herself feeling unsteady on uneven surfaces and also trying to walk up steps are nearly impossible  Fatigue significant fatigue and tiredness on a ongoing basis she is unable to go through a full  day without having to lay down and rest she finds her self being able to sit for 45 minutes to an hour at a time then she has to lay down to rest  Long term disability-I have advised this patient that she should go ahead and file for long-term disability.  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  Because of the severe pain and arthritic troubles she is having with her legs specifically ankle feet and knees plus also the multiple medication she is on this is resulting in severe pain which limits her ability to move around.  It also limits her ability to be on her  feet and therefore she is disabled.  Please see above for further details.  In addition to this it is not likely that this patient will ever get better.  I believe her disability is permanent.  I have encouraged her to go ahead and seek long-term disability with workplace and also file for long-term disability with Social Security.  She would not be able to work gainfully employed.  There is no way that she could do a 40-hour workweek.  She cannot do an 8-hour work days.  Having to undergo multiple sitting spells and laying down spells would make working impossible.    Review of Systems She denies headaches dysphagia shortness of breath wheezing vomiting diarrhea fever chest pain tightness she does relate ankle pain foot pain knee pain ankle pain fatigue tiredness    Objective:   Physical Exam Lungs are clear respiratory rate normal heart regular pulse normal morbid obesity is noted extremities Charcot foot along with previous amputation noted arthralgias in the ankles and feet subjective but present  X-rays were reviewed with the patient of her ankle and feet show severe arthritis  25 minutes was spent with the patient. Greater than half the time was spent in discussion and answering questions and counseling regarding the issues that the patient came in for today.      Assessment & Plan:  Significant disability Charcot foot Spinal stenosis Severe arthritis of both ankles Chronic pain syndrome requiring hydrocodone Peripheral neuropathy/painful diabetic neuropathy Depression Cognitive dysfunction related to chronic pain as well as medications In my opinion patient has permanent disability see discussion above  I have advised her to discuss with her other specialists her disability as well.

## 2017-05-03 ENCOUNTER — Encounter: Payer: Self-pay | Admitting: Family Medicine

## 2017-05-03 ENCOUNTER — Telehealth: Payer: Self-pay | Admitting: Family Medicine

## 2017-05-03 NOTE — Telephone Encounter (Signed)
Patient was seen 12/4 and stated she gave you some disability forms to be filled out when she saw you and wanting to know if they were ready and if there was a charge.

## 2017-05-03 NOTE — Telephone Encounter (Signed)
To the best of my knowledge she was requesting a letter of support for disability.  What I did was dictated a very thorough note reflecting the disability.  She may have a copy of this.  I will also do a letter.([name]-if there were additional forms I do not recall them) no charge for what I did

## 2017-05-04 NOTE — Telephone Encounter (Signed)
[name] dictated a letter for her disability and a very detailed documentation of her most recent office visit.  This may be sent to her insurance carrier for disability.  Also the patient may have a copy of this.  There is no additional charge.

## 2017-05-14 ENCOUNTER — Ambulatory Visit (INDEPENDENT_AMBULATORY_CARE_PROVIDER_SITE_OTHER): Payer: 59 | Admitting: Family Medicine

## 2017-05-14 ENCOUNTER — Encounter: Payer: Self-pay | Admitting: Family Medicine

## 2017-05-14 VITALS — BP 156/90 | Ht 70.0 in | Wt 302.0 lb

## 2017-05-14 DIAGNOSIS — M19079 Primary osteoarthritis, unspecified ankle and foot: Secondary | ICD-10-CM | POA: Diagnosis not present

## 2017-05-14 NOTE — Progress Notes (Signed)
   Subjective:    Patient ID: [name], female    DOB: [birth year], 61 y.o.   MRN: [id number]  HPI Patient is here today to discuss disability forms. Patient continues to be disabled because of her back issues leg issues have been issues as well as her foot issues.  Her physical condition has not improved since her last being seen.  She is being followed by Dr. Nolen MuMcKinney for podiatry.  He is [place]  Lurene Shadowimothy Volger, but that doctor is going to be doing some testing to determine whether or not surgery would be beneficial  Also is being followed by Dr.Alusio who is recommended a left hip replacement but wants her to lose an additional 26 pounds before doing the surgery she is watching her portions in trying to make good healthy choices  She also relates significant amount of fatigue and tiredness she denies any chest tightness pressure pain or shortness of breath.  She does relate that she gives out on energy.  She denies angina symptoms.  She does relate low back pain hip pain leg and foot pain.  This is not improved since her last visit please see documentation from her last visit it is exactly the same  She is supposed to see neurology for follow-up so they can do nerve conduction testing their machine broke on last visit Review of Systems Patient relates a fair amount of hip pain low back pain numbness tingling into the legs foot pain discomfort especially with walking    Objective:   Physical Exam Lungs are clear heart regular extremities no edema skin warm dry       Assessment & Plan:  Patient has multiple issues going on Charcot foot, severe ankle arthritis, left hip arthritis, low back pain with lumbar stenosis this patient is not capable of working.  It is necessary for her to see the neurologist.  It is also necessary to see the new specialist in 1 on January 8 regarding her Charcot foot and ankle arthritis.  It is also necessary  for her to have left hip surgery.  Due to multiple different health reasons she is not capable of working currently.  In my opinion it will be multiple weeks before she may be able to return to work if ever.  I would recommend granting her a work excuse from now through January 31 this is preliminary patient does have a follow-up regarding pain management in early February

## 2017-05-15 DIAGNOSIS — M79671 Pain in right foot: Secondary | ICD-10-CM | POA: Diagnosis not present

## 2017-05-15 DIAGNOSIS — L97519 Non-pressure chronic ulcer of other part of right foot with unspecified severity: Secondary | ICD-10-CM | POA: Diagnosis not present

## 2017-05-15 DIAGNOSIS — L97529 Non-pressure chronic ulcer of other part of left foot with unspecified severity: Secondary | ICD-10-CM | POA: Diagnosis not present

## 2017-05-15 DIAGNOSIS — M25571 Pain in right ankle and joints of right foot: Secondary | ICD-10-CM | POA: Diagnosis not present

## 2017-05-16 ENCOUNTER — Emergency Department (HOSPITAL_COMMUNITY): Payer: 59

## 2017-05-16 ENCOUNTER — Inpatient Hospital Stay (HOSPITAL_COMMUNITY): Admission: EM | Disposition: E | Payer: Self-pay | Source: Home / Self Care | Attending: Cardiovascular Disease

## 2017-05-16 ENCOUNTER — Inpatient Hospital Stay (HOSPITAL_COMMUNITY)
Admission: EM | Admit: 2017-05-16 | Discharge: 2017-05-29 | DRG: 246 | Disposition: E | Payer: 59 | Attending: Cardiovascular Disease | Admitting: Cardiovascular Disease

## 2017-05-16 ENCOUNTER — Inpatient Hospital Stay (HOSPITAL_COMMUNITY): Payer: 59

## 2017-05-16 DIAGNOSIS — Z66 Do not resuscitate: Secondary | ICD-10-CM | POA: Diagnosis present

## 2017-05-16 DIAGNOSIS — J9600 Acute respiratory failure, unspecified whether with hypoxia or hypercapnia: Secondary | ICD-10-CM | POA: Diagnosis present

## 2017-05-16 DIAGNOSIS — Y838 Other surgical procedures as the cause of abnormal reaction of the patient, or of later complication, without mention of misadventure at the time of the procedure: Secondary | ICD-10-CM | POA: Diagnosis not present

## 2017-05-16 DIAGNOSIS — R4020 Unspecified coma: Secondary | ICD-10-CM | POA: Diagnosis not present

## 2017-05-16 DIAGNOSIS — Z833 Family history of diabetes mellitus: Secondary | ICD-10-CM

## 2017-05-16 DIAGNOSIS — F17210 Nicotine dependence, cigarettes, uncomplicated: Secondary | ICD-10-CM | POA: Diagnosis present

## 2017-05-16 DIAGNOSIS — Z90710 Acquired absence of both cervix and uterus: Secondary | ICD-10-CM

## 2017-05-16 DIAGNOSIS — I10 Essential (primary) hypertension: Secondary | ICD-10-CM | POA: Diagnosis present

## 2017-05-16 DIAGNOSIS — I2121 ST elevation (STEMI) myocardial infarction involving left circumflex coronary artery: Secondary | ICD-10-CM | POA: Diagnosis not present

## 2017-05-16 DIAGNOSIS — F419 Anxiety disorder, unspecified: Secondary | ICD-10-CM | POA: Diagnosis present

## 2017-05-16 DIAGNOSIS — R570 Cardiogenic shock: Secondary | ICD-10-CM

## 2017-05-16 DIAGNOSIS — I6523 Occlusion and stenosis of bilateral carotid arteries: Secondary | ICD-10-CM | POA: Diagnosis present

## 2017-05-16 DIAGNOSIS — Z7982 Long term (current) use of aspirin: Secondary | ICD-10-CM

## 2017-05-16 DIAGNOSIS — E11610 Type 2 diabetes mellitus with diabetic neuropathic arthropathy: Secondary | ICD-10-CM | POA: Diagnosis present

## 2017-05-16 DIAGNOSIS — E785 Hyperlipidemia, unspecified: Secondary | ICD-10-CM | POA: Diagnosis present

## 2017-05-16 DIAGNOSIS — I2510 Atherosclerotic heart disease of native coronary artery without angina pectoris: Secondary | ICD-10-CM | POA: Diagnosis not present

## 2017-05-16 DIAGNOSIS — Z515 Encounter for palliative care: Secondary | ICD-10-CM | POA: Diagnosis not present

## 2017-05-16 DIAGNOSIS — I2119 ST elevation (STEMI) myocardial infarction involving other coronary artery of inferior wall: Principal | ICD-10-CM | POA: Diagnosis present

## 2017-05-16 DIAGNOSIS — E1165 Type 2 diabetes mellitus with hyperglycemia: Secondary | ICD-10-CM | POA: Diagnosis present

## 2017-05-16 DIAGNOSIS — Z89421 Acquired absence of other right toe(s): Secondary | ICD-10-CM

## 2017-05-16 DIAGNOSIS — I469 Cardiac arrest, cause unspecified: Secondary | ICD-10-CM | POA: Diagnosis present

## 2017-05-16 DIAGNOSIS — Z79899 Other long term (current) drug therapy: Secondary | ICD-10-CM

## 2017-05-16 DIAGNOSIS — Z888 Allergy status to other drugs, medicaments and biological substances status: Secondary | ICD-10-CM

## 2017-05-16 DIAGNOSIS — I97610 Postprocedural hemorrhage and hematoma of a circulatory system organ or structure following a cardiac catheterization: Secondary | ICD-10-CM | POA: Diagnosis not present

## 2017-05-16 DIAGNOSIS — I213 ST elevation (STEMI) myocardial infarction of unspecified site: Secondary | ICD-10-CM | POA: Diagnosis present

## 2017-05-16 DIAGNOSIS — G8929 Other chronic pain: Secondary | ICD-10-CM | POA: Diagnosis present

## 2017-05-16 DIAGNOSIS — Z4659 Encounter for fitting and adjustment of other gastrointestinal appliance and device: Secondary | ICD-10-CM

## 2017-05-16 DIAGNOSIS — Z96653 Presence of artificial knee joint, bilateral: Secondary | ICD-10-CM | POA: Diagnosis present

## 2017-05-16 DIAGNOSIS — Z87891 Personal history of nicotine dependence: Secondary | ICD-10-CM | POA: Diagnosis not present

## 2017-05-16 DIAGNOSIS — R001 Bradycardia, unspecified: Secondary | ICD-10-CM | POA: Diagnosis not present

## 2017-05-16 DIAGNOSIS — E118 Type 2 diabetes mellitus with unspecified complications: Secondary | ICD-10-CM

## 2017-05-16 DIAGNOSIS — R402312 Coma scale, best motor response, none, at arrival to emergency department: Secondary | ICD-10-CM | POA: Diagnosis present

## 2017-05-16 DIAGNOSIS — J9690 Respiratory failure, unspecified, unspecified whether with hypoxia or hypercapnia: Secondary | ICD-10-CM

## 2017-05-16 DIAGNOSIS — F329 Major depressive disorder, single episode, unspecified: Secondary | ICD-10-CM | POA: Diagnosis present

## 2017-05-16 DIAGNOSIS — Z9049 Acquired absence of other specified parts of digestive tract: Secondary | ICD-10-CM

## 2017-05-16 DIAGNOSIS — K439 Ventral hernia without obstruction or gangrene: Secondary | ICD-10-CM | POA: Diagnosis present

## 2017-05-16 DIAGNOSIS — E872 Acidosis: Secondary | ICD-10-CM | POA: Diagnosis not present

## 2017-05-16 DIAGNOSIS — R402112 Coma scale, eyes open, never, at arrival to emergency department: Secondary | ICD-10-CM | POA: Diagnosis present

## 2017-05-16 DIAGNOSIS — E1140 Type 2 diabetes mellitus with diabetic neuropathy, unspecified: Secondary | ICD-10-CM | POA: Diagnosis present

## 2017-05-16 DIAGNOSIS — Z794 Long term (current) use of insulin: Secondary | ICD-10-CM | POA: Diagnosis not present

## 2017-05-16 DIAGNOSIS — M797 Fibromyalgia: Secondary | ICD-10-CM | POA: Diagnosis present

## 2017-05-16 DIAGNOSIS — Z6841 Body Mass Index (BMI) 40.0 and over, adult: Secondary | ICD-10-CM | POA: Diagnosis not present

## 2017-05-16 DIAGNOSIS — Z89422 Acquired absence of other left toe(s): Secondary | ICD-10-CM

## 2017-05-16 DIAGNOSIS — Z885 Allergy status to narcotic agent status: Secondary | ICD-10-CM

## 2017-05-16 DIAGNOSIS — J9601 Acute respiratory failure with hypoxia: Secondary | ICD-10-CM

## 2017-05-16 DIAGNOSIS — D72829 Elevated white blood cell count, unspecified: Secondary | ICD-10-CM | POA: Diagnosis not present

## 2017-05-16 DIAGNOSIS — I6529 Occlusion and stenosis of unspecified carotid artery: Secondary | ICD-10-CM | POA: Diagnosis present

## 2017-05-16 DIAGNOSIS — N179 Acute kidney failure, unspecified: Secondary | ICD-10-CM | POA: Diagnosis present

## 2017-05-16 DIAGNOSIS — D62 Acute posthemorrhagic anemia: Secondary | ICD-10-CM | POA: Diagnosis not present

## 2017-05-16 DIAGNOSIS — T45525A Adverse effect of antithrombotic drugs, initial encounter: Secondary | ICD-10-CM

## 2017-05-16 DIAGNOSIS — Z8249 Family history of ischemic heart disease and other diseases of the circulatory system: Secondary | ICD-10-CM

## 2017-05-16 DIAGNOSIS — E1151 Type 2 diabetes mellitus with diabetic peripheral angiopathy without gangrene: Secondary | ICD-10-CM

## 2017-05-16 DIAGNOSIS — Z8542 Personal history of malignant neoplasm of other parts of uterus: Secondary | ICD-10-CM

## 2017-05-16 DIAGNOSIS — I4901 Ventricular fibrillation: Secondary | ICD-10-CM

## 2017-05-16 DIAGNOSIS — I462 Cardiac arrest due to underlying cardiac condition: Secondary | ICD-10-CM

## 2017-05-16 DIAGNOSIS — IMO0001 Reserved for inherently not codable concepts without codable children: Secondary | ICD-10-CM

## 2017-05-16 DIAGNOSIS — R0602 Shortness of breath: Secondary | ICD-10-CM | POA: Diagnosis not present

## 2017-05-16 DIAGNOSIS — I97618 Postprocedural hemorrhage and hematoma of a circulatory system organ or structure following other circulatory system procedure: Secondary | ICD-10-CM

## 2017-05-16 DIAGNOSIS — Z4682 Encounter for fitting and adjustment of non-vascular catheter: Secondary | ICD-10-CM | POA: Diagnosis not present

## 2017-05-16 HISTORY — PX: CORONARY/GRAFT ACUTE MI REVASCULARIZATION: CATH118305

## 2017-05-16 HISTORY — PX: TEMPORARY PACEMAKER: CATH118268

## 2017-05-16 HISTORY — PX: CORONARY THROMBECTOMY: CATH118304

## 2017-05-16 HISTORY — PX: LEFT HEART CATH AND CORONARY ANGIOGRAPHY: CATH118249

## 2017-05-16 LAB — BASIC METABOLIC PANEL
ANION GAP: 13 (ref 5–15)
Anion gap: 21 — ABNORMAL HIGH (ref 5–15)
BUN: 21 mg/dL — ABNORMAL HIGH (ref 6–20)
BUN: 22 mg/dL — ABNORMAL HIGH (ref 6–20)
CALCIUM: 10.2 mg/dL (ref 8.9–10.3)
CHLORIDE: 112 mmol/L — AB (ref 101–111)
CO2: 19 mmol/L — ABNORMAL LOW (ref 22–32)
CO2: 12 mmol/L — AB (ref 22–32)
CREATININE: 1.32 mg/dL — AB (ref 0.44–1.00)
CREATININE: 1.58 mg/dL — AB (ref 0.44–1.00)
Calcium: 9.7 mg/dL (ref 8.9–10.3)
Chloride: 110 mmol/L (ref 101–111)
GFR calc non Af Amer: 34 mL/min — ABNORMAL LOW (ref >60)
GFR, EST AFRICAN AMERICAN: 49 mL/min — AB (ref >60)
GFR, EST AFRICAN AMERICAN: 40 mL/min — AB (ref >60)
GFR, EST NON AFRICAN AMERICAN: 43 mL/min — AB (ref >60)
GLUCOSE: 451 mg/dL — AB (ref 65–99)
Glucose, Bld: 242 mg/dL — ABNORMAL HIGH (ref 65–99)
Potassium: 3.5 mmol/L (ref 3.5–5.1)
Potassium: 3.2 mmol/L — ABNORMAL LOW (ref 3.5–5.1)
Sodium: 142 mmol/L (ref 135–145)
Sodium: 145 mmol/L (ref 135–145)

## 2017-05-16 LAB — URINALYSIS, MICROSCOPIC (REFLEX)

## 2017-05-16 LAB — POCT I-STAT 3, ART BLOOD GAS (G3+)
ACID-BASE DEFICIT: 6 mmol/L — AB (ref 0.0–2.0)
ACID-BASE DEFICIT: 8 mmol/L — AB (ref 0.0–2.0)
ACID-BASE DEFICIT: 2 mmol/L (ref 0.0–2.0)
ACID-BASE DEFICIT: 7 mmol/L — AB (ref 0.0–2.0)
ACID-BASE DEFICIT: 20 mmol/L — AB (ref 0.0–2.0)
Acid-Base Excess: 1 mmol/L (ref 0.0–2.0)
Acid-base deficit: 3 mmol/L — ABNORMAL HIGH (ref 0.0–2.0)
BICARBONATE: 23.5 mmol/L (ref 20.0–28.0)
BICARBONATE: 9.7 mmol/L — AB (ref 20.0–28.0)
Bicarbonate: 19.5 mmol/L — ABNORMAL LOW (ref 20.0–28.0)
Bicarbonate: 20.5 mmol/L (ref 20.0–28.0)
Bicarbonate: 26.3 mmol/L (ref 20.0–28.0)
Bicarbonate: 20.7 mmol/L (ref 20.0–28.0)
Bicarbonate: 18.2 mmol/L — ABNORMAL LOW (ref 20.0–28.0)
O2 SAT: 100 %
O2 SAT: 100 %
O2 SAT: 90 %
O2 Saturation: 100 %
O2 Saturation: 100 %
O2 Saturation: 100 %
O2 Saturation: 99 %
PCO2 ART: 42.3 mmHg (ref 32.0–48.0)
PH ART: 7.218 — AB (ref 7.350–7.450)
PH ART: 7.402 (ref 7.350–7.450)
PH ART: 7.425 (ref 7.350–7.450)
PO2 ART: 409 mmHg — AB (ref 83.0–108.0)
TCO2: 21 mmol/L — ABNORMAL LOW (ref 22–32)
TCO2: 22 mmol/L (ref 22–32)
TCO2: 28 mmol/L (ref 22–32)
TCO2: 25 mmol/L (ref 22–32)
TCO2: 22 mmol/L (ref 22–32)
TCO2: 19 mmol/L — ABNORMAL LOW (ref 22–32)
TCO2: 11 mmol/L — AB (ref 22–32)
pCO2 arterial: 43.8 mmHg (ref 32.0–48.0)
pCO2 arterial: 47.9 mmHg (ref 32.0–48.0)
pCO2 arterial: 40.2 mmHg (ref 32.0–48.0)
pCO2 arterial: 31.6 mmHg — ABNORMAL LOW (ref 32.0–48.0)
pCO2 arterial: 32.4 mmHg (ref 32.0–48.0)
pCO2 arterial: 36.1 mmHg (ref 32.0–48.0)
pH, Arterial: 7.278 — ABNORMAL LOW (ref 7.350–7.450)
pH, Arterial: 7.368 (ref 7.350–7.450)
pH, Arterial: 7.346 — ABNORMAL LOW (ref 7.350–7.450)
pH, Arterial: 7.039 — CL (ref 7.350–7.450)
pO2, Arterial: 348 mmHg — ABNORMAL HIGH (ref 83.0–108.0)
pO2, Arterial: 427 mmHg — ABNORMAL HIGH (ref 83.0–108.0)
pO2, Arterial: 462 mmHg — ABNORMAL HIGH (ref 83.0–108.0)
pO2, Arterial: 329 mmHg — ABNORMAL HIGH (ref 83.0–108.0)
pO2, Arterial: 129 mmHg — ABNORMAL HIGH (ref 83.0–108.0)
pO2, Arterial: 85 mmHg (ref 83.0–108.0)

## 2017-05-16 LAB — URINALYSIS, ROUTINE W REFLEX MICROSCOPIC
Bilirubin Urine: NEGATIVE
GLUCOSE, UA: 250 mg/dL — AB
KETONES UR: NEGATIVE mg/dL
LEUKOCYTES UA: NEGATIVE
Nitrite: NEGATIVE
PH: 5.5 (ref 5.0–8.0)
Protein, ur: >300 mg/dL — AB
Specific Gravity, Urine: <1.005 — ABNORMAL LOW (ref 1.005–1.030)

## 2017-05-16 LAB — COMPREHENSIVE METABOLIC PANEL
ALBUMIN: 3.4 g/dL — AB (ref 3.5–5.0)
ALK PHOS: 73 U/L (ref 38–126)
ALT: 90 U/L — AB (ref 14–54)
AST: 143 U/L — AB (ref 15–41)
Anion gap: 14 (ref 5–15)
BILIRUBIN TOTAL: 0.7 mg/dL (ref 0.3–1.2)
BUN: 15 mg/dL (ref 6–20)
CALCIUM: 10.7 mg/dL — AB (ref 8.9–10.3)
CO2: 17 mmol/L — ABNORMAL LOW (ref 22–32)
CREATININE: 1.07 mg/dL — AB (ref 0.44–1.00)
Chloride: 104 mmol/L (ref 101–111)
GFR calc Af Amer: >60 mL/min (ref >60)
GFR, EST NON AFRICAN AMERICAN: 55 mL/min — AB (ref >60)
GLUCOSE: 237 mg/dL — AB (ref 65–99)
Potassium: 3.5 mmol/L (ref 3.5–5.1)
Sodium: 135 mmol/L (ref 135–145)
TOTAL PROTEIN: 6.2 g/dL — AB (ref 6.5–8.1)

## 2017-05-16 LAB — CBC
HCT: 32.7 % — ABNORMAL LOW (ref 36.0–46.0)
HEMATOCRIT: 31.5 % — AB (ref 36.0–46.0)
HEMOGLOBIN: 10.8 g/dL — AB (ref 12.0–15.0)
Hemoglobin: 10.3 g/dL — ABNORMAL LOW (ref 12.0–15.0)
MCH: 32.8 pg (ref 26.0–34.0)
MCH: 32.6 pg (ref 26.0–34.0)
MCHC: 33 g/dL (ref 30.0–36.0)
MCHC: 32.7 g/dL (ref 30.0–36.0)
MCV: 99.4 fL (ref 78.0–100.0)
MCV: 99.7 fL (ref 78.0–100.0)
PLATELETS: 185 10*3/uL (ref 150–400)
Platelets: 121 10*3/uL — ABNORMAL LOW (ref 150–400)
RBC: 3.29 MIL/uL — AB (ref 3.87–5.11)
RBC: 3.16 MIL/uL — AB (ref 3.87–5.11)
RDW: 13.6 % (ref 11.5–15.5)
RDW: 14.1 % (ref 11.5–15.5)
WBC: 21 10*3/uL — AB (ref 4.0–10.5)
WBC: 19.8 10*3/uL — AB (ref 4.0–10.5)

## 2017-05-16 LAB — LIPASE, BLOOD: LIPASE: 53 U/L — AB (ref 11–51)

## 2017-05-16 LAB — LACTIC ACID, PLASMA: LACTIC ACID, VENOUS: 3 mmol/L — AB (ref 0.5–1.9)

## 2017-05-16 LAB — POCT I-STAT, CHEM 8
BUN: 19 mg/dL (ref 6–20)
CALCIUM ION: 1.47 mmol/L — AB (ref 1.15–1.40)
CREATININE: 0.8 mg/dL (ref 0.44–1.00)
Chloride: 108 mmol/L (ref 101–111)
Glucose, Bld: 227 mg/dL — ABNORMAL HIGH (ref 65–99)
HEMATOCRIT: 45 % (ref 36.0–46.0)
HEMOGLOBIN: 15.3 g/dL — AB (ref 12.0–15.0)
Potassium: 3.4 mmol/L — ABNORMAL LOW (ref 3.5–5.1)
SODIUM: 143 mmol/L (ref 135–145)
TCO2: 23 mmol/L (ref 22–32)

## 2017-05-16 LAB — CBC WITH DIFFERENTIAL/PLATELET
BASOS PCT: 0 %
Basophils Absolute: 0.1 10*3/uL (ref 0.0–0.1)
EOS ABS: 0.3 10*3/uL (ref 0.0–0.7)
EOS PCT: 1 %
HCT: 46.1 % — ABNORMAL HIGH (ref 36.0–46.0)
HEMOGLOBIN: 15.7 g/dL — AB (ref 12.0–15.0)
LYMPHS ABS: 4.9 10*3/uL — AB (ref 0.7–4.0)
Lymphocytes Relative: 21 %
MCH: 33.4 pg (ref 26.0–34.0)
MCHC: 34.1 g/dL (ref 30.0–36.0)
MCV: 98.1 fL (ref 78.0–100.0)
MONO ABS: 0.8 10*3/uL (ref 0.1–1.0)
MONOS PCT: 3 %
NEUTROS PCT: 75 %
Neutro Abs: 17.3 10*3/uL — ABNORMAL HIGH (ref 1.7–7.7)
Platelets: 180 10*3/uL (ref 150–400)
RBC: 4.7 MIL/uL (ref 3.87–5.11)
RDW: 13.2 % (ref 11.5–15.5)
WBC: 23.3 10*3/uL — ABNORMAL HIGH (ref 4.0–10.5)

## 2017-05-16 LAB — POCT I-STAT 4, (NA,K, GLUC, HGB,HCT)
Glucose, Bld: 431 mg/dL — ABNORMAL HIGH (ref 65–99)
HCT: 27 % — ABNORMAL LOW (ref 36.0–46.0)
Hemoglobin: 9.2 g/dL — ABNORMAL LOW (ref 12.0–15.0)
POTASSIUM: 3.2 mmol/L — AB (ref 3.5–5.1)
SODIUM: 147 mmol/L — AB (ref 135–145)

## 2017-05-16 LAB — POCT I-STAT TROPONIN I: Troponin i, poc: 7.2 ng/mL (ref 0.00–0.08)

## 2017-05-16 LAB — TROPONIN I

## 2017-05-16 LAB — PREPARE RBC (CROSSMATCH)

## 2017-05-16 LAB — I-STAT CG4 LACTIC ACID, ED: LACTIC ACID, VENOUS: 7.7 mmol/L — AB (ref 0.5–1.9)

## 2017-05-16 LAB — GLUCOSE, CAPILLARY: Glucose-Capillary: 280 mg/dL — ABNORMAL HIGH (ref 65–99)

## 2017-05-16 LAB — TSH: TSH: 0.956 u[IU]/mL (ref 0.350–4.500)

## 2017-05-16 LAB — ABO/RH: ABO/RH(D): B POS

## 2017-05-16 LAB — POCT ACTIVATED CLOTTING TIME
ACTIVATED CLOTTING TIME: 560 s
ACTIVATED CLOTTING TIME: 461 s

## 2017-05-16 LAB — PROTIME-INR
INR: 5.5 — AB
PROTHROMBIN TIME: 49.6 s — AB (ref 11.4–15.2)

## 2017-05-16 LAB — MAGNESIUM: Magnesium: 1.5 mg/dL — ABNORMAL LOW (ref 1.7–2.4)

## 2017-05-16 LAB — BRAIN NATRIURETIC PEPTIDE: B Natriuretic Peptide: 293.2 pg/mL — ABNORMAL HIGH (ref 0.0–100.0)

## 2017-05-16 LAB — APTT: APTT: 110 s — AB (ref 24–36)

## 2017-05-16 SURGERY — LEFT HEART CATH AND CORONARY ANGIOGRAPHY
Anesthesia: LOCAL

## 2017-05-16 MED ORDER — FENTANYL CITRATE (PF) 100 MCG/2ML IJ SOLN: 100.0000 ug | Freq: Once | INTRAMUSCULAR | Status: DC

## 2017-05-16 MED ORDER — TIROFIBAN HCL IN NACL 5-0.9 MG/100ML-% IV SOLN
INTRAVENOUS | Status: AC
  Filled 2017-05-16: qty 100

## 2017-05-16 MED ORDER — NOREPINEPHRINE BITARTRATE 1 MG/ML IV SOLN
0.0000 ug/min | INTRAVENOUS | Status: DC
  Administered 2017-05-16: 30 ug/min via INTRAVENOUS
  Administered 2017-05-16: 100 ug/min via INTRAVENOUS
  Filled 2017-05-16 (×3): qty 16

## 2017-05-16 MED ORDER — TICAGRELOR 90 MG PO TABS
180.0000 mg | ORAL_TABLET | Freq: Once | ORAL | Status: DC
  Administered 2017-05-16: 180 mg

## 2017-05-16 MED ORDER — ONDANSETRON HCL 4 MG/2ML IJ SOLN: 4.0000 mg | Freq: Four times a day (QID) | INTRAMUSCULAR | Status: DC | PRN

## 2017-05-16 MED ORDER — SODIUM BICARBONATE 8.4 % IV SOLN
INTRAVENOUS | Status: DC | PRN
  Administered 2017-05-16 (×3): 50 meq via INTRAVENOUS

## 2017-05-16 MED ORDER — IOPAMIDOL (ISOVUE-370) INJECTION 76%
INTRAVENOUS | Status: AC
  Filled 2017-05-16: qty 100

## 2017-05-16 MED ORDER — FENTANYL CITRATE (PF) 100 MCG/2ML IJ SOLN: 100.0000 ug | INTRAMUSCULAR | Status: DC | PRN

## 2017-05-16 MED ORDER — ASPIRIN EC 81 MG PO TBEC: 81.0000 mg | DELAYED_RELEASE_TABLET | Freq: Every day | ORAL | Status: DC

## 2017-05-16 MED ORDER — BIVALIRUDIN TRIFLUOROACETATE 250 MG IV SOLR
INTRAVENOUS | Status: AC
  Filled 2017-05-16: qty 250

## 2017-05-16 MED ORDER — NITROGLYCERIN 1 MG/10 ML FOR IR/CATH LAB
INTRA_ARTERIAL | Status: DC | PRN
  Administered 2017-05-16: 100 ug via INTRACORONARY
  Administered 2017-05-16: 200 ug via INTRACORONARY

## 2017-05-16 MED ORDER — HEPARIN BOLUS VIA INFUSION
4000.0000 [IU] | Freq: Once | INTRAVENOUS | Status: DC
  Filled 2017-05-16: qty 4000

## 2017-05-16 MED ORDER — PROPOFOL 1000 MG/100ML IV EMUL
25.0000 ug/kg/min | INTRAVENOUS | Status: DC
  Administered 2017-05-16: 25 ug/kg/min via INTRAVENOUS
  Filled 2017-05-16: qty 100

## 2017-05-16 MED ORDER — ALBUMIN HUMAN 5 % IV SOLN
INTRAVENOUS | Status: AC
  Administered 2017-05-16: 12.5 g
  Filled 2017-05-16: qty 500

## 2017-05-16 MED ORDER — ASPIRIN 300 MG RE SUPP: 300.0000 mg | RECTAL | Status: DC

## 2017-05-16 MED ORDER — HEPARIN (PORCINE) IN NACL 2-0.9 UNIT/ML-% IJ SOLN
INTRAMUSCULAR | Status: AC | PRN
  Administered 2017-05-16: 500 mL
  Administered 2017-05-16: 1000 mL

## 2017-05-16 MED ORDER — DOPAMINE-DEXTROSE 3.2-5 MG/ML-% IV SOLN
0.0000 ug/kg/min | INTRAVENOUS | Status: DC
  Administered 2017-05-16: 10 ug/kg/min via INTRAVENOUS

## 2017-05-16 MED ORDER — LIDOCAINE HCL (PF) 1 % IJ SOLN
INTRAMUSCULAR | Status: AC
  Filled 2017-05-16: qty 30

## 2017-05-16 MED ORDER — TICAGRELOR 90 MG PO TABS: 180.0000 mg | ORAL_TABLET | Freq: Once | ORAL | Status: DC

## 2017-05-16 MED ORDER — HEPARIN (PORCINE) IN NACL 2-0.9 UNIT/ML-% IJ SOLN
INTRAMUSCULAR | Status: AC
  Filled 2017-05-16: qty 1000

## 2017-05-16 MED ORDER — EPINEPHRINE PF 1 MG/ML IJ SOLN
0.5000 ug/min | INTRAVENOUS | Status: DC
  Administered 2017-05-16: 30 ug/min via INTRAVENOUS
  Administered 2017-05-16: 20 ug/min via INTRAVENOUS
  Filled 2017-05-16 (×4): qty 4

## 2017-05-16 MED ORDER — HEPARIN (PORCINE) IN NACL 2-0.9 UNIT/ML-% IJ SOLN
INTRAMUSCULAR | Status: AC
  Filled 2017-05-16: qty 500

## 2017-05-16 MED ORDER — SODIUM CHLORIDE 0.9 % IV SOLN
0.0000 mg/h | INTRAVENOUS | Status: DC
  Administered 2017-05-16: 2 mg/h via INTRAVENOUS
  Filled 2017-05-16: qty 10

## 2017-05-16 MED ORDER — NITROGLYCERIN 0.4 MG SL SUBL
0.4000 mg | SUBLINGUAL_TABLET | SUBLINGUAL | Status: DC | PRN
Start: 2017-05-16 — End: 2017-05-17

## 2017-05-16 MED ORDER — LIDOCAINE-EPINEPHRINE 1 %-1:100000 IJ SOLN
INTRAMUSCULAR | Status: AC
  Filled 2017-05-16: qty 1

## 2017-05-16 MED ORDER — MIDAZOLAM HCL 2 MG/2ML IJ SOLN
2.0000 mg | INTRAMUSCULAR | Status: DC | PRN
  Administered 2017-05-16: 4 mg via INTRAVENOUS
  Filled 2017-05-16: qty 2

## 2017-05-16 MED ORDER — SODIUM CHLORIDE 0.9 % IV SOLN
INTRAVENOUS | Status: DC
  Administered 2017-05-16: 19:00:00 via INTRAVENOUS

## 2017-05-16 MED ORDER — SODIUM CHLORIDE 0.9% FLUSH: 3.0000 mL | Freq: Two times a day (BID) | INTRAVENOUS | Status: DC

## 2017-05-16 MED ORDER — SODIUM CHLORIDE 0.9% FLUSH: 3.0000 mL | INTRAVENOUS | Status: DC | PRN

## 2017-05-16 MED ORDER — DEXTROSE 5 % IV SOLN
0.0000 ug/min | INTRAVENOUS | Status: DC
  Filled 2017-05-16: qty 4

## 2017-05-16 MED ORDER — CANGRELOR TETRASODIUM 50 MG IV SOLR
INTRAVENOUS | Status: AC
  Filled 2017-05-16: qty 50

## 2017-05-16 MED ORDER — HYDRALAZINE HCL 20 MG/ML IJ SOLN: 5.0000 mg | INTRAMUSCULAR | Status: AC | PRN

## 2017-05-16 MED ORDER — TIROFIBAN HCL IN NACL 5-0.9 MG/100ML-% IV SOLN
0.1500 ug/kg/min | INTRAVENOUS | Status: DC
  Administered 2017-05-16: 0.15 ug/kg/min via INTRAVENOUS
  Filled 2017-05-16: qty 100

## 2017-05-16 MED ORDER — IOPAMIDOL (ISOVUE-370) INJECTION 76%
INTRAVENOUS | Status: AC
  Filled 2017-05-16: qty 125

## 2017-05-16 MED ORDER — ASPIRIN 81 MG PO CHEW: 81.0000 mg | CHEWABLE_TABLET | Freq: Every day | ORAL | Status: DC

## 2017-05-16 MED ORDER — TICAGRELOR 90 MG PO TABS
180.0000 mg | ORAL_TABLET | Freq: Once | ORAL | Status: DC
  Filled 2017-05-16: qty 2

## 2017-05-16 MED ORDER — MIDAZOLAM HCL 2 MG/2ML IJ SOLN
2.0000 mg | INTRAMUSCULAR | Status: DC | PRN
  Filled 2017-05-16: qty 2

## 2017-05-16 MED ORDER — IOPAMIDOL (ISOVUE-370) INJECTION 76%
INTRAVENOUS | Status: DC | PRN
  Administered 2017-05-16: 175 mL via INTRA_ARTERIAL

## 2017-05-16 MED ORDER — PANTOPRAZOLE SODIUM 40 MG IV SOLR: 40.0000 mg | Freq: Every day | INTRAVENOUS | Status: DC

## 2017-05-16 MED ORDER — CANGRELOR BOLUS VIA INFUSION
INTRAVENOUS | Status: DC | PRN
  Administered 2017-05-16: 4110 ug via INTRAVENOUS

## 2017-05-16 MED ORDER — NOREPINEPHRINE BITARTRATE 1 MG/ML IV SOLN
INTRAVENOUS | Status: AC | PRN
  Administered 2017-05-16: 10 ug/kg/min via INTRAVENOUS

## 2017-05-16 MED ORDER — SODIUM BICARBONATE 8.4 % IV SOLN
INTRAVENOUS | Status: AC
  Administered 2017-05-16: 50 meq via INTRAVENOUS
  Filled 2017-05-16: qty 50

## 2017-05-16 MED ORDER — CISATRACURIUM BOLUS VIA INFUSION
0.0500 mg/kg | INTRAVENOUS | Status: DC | PRN
  Filled 2017-05-16: qty 7

## 2017-05-16 MED ORDER — CISATRACURIUM BOLUS VIA INFUSION
0.1000 mg/kg | Freq: Once | INTRAVENOUS | Status: DC
  Filled 2017-05-16: qty 14

## 2017-05-16 MED ORDER — ACETAMINOPHEN 325 MG PO TABS: 650.0000 mg | ORAL_TABLET | ORAL | Status: DC | PRN

## 2017-05-16 MED ORDER — EMPTY CONTAINERS FLEXIBLE MISC
4879.0000 [IU] | Status: AC
  Administered 2017-05-16: 4879 [IU] via INTRAVENOUS
  Filled 2017-05-16: qty 4379

## 2017-05-16 MED ORDER — TICAGRELOR 90 MG PO TABS: 90.0000 mg | ORAL_TABLET | Freq: Two times a day (BID) | ORAL | Status: DC

## 2017-05-16 MED ORDER — BIVALIRUDIN TRIFLUOROACETATE 250 MG IV SOLR
INTRAVENOUS | Status: AC | PRN
  Administered 2017-05-16 (×3): 1.75 mg/kg/h via INTRAVENOUS

## 2017-05-16 MED ORDER — LIDOCAINE-EPINEPHRINE 1 %-1:100000 IJ SOLN
INTRAMUSCULAR | Status: DC | PRN
  Administered 2017-05-16: 5 mL

## 2017-05-16 MED ORDER — SODIUM BICARBONATE 8.4 % IV SOLN
50.0000 meq | Freq: Once | INTRAVENOUS | Status: AC
  Administered 2017-05-16: 50 meq via INTRAVENOUS

## 2017-05-16 MED ORDER — INSULIN ASPART 100 UNIT/ML ~~LOC~~ SOLN: 2.0000 [IU] | SUBCUTANEOUS | Status: DC

## 2017-05-16 MED ORDER — NITROGLYCERIN 1 MG/10 ML FOR IR/CATH LAB
INTRA_ARTERIAL | Status: AC
  Filled 2017-05-16: qty 10

## 2017-05-16 MED ORDER — SODIUM BICARBONATE 8.4 % IV SOLN
INTRAVENOUS | Status: AC
  Filled 2017-05-16: qty 50

## 2017-05-16 MED ORDER — HEPARIN (PORCINE) IN NACL 100-0.45 UNIT/ML-% IJ SOLN
1300.0000 [IU]/h | INTRAMUSCULAR | Status: DC
  Filled 2017-05-16: qty 250

## 2017-05-16 MED ORDER — SODIUM CHLORIDE 0.9 % IV SOLN: INTRAVENOUS | Status: DC

## 2017-05-16 MED ORDER — SODIUM CHLORIDE 0.9 % IV SOLN
100.0000 ug/h | INTRAVENOUS | Status: DC
  Administered 2017-05-16: 100 ug/h via INTRAVENOUS
  Filled 2017-05-16: qty 50

## 2017-05-16 MED ORDER — SODIUM CHLORIDE 0.9 % IV SOLN
1.7500 mg/kg/h | INTRAVENOUS | Status: AC
  Administered 2017-05-16: 1.75 mg/kg/h via INTRAVENOUS
  Filled 2017-05-16: qty 250

## 2017-05-16 MED ORDER — LIDOCAINE HCL (PF) 1 % IJ SOLN
INTRAMUSCULAR | Status: DC | PRN
  Administered 2017-05-16 (×2): 10 mL

## 2017-05-16 MED ORDER — SODIUM CHLORIDE 0.9 % IV SOLN: 250.0000 mL | INTRAVENOUS | Status: DC | PRN

## 2017-05-16 MED ORDER — TIROFIBAN (AGGRASTAT) BOLUS VIA INFUSION: INTRAVENOUS | Status: DC | PRN

## 2017-05-16 MED ORDER — SODIUM CHLORIDE 0.9 % IV SOLN: Freq: Once | INTRAVENOUS | Status: DC

## 2017-05-16 MED ORDER — SODIUM BICARBONATE 8.4 % IV SOLN
INTRAVENOUS | Status: AC
  Filled 2017-05-16: qty 100

## 2017-05-16 MED ORDER — CISATRACURIUM BESYLATE (PF) 200 MG/20ML IV SOLN
1.0000 ug/kg/min | INTRAVENOUS | Status: DC
  Administered 2017-05-16: 1 ug/kg/min via INTRAVENOUS
  Filled 2017-05-16: qty 20

## 2017-05-16 MED ORDER — SODIUM CHLORIDE 0.9 % IV SOLN
INTRAVENOUS | Status: AC | PRN
  Administered 2017-05-16 (×2): 4 ug/kg/min via INTRAVENOUS

## 2017-05-16 MED ORDER — CALCIUM CHLORIDE 10 % IV SOLN
INTRAVENOUS | Status: AC
  Filled 2017-05-16: qty 20

## 2017-05-16 MED ORDER — BIVALIRUDIN BOLUS VIA INFUSION - CUPID
INTRAVENOUS | Status: DC | PRN
  Administered 2017-05-16: 102.75 mg via INTRAVENOUS

## 2017-05-16 MED ORDER — LABETALOL HCL 5 MG/ML IV SOLN: 10.0000 mg | INTRAVENOUS | Status: AC | PRN

## 2017-05-16 MED ORDER — ARTIFICIAL TEARS OPHTHALMIC OINT: 1.0000 "application " | TOPICAL_OINTMENT | Freq: Three times a day (TID) | OPHTHALMIC | Status: DC

## 2017-05-16 MED ORDER — FENTANYL BOLUS VIA INFUSION
50.0000 ug | INTRAVENOUS | Status: DC | PRN
  Administered 2017-05-16: 50 ug via INTRAVENOUS
  Filled 2017-05-16: qty 50

## 2017-05-16 MED ORDER — SODIUM BICARBONATE 8.4 % IV SOLN
INTRAVENOUS | Status: DC
  Administered 2017-05-16: via INTRAVENOUS
  Filled 2017-05-16 (×2): qty 150

## 2017-05-16 MED ORDER — TIROFIBAN HCL IV 12.5 MG/250 ML
INTRAVENOUS | Status: AC | PRN
Start: 2017-05-16 — End: 2017-05-16
  Administered 2017-05-16 (×2): .15 ug/kg/min via INTRAVENOUS

## 2017-05-16 MED ORDER — SODIUM CHLORIDE 0.9 % IV BOLUS (SEPSIS)
500.0000 mL | Freq: Once | INTRAVENOUS | Status: AC
  Administered 2017-05-16 (×2): 500 mL via INTRAVENOUS
  Administered 2017-05-16: 200 mL via INTRAVENOUS

## 2017-05-16 MED ORDER — SODIUM CHLORIDE 0.9 % IV SOLN
INTRAVENOUS | Status: DC
  Filled 2017-05-16: qty 1

## 2017-05-16 SURGICAL SUPPLY — 20 items
BALLN EMERGE MR 2.5X15 (BALLOONS) ×2
BALLN SAPPHIRE ~~LOC~~ 3.25X15 (BALLOONS) ×2 IMPLANT
BALLOON EMERGE MR 2.5X15 (BALLOONS) ×1 IMPLANT
CATH EXTRAC PRONTO 5.5F 138CM (CATHETERS) ×2 IMPLANT
CATH INFINITI 5FR MULTPACK ANG (CATHETERS) ×2 IMPLANT
CATH S G BIP PACING (SET/KITS/TRAYS/PACK) ×2 IMPLANT
CATH VISTA GUIDE 6FR XB3.5 (CATHETERS) ×2 IMPLANT
HEMOSTASIS PAD V PLUS (HEMOSTASIS) ×2 IMPLANT
KIT ENCORE 26 ADVANTAGE (KITS) ×2 IMPLANT
KIT HEART LEFT (KITS) ×2 IMPLANT
PACK CARDIAC CATHETERIZATION (CUSTOM PROCEDURE TRAY) ×2 IMPLANT
SHEATH PINNACLE 6F 10CM (SHEATH) ×4 IMPLANT
SLEEVE REPOSITIONING LENGTH 30 (MISCELLANEOUS) ×2 IMPLANT
STENT SYNERGY DES 3X20 (Permanent Stent) ×2 IMPLANT
TRANSDUCER W/STOPCOCK (MISCELLANEOUS) ×2 IMPLANT
TUBING CIL FLEX 10 FLL-RA (TUBING) ×2 IMPLANT
WIRE EMERALD 3MM-J .035X150CM (WIRE) ×4 IMPLANT
WIRE HI TORQ VERSACORE-J 145CM (WIRE) ×2 IMPLANT
WIRE MARVEL STR TIP 190CM (WIRE) ×2 IMPLANT
WIRE PT2 MS 185 (WIRE) ×2 IMPLANT

## 2017-05-16 NOTE — Progress Notes (Signed)
A Code was called for this patient. The nurse asked me to offer support to patient's family in waiting room..  The husband and daughter were allowed back to outside the room. The doctor gave them an update on patient condition. They whwn back out to waiting area where they shared with family and friends the news.  There minister was present he offered prayer.

## 2017-05-16 NOTE — ED Notes (Signed)
[facility] notified @ 1403 to activate CODE STEMI

## 2017-05-16 NOTE — ED Notes (Addendum)
Cath lab team at bedside.

## 2017-05-16 NOTE — ED Notes (Signed)
Chaplain paged due to pt's status and family in the consultation room.

## 2017-05-16 NOTE — ED Notes (Signed)
Dr.Long made aware of ptsa Lactic Acid.

## 2017-05-16 NOTE — ED Notes (Signed)
MD activating Code STEMI.

## 2017-05-16 NOTE — Progress Notes (Signed)
eLink Physician-Brief Progress Note Patient Name: Dorthula NettlesCynthia D Wheat DOB: [birth year] MRN: [id number]   Date of Service  10/14/16  HPI/Events of Note  S/p cardiac arrest earlier this pm, persistent cardiogenic shock.  Now with severe acidosis.   eICU Interventions  Start bicarb drip. Continue current measures.  Family made DNR, they are trying to bring son in who is currently in the [employer] [place].         Shane Crutchradeep Iyla Balzarini 10/14/16, 11:16 PM

## 2017-05-16 NOTE — Progress Notes (Signed)
PCCM Interval Progress Note  Head CT completed - no hemorrhage or other acute process.  Cath completed - 100% stenosed prox to mid Cx lesion, 85% stenosed prox to mid LAD lesion, 30% stenosed prox and mid RCA lesion, 30% stenosed post atrio lesion, 100% stenosis to dist Cx.  DES placed with 0% residual stenosis. Temporary pacemaker placed due to junctional bradycardia.  Levophed continued due to cardiogenic shock. She will require staged PCI to the LAD.   Will proceed with TTM, goal 30 degrees.  Orders placed and info relayed to RN staff.   [name], [place] - [facility] Pager: [phone]  or [phone] 08-13-2016, 5:54 PM

## 2017-05-16 NOTE — Progress Notes (Signed)
Interventional cardiology Note:  I came to assess patient after PEA arrest at 9:32 pm after discussion with the Cardiology fellow coverage. Events since admission reviewed. Cardiac cath films reviewed.   Patient presented with out of hospital cardiac arrest. ? Initially asystole then Vfib requiring defibrillator x 2. Brought to cath lab and found to have thrombotic occlusion of the LCx. Moderate to severe LAD disease. The LCx was stented with a DES. Initially treated with IV Cangrelor which was discontinued after Brilinta added. Also on Aggrastat. She had persistent cardiogenic shock requiring pressors. She also had a junctional bradycardia requiring temporary transvenous pacemaker.   Earlier in the evening was noted to have uncontrolled bleeding in right groin. IV Aggrastat was discontinued. With continued uncontrolled bleeding right femoral arterial sheath removed and fem stop placed. CCM estimated 4-5 unit bleeding.   At 9:30 pm patient had a PEA arrest. Responded to IV pressors and CPR. Temp pacer was not capturing.  Currently patient has continued bleeding from Right groin. Manual pressure applied. She is morbidly obese on vent. BP 70 systolic on maximal pressors including epinephrine, Levophed and dopamine. She is in sinus rhythm rate in 60s.  Laboratory data significant for INR 5.5, troponin >65, lactic acid 3. Hgb 15.7>>10.3. WBC 21K.   I reviewed situation with CCM and with the family. She has persistent cardiogenic shock unresponsive to coronary reperfusion and escalating pressor therapy. She has difficult to control bleeding from right groin cath site. Consideration was given for additional hemodynamic support including IABP or Impella. I feel that this is not feasible with her morbid obesity, limited access and uncontrolled bleeding from right groin. I think her chance of meaningful functional recovery is extremely poor. After discussion with family they desired to make her no CPR. Will  continue medical support with pressors, correction of coagulopathy, transfusion and ventilator support.   Patient seen and examined and history reviewed. Agree with above findings and plan. Time for critical care evaluation and discussion with family and care team one hour.  Nichols Corter [place], MDFACC 05/05/2017 11:04 PM     Purity Irmen Swazil[place]Jordan MD, [facility] HospitalFACC    05/20/2017 11:04 PM

## 2017-05-16 NOTE — ED Triage Notes (Signed)
[facility] from home following witnessed arrest. Pt's daughter began CPR until EMS arrival - asystole on monitor, given 5 epinephrine, 300mg  amiodarone, 2mg  Narcan, and 1L NS PTA. Defib x 3 PTA, EMS got ROSC with junctional brady on monitor. Pt intubated with some spontaneous breathing. Per EMS, pt has been out of work d/t diabetes complications.

## 2017-05-16 NOTE — Progress Notes (Signed)
Responded to ED page to support patient who came in as CPR and later Cardiac arrest/stemi. Accompanied EDP to brief patient husband and two daughters on patients status. Escorted family th 2H waiting area . Patient going for emergency Cath. Chaplain will follow as needed.     1428  Clinical Encounter Type  Visited With Patient;Family;Health care provider  Visit Type Initial;Spiritual support;ED  Referral From Nurse  Spiritual Encounters  Spiritual Needs Emotional  Stress Factors  Family Stress Factors Health changes  Fae PippinWatlington, Deon Duer, Chaplain, [facility], Pager [phone]

## 2017-05-16 NOTE — H&P (Signed)
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   History of Present Illness:   [name] is a 61 y.o. female, works as an Charity fundraiser in the ICU at [employer], with a history of IDDM with neuropathy and chronic pain, morbid obesity, HTN, and untreated HLD (statin intolerance). She had a Myoview in 2014 that was low risk. She saw Dr Diona Browner in May 2018 for dyspnea. Echo done then showed normal LVF with mild LVH but no diastolic dysfunction. She did not complain of chest pain then so ischemic work up was not pursued. She just saw her PCP earlier in Dec and he notes her DM was under good control. Her main issues have been chronic pain from neuropathy and DJD. She also felt like she was having some cognitive decline. He suggested she apply for long term disability.  Today at work she was on the phone and collapsed. There is no history of chest pain, SOB, stroke, or seizure. EMS shocked her at least twice to junctional rhythm with diffuse ST depression and ST elevation in AVR suggesting an acute MI. She was intubated abd transported to [facility]. Time to ROSC was estimated at 20 minutes. When seen in the ED she was intubated and unresponsive. EKG showed NSR/SB at 56. B/P labile- 148/100- 88/68.    Past Medical History:  Diagnosis Date  . Anxiety   . Bilateral carotid artery stenosis   . Degenerative joint disease   . Depression   . 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 Essential hypertension   . Fibromyalgia   . History of atrial fibrillation   . History of uterine cancer   .  Hyperlipidemia   . Intracranial vascular stenosis 04/26/2015   Noted by brain MRI  . Neuropathy     Past Surgical History:  Procedure Laterality Date  . ABDOMINAL HYSTERECTOMY  2000  . AMPUTATION  04/11/2011   Procedure: AMPUTATION DIGIT;  Surgeon: Dallas Schimke;  Location: [facility];  Service: Orthopedics;  Laterality: Left;  Amputation Second Toe Left Foot  . AMPUTATION Right 03/10/2016   Procedure: PARTIAL AMPUTATION 5TH RAY RIGHT FOOT;  Surgeon: Ferman Hamming, DPM;  Location: [facility];  Service: Podiatry;  Laterality: Right;  . BONE BIOPSY  04/11/2011   Procedure: BONE BIOPSY;  Surgeon: Dallas Schimke;  Location: [facility];  Service: Orthopedics;  Laterality: Left;  Bone Biopsy Left Foot Third Toe   . CARPAL TUNNEL RELEASE  2003   bilateral   . CHOLECYSTECTOMY  2007  . COLONOSCOPY    . HERNIA REPAIR  2007   incisional hernia repair x2  . KNEE ARTHROSCOPY     left knee-multiple  . KNEE ARTHROSCOPY  2000   right knee  . REPLACEMENT TOTAL KNEE BILATERAL     2006  . THYROID CYST EXCISION  1996   removal of nodule  . TOE AMPUTATION  November 2012  . TUBAL LIGATION  1978     Medications Prior to Admission: Prior to Admission medications   Medication Sig Start Date End Date Taking? Authorizing Provider  aspirin 81 MG  chewable tablet Chew 1 tablet (81 mg total) by mouth daily. 02/03/13   Standley Brooking, MD  Cyanocobalamin (VITAMIN B 12 PO) Take 5,000 Units by mouth daily.    [provider]  diltiazem (CARDIZEM) 120 MG tablet Take 1 tablet (120 mg total) by mouth daily. 05/31/16   Babs Sciara, MD  empagliflozin (JARDIANCE) 25 MG TABS tablet Take 25 mg by mouth daily. Patient not taking: Reported on 05/14/2017 10/10/16   Babs Sciara, MD  ezetimibe (ZETIA) 10 MG tablet Take 1 tablet (10 mg total) by mouth daily. Patient not taking: Reported on 05/14/2017 10/10/16   Babs Sciara, MD  furosemide (LASIX) 20 MG tablet Take 1 tablet (20 mg total) by  mouth daily. 10/10/16   Babs Sciara, MD  glucose blood test strip Use as instructed 04/24/17   Luking, Jonna Coup, MD  HUMALOG KWIKPEN 100 UNIT/ML KiwkPen INJECT 20-28 UNITS 3 TIMES DAILY WITH MEALS Patient taking differently: INJECT 20-28 UNITS 3 TIMES DAILY WITH MEALS pt states she is taking 10-20 units Three times daily. 03/27/17   Babs Sciara, MD  HYDROcodone-acetaminophen (NORCO/VICODIN) 5-325 MG tablet Take 1 tablet 4 (four) times daily as needed by mouth. 04/09/17   Luking, Jonna Coup, MD  ibuprofen (ADVIL,MOTRIN) 800 MG tablet Take 1 tablet (800 mg total) by mouth daily. Patient taking differently: Take 800 mg by mouth 2 (two) times daily as needed for moderate pain.  02/03/13   Standley Brooking, MD  Insulin Glargine (LANTUS SOLOSTAR) 100 UNIT/ML Solostar Pen 30 UNITS TWO TIMES A DAY. MAY TITRATE UP TO 80 UNITS NIGHTLY Patient taking differently: 30 UNITS TWO TIMES A DAY. MAY TITRATE UP TO 80 UNITS NIGHTLY Pt states she takes 20 units BID. 05/31/16   Luking, Jonna Coup, MD  lisinopril (PRINIVIL,ZESTRIL) 5 MG tablet Take 0.5 tablets (2.5 mg total) by mouth daily. 05/31/16   Babs Sciara, MD  modafinil (PROVIGIL) 200 MG tablet Take 1 tablet (200 mg total) by mouth daily. Patient not taking: Reported on 05/14/2017 10/10/16   Babs Sciara, MD  Multiple Vitamin (MULTIVITAMIN) tablet Take 1 tablet by mouth daily.    [provider]  PARoxetine (PAXIL) 20 MG tablet TAKE 4 TABLETS BY MOUTH EVERY MORNING Patient not taking: Reported on 05/14/2017 05/03/16   Babs Sciara, MD  pregabalin (LYRICA) 200 MG capsule Take 1 capsule (200 mg total) by mouth 2 (two) times daily. 01/23/17   Babs Sciara, MD     Allergies:    Allergies  Allergen Reactions  . Chantix [Varenicline Tartrate] Other (See Comments)    psychotic episodes  . Codeine Nausea Only  . Statins Other (See Comments)    Muscle weakness    Social History:   Social History   Socioeconomic History  . Marital status:  Married    Spouse name: Not on file  . Number of children: 5  . Years of education: assoc. nur  . Highest education level: Not on file  Social Needs  . Financial resource strain: Not on file  . Food insecurity - worry: Not on file  . Food insecurity - inability: Not on file  . Transportation needs - medical: Not on file  . Transportation needs - non-medical: Not on file  Occupational History  . Occupation: [employer]  Tobacco Use  . Smoking status: Current Every Day Smoker    Packs/day: 0.50    Years: 40.00    Pack years: 20.00  Types: Cigarettes    Last attempt to quit: 02/27/2016    Years since quitting: 1.2  . Smokeless tobacco: Never Used  Substance and Sexual Activity  . Alcohol use: No  . Drug use: No  . Sexual activity: Yes    Birth control/protection: Surgical  Other Topics Concern  . Not on file  Social History Narrative   Patient drinks about 4 cups of caffeine daily.   Patient is left handed.     Family History:   The patient's family history includes Alzheimer's disease in her maternal aunt; Arthritis in her brother; Atrial fibrillation in her brother and father; Bipolar disorder in her daughter and father; Brain cancer in her father; Brain cancer (age of onset: 14) in her other; Breast cancer in her other and other; Cancer in her mother; Diabetes in her brother, father, paternal aunt, and paternal grandfather; Fibroids in her mother; Fibromyalgia in her mother and sister; Heart Problems in her maternal grandfather and paternal grandfather; Heart attack in her brother; Heart disease in her father; High blood pressure in her maternal grandfather; Liver cancer in her paternal grandmother; Lupus in her cousin; Osteoarthritis in her mother; Other in her brother and mother; Renal cancer in her paternal grandfather; Skin cancer in her mother; Stroke in her brother, maternal grandmother, and sister; Thyroid disease in her maternal aunt, maternal aunt, mother, and  paternal uncle. There is no history of Anesthesia problems, Hypotension, Malignant hyperthermia, or Pseudochol deficiency.    ROS:  Please see the history of present illness.  All other ROS reviewed and negative.     Physical Exam/Data:   Vitals:   May 17, 2017 1400  1403  1410 May 17, 2017 1415  BP: (!) 73/36 96/81  (!) 89/69  Pulse:  (!) 55 (!) 55 (!) 52  Resp: (!) 26 13 (!) 26 (!) 35  Temp:      TempSrc:      SpO2:  100% 100% 100%   No intake or output data in the 24 hours ending May 17, 2017 1438 There were no vitals filed for this visit. There is no height or weight on file to calculate BMI.  General:  Morbidly obese female, intubated, unresponsive HEENT: intubated Neck: no obvious JVD Endocrine:  No thryomegaly Vascular: No carotid bruits; FA pulses 2+ bilaterally without bruits  Cardiac:  normal S1, S2; RRR; no murmur  Lungs:  clear to auscultation bilaterally, no wheezing, rhonchi or rales  Abd: obese, soft, nontender, large ventral hernia Ext: no edema, s/p Ray with dressings on both feet Musculoskeletal:  No deformities, BUE and BLE strength normal and equal Skin: warm and dry  Neuro:  CNs 2-12 intact, no focal abnormalities noted Psych:  Normal affect    EKG:  The ECG that was done  was personally reviewed and demonstrates NSR, SB with diffuse ST depression and ST elavation in AVR  Relevant CV Studies: Echo- 10/12/16- Impressions:  - Mild LVH with LVEF 60-65% and grossly normal diastolic function   for age. Trivial mitral regurgitation. Mildly calcified aortic   annulus. Trivial tricuspid regurgitation.   Laboratory Data:  ChemistryNo results for input(s): NA, K, CL, CO2, GLUCOSE, BUN, CREATININE, CALCIUM, GFRNONAA, GFRAA, ANIONGAP in the last 168 hours.  No results for input(s): PROT, ALBUMIN, AST, ALT, ALKPHOS, BILITOT in the last 168 hours. Hematology Recent Labs  Lab  1401  WBC 23.3*  RBC 4.70  HGB 15.7*  HCT 46.1*  MCV 98.1  MCH  33.4  MCHC 34.1  RDW 13.2  PLT 180  Cardiac EnzymesNo results for input(s): TROPONINI in the last 168 hours.  Recent Labs  Lab 05/25/2017 1414  TROPIPOC 7.20*    BNPNo results for input(s): BNP, PROBNP in the last 168 hours.  DDimer No results for input(s): DDIMER in the last 168 hours.  Radiology/Studies:  Dg Chest Portable 1 View  Result Date:  CLINICAL DATA:  Short of breath EXAM: PORTABLE CHEST 1 VIEW COMPARISON:  09/20/2016 FINDINGS: Endotracheal tube in good position. Cardiac enlargement with mild vascular congestion. Negative for edema. Negative for pneumonia or effusion. IMPRESSION: Cardiac enlargement with mild vascular congestion. Negative for edema or pneumonia. Endotracheal tube in good position. Electronically Signed   By: Marlan Palauharles  Clark M.D.   On: 04/28/2017 14:14    Assessment and Plan:   Witnessed cardiac arrest- Pt collapsed while on the phone at work. 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 No recent chest pain. Echo done May 2018 showed normal LVF, no diastolic dysfunction.  IDDM- Recently under good control per PCP. She has chronic neuropathic pain and Charcot foot, s/p prior Ray procedure.   PVD- Moderate carotid disease with bilateral 50-69% ICA stenosis by doppler May 2018.  HLD- Statin intol- her last LDL was 144-Jan 2108  H/O depression- She sees a psychiatrist on a regular basis per Dr Gerda DissLuking  Morbid obesity- BMI 45  DJD- Knees and hips. Dr Despina HickAlusio   Plan: Urgent Cath.    Severity of Illness: The appropriate patient status for this patient is INPATIENT. Inpatient status is judged to be reasonable and necessary in order to provide the required intensity of service to ensure the patient's safety. The patient's presenting symptoms, physical exam findings, and initial radiographic and laboratory data in the context of their chronic comorbidities is felt to place them at high risk for further clinical deterioration. Furthermore, it is not anticipated that  the patient will be medically stable for discharge from the hospital within 2 midnights of admission. The following factors support the patient status of inpatient.   " The patient's presenting symptoms include cardiac arrest. " The worrisome physical exam findings include respiratory failure. " The initial radiographic and laboratory data are worrisome because of STEMI. " The chronic co-morbidities include diabetes.   * I certify that at the point of admission it is my clinical judgment that the patient will require inpatient hospital care spanning beyond 2 midnights from the point of admission due to high intensity of service, high risk for further deterioration and high frequency of surveillance required.*    For questions or updates, please contact [facility] Please consult [email or web address] for contact info under Cardiology/STEMI.    Jolene ProvostSigned, Luke Kilroy, PA-C  05/12/2017 2:38 PM   Patient seen and examined. Agree with assessment and plan.  Patient was seen and examined in the emergency room.  She presented after suffering an out of hospital cardiac arrest which was witnessed, but family members.  CPR was administered prior to EMS arrival and apparently upon EMS arrival, she was asystolic.  She underwent several defibrillations for ventricular fibrillation.  She was intubated in the field.  [facility], she was unresponsive, sedated, and hypotensive with blood pressure consistent with cardiogenic shock.  Her ECG has shown ST elevation in aVR with diffuse ST depression anteriorly, suggesting global ischemia from possible left main disease or possible circumflex occlusion.  I discussed the situation with family members.  Plan emergent cardiac catheterization with possible intervention.  Will plan to initiate hypothermia and critical care  team was notified. Nicki Guadalajarahomas Edyn Popoca, MD  05/28/2017   Lennette Biharihomas A. Keimon Basaldua, MD, [facility] HospitalFACC 02/07/2017 8:57 PM

## 2017-05-16 NOTE — Progress Notes (Signed)
ANTICOAGULATION CONSULT NOTE - Initial Consult  Pharmacy Consult for heparin Indication: chest pain/ACS  Allergies  Allergen Reactions  . Chantix [Varenicline Tartrate] Other (See Comments)    psychotic episodes  . Codeine Nausea Only  . Statins Other (See Comments)    Muscle weakness    Patient Measurements:   Heparin Dosing Weight: 101kg   Vital Signs: Temp: 96.2 F (35.7 C) (12/19 1358) Temp Source: Temporal (12/19 1358) BP: 96/81 (12/19 1403) Pulse Rate: 55 (12/19 1403)  Labs: No results for input(s): HGB, HCT, PLT, APTT, LABPROT, INR, HEPARINUNFRC, HEPRLOWMOCWT, CREATININE, CKTOTAL, CKMB, TROPONINI in the last 72 hours.  CrCl cannot be calculated (Patient's most recent lab result is older than the maximum 21 days allowed.).   Medical History: Past Medical History:  Diagnosis Date  . Anxiety   . Bilateral carotid artery stenosis   . Degenerative joint disease   . Depression   . 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 Essential hypertension   . Fibromyalgia   . History of atrial fibrillation   . History of uterine cancer   . Hyperlipidemia   . Intracranial vascular stenosis 04/26/2015   Noted by brain MRI  . Neuropathy      Assessment: S/p CPR with rosc STEMI on way to cath lab Labs in process  Goal of Therapy:  Heparin level 0.3-0.7 units/ml Monitor platelets by anticoagulation protocol: Yes    Plan:  -heparin bolus 4000 units x1 then 1300 units/hr -daily HL, CBC -F/u after cath  Michele FridayMasters, [name] 09-16-16,2:13 PM

## 2017-05-16 NOTE — ED Provider Notes (Signed)
Emergency Department Provider Note   I have reviewed the triage vital signs and the nursing notes.   HISTORY  Chief Complaint Cardiac Arrest   HPI [name] is a 61 y.o. female DM, HTN, HLD, and obesity presents to the emergency department for evaluation after cardiac arrest.  Patient was at home when she said the family that she was getting a phone call.  She suddenly slumped over from her chair and became unresponsive.  Her husband called for help and a family member immediately started CPR.  EMS reports that on their arrival the patient was in asystole and ACLS was initiated.  She had 3 episodes of V. Fib and was defibrillated.  She received 5 rounds of epinephrine and 300 mg of amiodarone.  Patient also received IV fluid bolus through an intraosseous access established on scene.   Husband states that there was no warning signs.  Patient was not complaining of any symptoms prior to the event.   Level 5 caveat: Post-CPR and intubated.    Past Medical History:  Diagnosis Date  . Anxiety   . Bilateral carotid artery stenosis   . Degenerative joint disease   . Depression   . 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 Essential hypertension   . Fibromyalgia   . History of atrial fibrillation   . History of uterine cancer   . Hyperlipidemia   . Intracranial vascular stenosis 04/26/2015   Noted by brain MRI  . Neuropathy     Patient Active Problem List   Diagnosis Date Noted  . Genetic testing 05/14/2016  . History of endometrial cancer 03/31/2016  . Excessive sleepiness 04/29/2015  . Memory loss 04/29/2015  . Diabetic neuropathy, painful (HCC) 10/21/2013  . Morbid obesity (HCC) 04/19/2013  . Fibromyalgia 04/19/2013  . Ventral hernia 02/24/2013  . Carotid stenosis 02/24/2013  . Chest pain 02/03/2013  . Pneumonia 02/03/2013  . Charcot foot due to diabetes mellitus (HCC) 12/10/2012  . Type 2 diabetes mellitus not at goal [facility](HCC) 12/10/2012  .  Essential hypertension, benign 12/10/2012  . Hyperlipemia 12/10/2012    Past Surgical History:  Procedure Laterality Date  . ABDOMINAL HYSTERECTOMY  2000  . AMPUTATION  04/11/2011   Procedure: AMPUTATION DIGIT;  Surgeon: Dallas SchimkeBenjamin Ivan McKinney;  Location: [facility];  Service: Orthopedics;  Laterality: Left;  Amputation Second Toe Left Foot  . AMPUTATION Right 03/10/2016   Procedure: PARTIAL AMPUTATION 5TH RAY RIGHT FOOT;  Surgeon: Ferman HammingBenjamin McKinney, DPM;  Location: [facility];  Service: Podiatry;  Laterality: Right;  . BONE BIOPSY  04/11/2011   Procedure: BONE BIOPSY;  Surgeon: Dallas SchimkeBenjamin Ivan McKinney;  Location: [facility];  Service: Orthopedics;  Laterality: Left;  Bone Biopsy Left Foot Third Toe   . CARPAL TUNNEL RELEASE  2003   bilateral   . CHOLECYSTECTOMY  2007  . COLONOSCOPY    . HERNIA REPAIR  2007   incisional hernia repair x2  . KNEE ARTHROSCOPY     left knee-multiple  . KNEE ARTHROSCOPY  2000   right knee  . REPLACEMENT TOTAL KNEE BILATERAL     2006  . THYROID CYST EXCISION  1996   removal of nodule  . TOE AMPUTATION  November 2012  . TUBAL LIGATION  1978      Allergies Chantix [varenicline tartrate]; Codeine; and Statins  Family History  Problem Relation Age of Onset  . Brain cancer Father        glioblastoma dx. early 2660s; d. 73y  .  Atrial fibrillation Father   . Diabetes Father   . Bipolar disorder Father   . Heart disease Father   . Osteoarthritis Mother   . Fibromyalgia Mother   . Thyroid disease Mother   . Other Mother        +SDHC mutation  . Cancer Mother        adrenal gland cancer  . Skin cancer Mother        unspecified type  . Fibroids Mother        hx of hysterectomy  . Heart attack Brother        about age 650  . Stroke Brother   . Atrial fibrillation Brother   . Diabetes Brother   . Other Brother        tumor removed from his carotid artery, dx 52-54  . Stroke Sister   . Fibromyalgia Sister   . Thyroid disease Maternal Aunt   . Alzheimer's  disease Maternal Aunt        d. 10478  . Diabetes Paternal Aunt   . Thyroid disease Paternal Uncle   . Renal cancer Paternal Grandfather        dx. 85-86  . Diabetes Paternal Grandfather   . Heart Problems Paternal Grandfather        d. 6089  . Arthritis Brother   . Bipolar disorder Daughter   . Stroke Maternal Grandmother        d. 787  . High blood pressure Maternal Grandfather   . Heart Problems Maternal Grandfather   . Liver cancer Paternal Grandmother        dx. 5169; mets to lung, d. 69  . Thyroid disease Maternal Aunt   . Lupus Cousin        maternal 1st cousin  . Breast cancer Other        (x2) paternal great aunts (PGF's sisters)  . Breast cancer Other        paternal great grandmother (PGF's mother)  . Brain cancer Other 50       paternal great grandfather (PGF's father)  . Anesthesia problems Neg Hx   . Hypotension Neg Hx   . Malignant hyperthermia Neg Hx   . Pseudochol deficiency Neg Hx     Social History Social History   Tobacco Use  . Smoking status: Current Every Day Smoker    Packs/day: 0.50    Years: 40.00    Pack years: 20.00    Types: Cigarettes    Last attempt to quit: 02/27/2016    Years since quitting: 1.2  . Smokeless tobacco: Never Used  Substance Use Topics  . Alcohol use: No  . Drug use: No    Review of Systems  Level 5 caveat: post-CPR and intubated.   ____________________________________________   PHYSICAL EXAM:  VITAL SIGNS: ED Triage Vitals [05-19-17 1358]  Enc Vitals Group     BP (!) 148/131     Pulse Rate (!) 55     Resp (!) 25     Temp (!) 96.2 F (35.7 C)     Temp Source Temporal     SpO2 100 %    Constitutional: Intubated but breathing over the vent on arrival. No purposeful movements with extremities.  Eyes: Conjunctivae are normal.  Head: Atraumatic. Nose: No congestion/rhinnorhea. Mouth/Throat: Mucous membranes are moist. ETT in place with good end-tidal CO2.  Cardiovascular: Junctional rhythm on monitor. Cool  extremities to touch. Grossly normal heart sounds.   Respiratory: Ventilated but breathing over the  vent. Breath sounds are CTABL.  Gastrointestinal: Soft. No clear distension.  Musculoskeletal: No gross deformities of extremities. Neurologic: GCS 3.  Skin:  Skin is cool, dry and intact.  ____________________________________________   LABS (all labs ordered are listed, but only abnormal results are displayed)  Labs Reviewed  CBC WITH DIFFERENTIAL/PLATELET - Abnormal; Notable for the following components:      Result Value   WBC 23.3 (*)    Hemoglobin 15.7 (*)    HCT 46.1 (*)    Neutro Abs 17.3 (*)    Lymphs Abs 4.9 (*)    All other components within normal limits  I-STAT CG4 LACTIC ACID, ED - Abnormal; Notable for the following components:   Lactic Acid, Venous 7.70 (*)    All other components within normal limits  POCT I-STAT TROPONIN I - Abnormal; Notable for the following components:   Troponin i, poc 7.20 (*)    All other components within normal limits  URINE CULTURE  COMPREHENSIVE METABOLIC PANEL  LIPASE, BLOOD  BRAIN NATRIURETIC PEPTIDE  URINALYSIS, ROUTINE W REFLEX MICROSCOPIC  BLOOD GAS, ARTERIAL  I-STAT TROPONIN, ED   ____________________________________________  EKG   EKG Interpretation  Date/Time:  Wednesday May 16 2017 13:59:44 EST Ventricular Rate:  56 PR Interval:    QRS Duration: 101 QT Interval:  433 QTC Calculation: 418 R Axis:   81 Text Interpretation:  Junctional rhythm Borderline right axis deviation Repol abnrm, severe global ischemia (LM/MVD) Baseline wander in lead(s) V6 STEMI Confirmed by Alona Bene [id number]) on 05/06/2017 2:18:52 PM       ____________________________________________  RADIOLOGY  Dg Chest Portable 1 View  Result Date: 05/10/2017 CLINICAL DATA:  Short of breath EXAM: PORTABLE CHEST 1 VIEW COMPARISON:  09/20/2016 FINDINGS: Endotracheal tube in good position. Cardiac enlargement with mild vascular congestion.  Negative for edema. Negative for pneumonia or effusion. IMPRESSION: Cardiac enlargement with mild vascular congestion. Negative for edema or pneumonia. Endotracheal tube in good position. Electronically Signed   By: Marlan Palau M.D.   On: 05/18/2017 14:14    ____________________________________________   PROCEDURES  Procedure(s) performed:   Procedures  CRITICAL CARE Performed by: Maia Plan Total critical care time: 20 minutes Critical care time was exclusive of separately billable procedures and treating other patients. Critical care was necessary to treat or prevent imminent or life-threatening deterioration. Critical care was time spent personally by me on the following activities: development of treatment plan with patient and/or surrogate as well as nursing, discussions with consultants, evaluation of patient's response to treatment, examination of patient, obtaining history from patient or surrogate, ordering and performing treatments and interventions, ordering and review of laboratory studies, ordering and review of radiographic studies, pulse oximetry and re-evaluation of patient's condition.  Alona Bene, MD Emergency Medicine  ____________________________________________   INITIAL IMPRESSION / ASSESSMENT AND PLAN / ED COURSE  Pertinent labs & imaging results that were available during my care of the patient were reviewed by me and considered in my medical decision making (see chart for details).  Patient presents to the emergency department for evaluation after cardiac arrest.  ROSC achieved in the field with EMS.  EKG on arrival shows diffuse ST depressions with elevation in aVR concerning for left main disease.  I activated a code STEMI and discussed the case with Dr. Tresa Endo who agrees with plan to take the patient to the heart catheterization lab.  IV access established.  I updated family in the consultation room regarding the plan for catheterization.  I  appreciate Cardiology assistance with this case.  ____________________________________________  FINAL CLINICAL IMPRESSION(S) / ED DIAGNOSES  Final diagnoses:  Cardiac arrest (HCC)  ST elevation myocardial infarction (STEMI), unspecified artery (HCC)     MEDICATIONS GIVEN DURING THIS VISIT:  Medications  heparin bolus via infusion 4,000 Units ( Intravenous MAR Hold 05/25/2017 1423)  heparin ADULT infusion 100 units/mL (25000 units/236mL sodium chloride 0.45%) (not administered)  sodium chloride 0.9 % bolus 500 mL (500 mLs Intravenous New Bag/Given 05/19/2017 1400)    Note:  This document was prepared using Dragon voice recognition software and may include unintentional dictation errors.  Alona Bene, MD Emergency Medicine    Jaylene Schrom, Arlyss Repress, MD 05/08/2017 [phone]

## 2017-05-16 NOTE — Progress Notes (Signed)
Pt admitted already intubated by EMS w/ 7.0 ETT initially at 29 cm at lip.  S/p initial cxray, MD requested ETT withdrawn 2-3 cm. ETT now at 26 at lip, per MD tube in good position on c-xray.  Pt taken to cath lab via vent w/ no apparent complications and report given to unit RT.  Sat 100%.

## 2017-05-16 NOTE — Consult Note (Addendum)
Requesting: Michele Mansesai PA  Reason for consult right groin hemorrhage  Called by Critical care service to eval bleeding from right groin post cath.  Pt has been in cardiogenic shock and probably also an element of hemodynamic shock with ongoing right groin bleeding for about the last 3 hours.  She had a PEA event recently.  She is on max levophed and dopamine with a BP 60.  Her pH is 7.0  Vitals:   2017-02-27 2145 2017-02-27 2220 2017-02-27 2230 2017-02-27 2245  BP:  (!) 88/62 (!) 70/30   Pulse: (!) 42     Resp: (!) 28 (!) 26 19 (!) 28  Temp: (!) 93.2 F (34 C) (!) 93.2 F (34 C) (!) 93.2 F (34 C)   TempSrc: Bladder     SpO2: (!) 80%     Height:        Right groin continuous stream of bright red blood when pressure not held.  Only way to control bleeding is with someone holding the pannus and an additional person controlling the groin  Last coag INR 5.5 PTT 100 and received K centra    This is combination myocardial event with ongoing right groin bleeding.  I do not believe she would survive operation as she is currently so unstable she may code again any minute.  The family has already spoken with critical care and Dr [place] and made DNR.  I spoke with family and discussed she would most likely not survive operation and if she did not survive to get out of the hospital.  They have opted for comfort care at this point  No intervention at this point  Comfort care  Fabienne Brunsharles Malekai Markwood, MD [facility] Office: [phone] Pager: [phone]

## 2017-05-16 NOTE — Consult Note (Signed)
PULMONARY / CRITICAL CARE MEDICINE   Name: [name] MRN: [id number] DOB: 05/06/1956    ADMISSION DATE:  2017/05/22 CONSULTATION DATE:  2017-05-22  REFERRING MD:  Tresa Endo  CHIEF COMPLAINT:  Cardiac Arrest  HISTORY OF PRESENT ILLNESS:  Pt is encephelopathic; therefore, this HPI is obtained from chart review. [name] is a 61 y.o. female with PMH as outlined below. She was brought to [facility] ED 12/19 after witnessed arrest at her home.  She was apparently sitting in her chair then slumped over before she became unresponsive.  Daughter began CPR on scene, EMS arrived and she was in asystole.  She was given 5 rounds of epi, 300mg  amio, 2mg  narcan, defib x 3 for V.fib before ROSC. She was intubated in the field and was brought to [facility] ED as code STEMI.  EKG concerning for inferior STEMI.  She was taken to the cath lab for urgent PCI by cardiology.  PCCM was called for consideration of targeted temperature management.   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  PAST SURGICAL HISTORY: She  has a past surgical history that includes Cholecystectomy (2007); Tubal ligation (1978); Abdominal hysterectomy (2000); Thyroid cyst excision (1996); Knee arthroscopy; Knee arthroscopy (2000); Replacement total knee bilateral; Carpal tunnel release (2003); Hernia repair (2007); Amputation (04/11/2011); Bone biopsy (04/11/2011); Colonoscopy; Toe amputation (November 2012); and Amputation (Right, 03/10/2016).  Allergies  Allergen Reactions  . Chantix [Varenicline Tartrate] Other (See Comments)    psychotic episodes  . Codeine Nausea Only  . Statins Other (See Comments)     Muscle weakness    No current facility-administered medications on file prior to encounter.    Current Outpatient Medications on File Prior to Encounter  Medication Sig  . aspirin 81 MG chewable tablet Chew 1 tablet (81 mg total) by mouth daily.  . Cyanocobalamin (VITAMIN B 12 PO) Take 5,000 Units by mouth daily.  Marland Kitchen diltiazem (CARDIZEM) 120 MG tablet Take 1 tablet (120 mg total) by mouth daily.  . empagliflozin (JARDIANCE) 25 MG TABS tablet Take 25 mg by mouth daily. (Patient not taking: Reported on 05/14/2017)  . ezetimibe (ZETIA) 10 MG tablet Take 1 tablet (10 mg total) by mouth daily. (Patient not taking: Reported on 05/14/2017)  . furosemide (LASIX) 20 MG tablet Take 1 tablet (20 mg total) by mouth daily.  Marland Kitchen glucose blood test strip Use as instructed  . HUMALOG KWIKPEN 100 UNIT/ML KiwkPen INJECT 20-28 UNITS 3 TIMES DAILY WITH MEALS (Patient taking differently: INJECT 20-28 UNITS 3 TIMES DAILY WITH MEALS pt states she is taking 10-20 units Three times daily.)  . HYDROcodone-acetaminophen (NORCO/VICODIN) 5-325 MG tablet Take 1 tablet 4 (four) times daily as needed by mouth.  Marland Kitchen ibuprofen (ADVIL,MOTRIN) 800 MG tablet Take 1 tablet (800 mg total) by mouth daily. (Patient taking differently: Take 800 mg by mouth 2 (two) times daily as needed for moderate pain. )  . Insulin Glargine (LANTUS SOLOSTAR) 100 UNIT/ML Solostar Pen 30 UNITS TWO TIMES A DAY. MAY TITRATE UP TO 80 UNITS NIGHTLY (Patient taking differently: 30 UNITS TWO TIMES A DAY. MAY TITRATE UP TO 80 UNITS NIGHTLY Pt states she takes 20 units BID.)  . lisinopril (PRINIVIL,ZESTRIL) 5 MG tablet Take 0.5 tablets (2.5 mg total) by mouth daily.  Marland Kitchen  modafinil (PROVIGIL) 200 MG tablet Take 1 tablet (200 mg total) by mouth daily. (Patient not taking: Reported on 05/14/2017)  . Multiple Vitamin (MULTIVITAMIN) tablet Take 1 tablet by mouth daily.  Marland Kitchen. PARoxetine (PAXIL) 20 MG tablet TAKE 4 TABLETS BY MOUTH EVERY MORNING (Patient not taking: Reported on  05/14/2017)  . pregabalin (LYRICA) 200 MG capsule Take 1 capsule (200 mg total) by mouth 2 (two) times daily.    FAMILY HISTORY:  Her indicated that her mother is alive. She indicated that her father is deceased. She indicated that her sister is alive. She indicated that both of her brothers are alive. She indicated that her maternal grandmother is deceased. She indicated that her maternal grandfather is deceased. She indicated that her paternal grandmother is deceased. She indicated that her paternal grandfather is deceased. She indicated that both of her daughters are alive. She indicated that her son is deceased. She indicated that only one of her two maternal aunts is alive. She indicated that her paternal aunt is alive. She indicated that her paternal uncle is alive. She indicated that both of her grandchildren are alive. She indicated that her cousin is alive. She indicated that the status of her neg hx is unknown. She indicated that all of her three others are deceased.   SOCIAL HISTORY: She  reports that she has been smoking cigarettes.  She has a 20.00 pack-year smoking history. she has never used smokeless tobacco. She reports that she does not drink alcohol or use drugs.  REVIEW OF SYSTEMS:   Unable to obtain as pt is encephalopathic.  SUBJECTIVE:  On vent.  VITAL SIGNS: BP (!) 89/69   Pulse (!) 52   Temp (!) 96.2 F (35.7 C) (Temporal)   Resp (!) 35   SpO2 100%   HEMODYNAMICS:    VENTILATOR SETTINGS:    INTAKE / OUTPUT: No intake/output data recorded.   PHYSICAL EXAMINATION: General: Adult female, critically ill. Neuro: Somnolent, does not respond to noxious stimuli. HEENT: [place]/AT. PERRL, sclerae anicteric. ETT in place. Cardiovascular: RRR, no M/R/G.  Lungs: Respirations even and unlabored on vent.  CTA bilaterally, No W/R/R. Abdomen: Obese, BS x 4, soft, NT/ND.  Musculoskeletal: No gross deformities, no edema.  Skin: Intact, warm, no  rashes.   LABS:  BMET Recent Labs  Lab 07/07/16 1401  NA 135  K 3.5  CL 104  CO2 17*  BUN 15  CREATININE 1.07*  GLUCOSE 237*    Electrolytes Recent Labs  Lab 07/07/16 1401  CALCIUM 10.7*    CBC Recent Labs  Lab 07/07/16 1401  WBC 23.3*  HGB 15.7*  HCT 46.1*  PLT 180    Coag's No results for input(s): APTT, INR in the last 168 hours.  Sepsis Markers Recent Labs  Lab 07/07/16 1418  LATICACIDVEN 7.70*    ABG No results for input(s): PHART, PCO2ART, PO2ART in the last 168 hours.  Liver Enzymes Recent Labs  Lab 07/07/16 1401  AST 143*  ALT 90*  ALKPHOS 73  BILITOT 0.7  ALBUMIN 3.4*    Cardiac Enzymes No results for input(s): TROPONINI, PROBNP in the last 168 hours.  Glucose No results for input(s): GLUCAP in the last 168 hours.  Imaging Dg Chest Portable 1 View  Result Date: 05/27/17 CLINICAL DATA:  Short of breath EXAM: PORTABLE CHEST 1 VIEW COMPARISON:  09/20/2016 FINDINGS: Endotracheal tube in good position. Cardiac enlargement with mild vascular congestion. Negative for edema. Negative for pneumonia or effusion. IMPRESSION: Cardiac enlargement with mild vascular  congestion. Negative for edema or pneumonia. Endotracheal tube in good position. Electronically Signed   By: Marlan Palau M.D.   On: 05-25-17 14:14     STUDIES:  CXR 12/19 > mild vascular congestion. Echo 12/19  >  EEG 12/20 >   CULTURES: Blood 12/19 >  Sputum 12/19 >   ANTIBIOTICS: None.  SIGNIFICANT EVENTS: 12/19 > admit.  LINES/TUBES: ETT 12/19 >   DISCUSSION: 61 y.o. female admitted 12/19 with inferior STEMI, taken for emergent PCI (currently undergoing). Will possibly undergo targeted temperature management if head CT is normal.  ASSESSMENT / PLAN:  PULMONARY Acute respiratory failure - s/p cardiac arrest, acute MI  P:   Vent support - 8cc/kg  F/u CXR  F/u ABG Daily SBT post hypothermia   CARDIOVASCULAR Cardiac arrest - 20 mins total downtime  prior to ROSC  ACS/STEMI - inferior MI s/p cath lab VFib  Cardiogenic shock  Bradycardia  Hx HTN P:  Per cardiology  Hypothermia protocol if CT head negative  dopamine for BP support  Trend troponin  Echo  Heparin gtt per pharmacy   Hold home lasix, lisinopril, lasix, cardizem  CVP monitoring  Amiodarone   RENAL AKI  Lactic acidosis  P:   F/u lactate, chem  Check BNP, CVP    GASTROINTESTINAL No active issue  Morbid obesity  P:   NPO   HEMATOLOGIC Leukocytosis  P:  F/u CBC   INFECTIOUS Leukocytosis - s/p arrest  P:   Monitor WBC curve off abx for now  Trend pct  No obvious indication infectious process   ENDOCRINE DM - poorly controlled    P:   SSI  Hold home lantus for now with NPO but will likely need to add some long acting insulin    NEUROLOGIC AMS, post cardiac arrest  P:   RASS goal: -4 Hypothermia protocol if head CT neg  Daily WUA after rewarming    FAMILY  - Updates:  No family seen 12/19 - updated by cardiology  - Inter-disciplinary family meet or Palliative Care meeting due by:  Day 7   CC time: 35 min.   Rutherford Guys, PA - [facility] [place] [facility] Pager: [phone]  or [phone] May 25, 2017, 3:15 PM  Attending Note:  61 year old female with extensive PMH who present to the hospital from home via [facility] after a cardiac arrest where she was noted to be in PEA, received 3 epis then VF with 2 shocks.  Patient was transferred directly to the ED where she was taken to the cath lab as a code STEMI.  Patient was intubated in the field with no medications and PCCM was called to assist with vent management and hypotension.  ROSC after 20 minutes.  On exam, pupils are reactive but sluggish with coarse BS.  Will need a STAT head CT order after cath then if negative will proceed with cooling.  If there is a bleed then will manage medically.  Will f/u post cath and head CT.  The patient is critically  ill with multiple organ systems failure and requires high complexity decision making for assessment and support, frequent evaluation and titration of therapies, application of advanced monitoring technologies and extensive interpretation of multiple databases.   Critical Care Time devoted to patient care services described in this note is  60  Minutes. This time reflects time of care of this signee Dr Koren Bound. This critical care time does not  reflect procedure time, or teaching time or supervisory time of PA/NP/Med student/Med Resident etc but could involve care discussion time.  Alyson ReedyWesam G. Lorieann Argueta, M.D. [facility]. Pager: [phone]. After hours pager: [phone].

## 2017-05-16 NOTE — ED Notes (Signed)
Patient placed on Zoll pads 

## 2017-05-16 NOTE — ED Notes (Signed)
Patient transported to cath lab at this time

## 2017-05-17 ENCOUNTER — Encounter (HOSPITAL_COMMUNITY): Payer: Self-pay | Admitting: Cardiovascular Disease

## 2017-05-17 ENCOUNTER — Telehealth: Payer: Self-pay | Admitting: Cardiovascular Disease

## 2017-05-17 LAB — TYPE AND SCREEN
ABO/RH(D): B POS
ANTIBODY SCREEN: NEGATIVE
UNIT DIVISION: 0
Unit division: 0
Unit division: 0
Unit division: 0
Unit division: 0
Unit division: 0

## 2017-05-17 LAB — BPAM RBC
BLOOD PRODUCT EXPIRATION DATE: 201812222359
BLOOD PRODUCT EXPIRATION DATE: 201812252359
BLOOD PRODUCT EXPIRATION DATE: 201901012359
Blood Product Expiration Date: 201812252359
Blood Product Expiration Date: 201812252359
Blood Product Expiration Date: 201901092359
ISSUE DATE / TIME: 201812170737
ISSUE DATE / TIME: 201812192134
ISSUE DATE / TIME: 201812192134
UNIT TYPE AND RH: 7300
UNIT TYPE AND RH: 9500
UNIT TYPE AND RH: 9500
Unit Type and Rh: 7300
Unit Type and Rh: 9500
Unit Type and Rh: 9500

## 2017-05-17 LAB — TROPONIN I: Troponin I: >65 ng/mL (ref <0.03)

## 2017-05-17 LAB — GLUCOSE, CAPILLARY: GLUCOSE-CAPILLARY: 388 mg/dL — AB (ref 65–99)

## 2017-05-17 LAB — HIV ANTIBODY (ROUTINE TESTING W REFLEX): HIV Screen 4th Generation wRfx: NONREACTIVE

## 2017-05-17 MED FILL — Medication: Qty: 1 | Status: AC

## 2017-05-17 NOTE — Telephone Encounter (Signed)
°  New Prob  Calling to verify Dr. Tresa EndoKelly is the one to sign death certificate.  Please call.

## 2017-05-17 NOTE — Progress Notes (Signed)
Called bedside at approximately 8 PM for cardiac arrest.  Patient 61 year old woman who was admitted to the ICU today after out of hospital cardiac arrest found to have ST elevation MI complicated by need for transvenous pacemaker status post PCI.  Upon my arrival at the bedside ROSC had been obtained.  Excessive bleeding was identified at the site of the femoral sheath which was still in place in the right groin.  Attempts were made to control bleeding which were unsuccessful and the decision was made to remove the femoral sheath.  After removal of the sheath continued direct pressure was maintained   With ongoing bleeding.  After removing the sheath attempts were made to insert an arterial line which was quite difficult due to patient's body habitus and overall poor peripheral perfusion and small vessels on ultrasound.  Eventually a right axillary arterial line was placed.  Throughout attempt to place the arterial line patient developed worsening shock and was treated with norepinephrine epinephrine and dopamine with mild improvement in blood pressure into the 50s.  The patient had a transvenous pacemaker through an introducer sheath in the right groin as well which was not bleeding.  It was noted that the pacemaker was not capturing and not sensing.  The pacemaker was shut off and the patient at that time had an intrinsic rate in the 80s.  While the arterial line was placement was being attempted prior to successful placement in the right upper extremity patient suffered a PEA cardiac arrest where she bradycardic and then lost pulses.  Pulses were regained however she remained bradycardic and transcutaneous pacing was initiated.  While initially successful eventually the patient's spontaneous rate resumed and capture was also an issue in the transition his pacing was stopped.  Throughout the patient continued to have difficult to control bleeding from her femoral artery in the right.  Antiplatelet and  anticoagulant infusions were stopped.  K Centra was administered to attempt to reverse some of the direct thrombin inhibitor.  Additionally she was given 2 units of packed red blood cells.  Hemoglobin prior to the red blood cell transfusion had dropped 4-5 g from admission.  Interventional cardiology was present bedside evaluated the patient for mechanical circulatory support however given inability to control ongoing blood loss and the need for anticoagulation for mechanical circulatory support it was felt to be high risk.  Discussion was had between intensive care cardiology and the patient's husband and daughter and they agreed further intervention would be against the patient's wishes.  At this time she was made DNR vascular surgery was consulted and felt that the patient required surgical intervention for ongoing bleeding from the right femoral artery however felt that the likelihood of survival would be low to 0.  This option of vascular surgery was presented to the family and again they declined.  Given inability to control the blood pressure and ongoing inability to control continued bleeding the husband and the patient's daughter expressed that her wishes were never to be prolonged on life support and that she would not want to continue with therapeutic intent at this time.  Family bedside vasopressor support was removed and the patient was provided comfort until she passed.   Problems: Acute blood loss anemia ST elevation myocardial infarction Cardiogenic shock Cardiac arrest  Upon my evaluation, this patient had a high probability of imminent or life-threatening deterioration due to cardiac arrest  high complexity decision making to assess, manipulate, and support vital organ system failure including vasopressors, invasive mechanical  ventilation, transfusions  I have personally provided 150 minutes of critical care time exclusive of time spent on separately billable procedures and education.  Time includes review and summation of previous medical record, laboratory data, radiology results, independent review of chest x-ray, coordination of care with cardiology, vascular surgery, nurse, respiratory therapy, PA, and monitoring for potential decompensation. Interventions were performed as documented above.  Critical care time is inclusive of face to face time with the patient surrogate decision maker, her husband, in clinical consultation, greater than 50% of which was counseling/coordinating care for cardiac arrest.  Critical care time does not include time remaining attempts at insertion of arterial line.  Condition: deceased  Caro Larocheennis M. Laurence, MD  Critical Care Medicine [facility] Pager: [phone]

## 2017-05-17 NOTE — Telephone Encounter (Signed)
Routed to Dr. Tresa EndoKelly to review and advise.

## 2017-05-17 NOTE — Progress Notes (Addendum)
Pt pronounced dead on 07/22/16 at 0020.  Fanny BienQuandra Tzippy Testerman RN and Laury AxonMelvin Amo RN auscultated heart sounds and lungs for 1min. Asystole noted on the monitor.  Pt family at bedside along with Dr. Cammy CopaLawrenced.  [facility] notified at [employer].  Post mortem care performed, along with eye care for possible donation.

## 2017-05-17 NOTE — Progress Notes (Signed)
Pt expired, fentanyl and versed drips discontinued. 25ml of Versed wasted in sink along with  200ml of fentanyl. Waste witnessed by Pamella PertMelvin Amo RN.

## 2017-05-18 LAB — URINE CULTURE
CULTURE: NO GROWTH
Special Requests: NORMAL

## 2017-05-18 MED FILL — Tirofiban HCl in NaCl 0.9% IV Soln 5 MG/100ML (Base Equiv): INTRAVENOUS | Qty: 100 | Status: AC

## 2017-05-29 DEATH — deceased

## 2017-06-07 ENCOUNTER — Telehealth: Payer: Self-pay | Admitting: Cardiovascular Disease

## 2017-06-07 NOTE — Telephone Encounter (Signed)
20-Oct-2016 - [facility] for Dr. Tresa EndoKelly to sign. Dr. Tresa EndoKelly signed the copy of the death certificate. [facility]eral Home was called and a copy was faxed. [facility] to be sign.  06/05/17 - Received original [facility] of [facility] via mail. [facility], RN for Dr. Tresa EndoKelly to sign.   06/07/17 - Received original copy of death certificate signed by Dr. Tresa EndoKelly. Spoke with funeral home. [facility]

## 2017-07-04 ENCOUNTER — Ambulatory Visit: Payer: 59 | Admitting: Family Medicine
